# Patient Record
Sex: Female | Born: 1980 | Race: Black or African American | Hispanic: No | Marital: Single | Smoking: Never smoker
Health system: Southern US, Community
[De-identification: ages and names within clinical notes are randomized; demographics above are authoritative.]

## PROBLEM LIST (undated history)

## (undated) ENCOUNTER — Inpatient Hospital Stay (HOSPITAL_COMMUNITY): Payer: Self-pay

## (undated) DIAGNOSIS — Z9889 Other specified postprocedural states: Secondary | ICD-10-CM

## (undated) DIAGNOSIS — R51 Headache: Secondary | ICD-10-CM

## (undated) DIAGNOSIS — Z789 Other specified health status: Secondary | ICD-10-CM

## (undated) DIAGNOSIS — R112 Nausea with vomiting, unspecified: Secondary | ICD-10-CM

## (undated) DIAGNOSIS — A048 Other specified bacterial intestinal infections: Secondary | ICD-10-CM

## (undated) DIAGNOSIS — E538 Deficiency of other specified B group vitamins: Secondary | ICD-10-CM

## (undated) HISTORY — PX: COLONOSCOPY: SHX174

## (undated) HISTORY — DX: Deficiency of other specified B group vitamins: E53.8

## (undated) HISTORY — DX: Other specified bacterial intestinal infections: A04.8

## (undated) HISTORY — PX: MULTIPLE TOOTH EXTRACTIONS: SHX2053

## (undated) HISTORY — PX: ESOPHAGOGASTRODUODENOSCOPY: SHX1529

---
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
## 2002-04-28 ENCOUNTER — Emergency Department (HOSPITAL_COMMUNITY): Admission: EM | Admit: 2002-04-28 | Discharge: 2002-04-28 | Payer: Self-pay | Admitting: *Deleted

## 2002-12-05 ENCOUNTER — Inpatient Hospital Stay (HOSPITAL_COMMUNITY): Admission: AD | Admit: 2002-12-05 | Discharge: 2002-12-05 | Payer: Self-pay | Admitting: *Deleted

## 2004-04-30 ENCOUNTER — Inpatient Hospital Stay (HOSPITAL_COMMUNITY): Admission: AD | Admit: 2004-04-30 | Discharge: 2004-04-30 | Payer: Self-pay | Admitting: Obstetrics and Gynecology

## 2004-05-15 ENCOUNTER — Inpatient Hospital Stay (HOSPITAL_COMMUNITY): Admission: RE | Admit: 2004-05-15 | Discharge: 2004-05-15 | Payer: Self-pay | Admitting: Obstetrics and Gynecology

## 2004-06-17 ENCOUNTER — Ambulatory Visit (HOSPITAL_COMMUNITY): Admission: RE | Admit: 2004-06-17 | Discharge: 2004-06-17 | Payer: Self-pay | Admitting: Obstetrics

## 2004-10-09 ENCOUNTER — Inpatient Hospital Stay (HOSPITAL_COMMUNITY): Admission: AD | Admit: 2004-10-09 | Discharge: 2004-10-09 | Payer: Self-pay | Admitting: Obstetrics

## 2004-11-04 ENCOUNTER — Inpatient Hospital Stay (HOSPITAL_COMMUNITY): Admission: AD | Admit: 2004-11-04 | Discharge: 2004-11-04 | Payer: Self-pay | Admitting: Obstetrics

## 2004-12-06 ENCOUNTER — Emergency Department (HOSPITAL_COMMUNITY): Admission: EM | Admit: 2004-12-06 | Discharge: 2004-12-06 | Payer: Self-pay | Admitting: Emergency Medicine

## 2004-12-25 ENCOUNTER — Inpatient Hospital Stay (HOSPITAL_COMMUNITY): Admission: AD | Admit: 2004-12-25 | Discharge: 2004-12-27 | Payer: Self-pay | Admitting: Obstetrics

## 2006-10-08 ENCOUNTER — Emergency Department (HOSPITAL_COMMUNITY): Admission: EM | Admit: 2006-10-08 | Discharge: 2006-10-08 | Payer: Self-pay | Admitting: Emergency Medicine

## 2008-05-03 ENCOUNTER — Emergency Department (HOSPITAL_COMMUNITY): Admission: EM | Admit: 2008-05-03 | Discharge: 2008-05-03 | Payer: Self-pay | Admitting: Family Medicine

## 2008-12-27 ENCOUNTER — Inpatient Hospital Stay (HOSPITAL_COMMUNITY): Admission: AD | Admit: 2008-12-27 | Discharge: 2008-12-27 | Payer: Self-pay | Admitting: Family Medicine

## 2010-02-22 ENCOUNTER — Inpatient Hospital Stay (HOSPITAL_COMMUNITY): Admission: AD | Admit: 2010-02-22 | Discharge: 2010-02-22 | Payer: Self-pay | Admitting: Obstetrics & Gynecology

## 2010-02-22 ENCOUNTER — Ambulatory Visit: Payer: Self-pay | Admitting: Nurse Practitioner

## 2010-03-20 ENCOUNTER — Ambulatory Visit: Payer: Self-pay | Admitting: Obstetrics & Gynecology

## 2010-04-06 ENCOUNTER — Ambulatory Visit: Payer: Self-pay | Admitting: Nurse Practitioner

## 2010-04-06 ENCOUNTER — Inpatient Hospital Stay (HOSPITAL_COMMUNITY): Admission: AD | Admit: 2010-04-06 | Discharge: 2010-04-06 | Payer: Self-pay | Admitting: Obstetrics and Gynecology

## 2010-05-14 ENCOUNTER — Inpatient Hospital Stay (HOSPITAL_COMMUNITY): Admission: AD | Admit: 2010-05-14 | Discharge: 2010-05-14 | Payer: Self-pay | Admitting: Obstetrics & Gynecology

## 2010-09-29 ENCOUNTER — Inpatient Hospital Stay (HOSPITAL_COMMUNITY)
Admission: AD | Admit: 2010-09-29 | Discharge: 2010-09-30 | Payer: Self-pay | Source: Home / Self Care | Attending: Obstetrics | Admitting: Obstetrics

## 2010-10-01 LAB — URINE CULTURE
Colony Count: NO GROWTH
Culture: NO GROWTH

## 2010-10-01 LAB — URINE MICROSCOPIC-ADD ON

## 2010-10-01 LAB — URINALYSIS, ROUTINE W REFLEX MICROSCOPIC
Bilirubin Urine: NEGATIVE
Hgb urine dipstick: NEGATIVE
Ketones, ur: 15 mg/dL — AB
Nitrite: NEGATIVE
Protein, ur: NEGATIVE mg/dL
Specific Gravity, Urine: 1.015 (ref 1.005–1.030)
Urine Glucose, Fasting: NEGATIVE mg/dL
Urobilinogen, UA: 0.2 mg/dL (ref 0.0–1.0)
pH: 5.5 (ref 5.0–8.0)

## 2010-11-09 ENCOUNTER — Inpatient Hospital Stay (HOSPITAL_COMMUNITY)
Admission: AD | Admit: 2010-11-09 | Discharge: 2010-11-09 | Disposition: A | Discharge status: Home / Self Care | Payer: Medicaid Other | Source: Ambulatory Visit | Attending: Obstetrics | Admitting: Obstetrics

## 2010-11-09 DIAGNOSIS — A499 Bacterial infection, unspecified: Secondary | ICD-10-CM

## 2010-11-09 DIAGNOSIS — N760 Acute vaginitis: Secondary | ICD-10-CM

## 2010-11-09 DIAGNOSIS — N949 Unspecified condition associated with female genital organs and menstrual cycle: Secondary | ICD-10-CM | POA: Insufficient documentation

## 2010-11-09 DIAGNOSIS — O2390 Unspecified genitourinary tract infection in pregnancy, unspecified trimester: Secondary | ICD-10-CM

## 2010-11-09 DIAGNOSIS — B9689 Other specified bacterial agents as the cause of diseases classified elsewhere: Secondary | ICD-10-CM | POA: Insufficient documentation

## 2010-11-09 LAB — URINALYSIS, ROUTINE W REFLEX MICROSCOPIC
Bilirubin Urine: NEGATIVE
Glucose, UA: NEGATIVE mg/dL
Hgb urine dipstick: NEGATIVE
Ketones, ur: NEGATIVE mg/dL
Nitrite: NEGATIVE
Protein, ur: NEGATIVE mg/dL
Specific Gravity, Urine: 1.01 (ref 1.005–1.030)
Urobilinogen, UA: 1 mg/dL (ref 0.0–1.0)
pH: 6 (ref 5.0–8.0)

## 2010-11-09 LAB — URINE MICROSCOPIC-ADD ON

## 2010-11-09 LAB — WET PREP, GENITAL
Trich, Wet Prep: NONE SEEN
Yeast Wet Prep HPF POC: NONE SEEN

## 2010-11-10 LAB — GC/CHLAMYDIA PROBE AMP, GENITAL
Chlamydia, DNA Probe: NEGATIVE
GC Probe Amp, Genital: NEGATIVE

## 2010-11-11 LAB — URINE CULTURE
Colony Count: NO GROWTH
Culture  Setup Time: 201203042113
Culture: NO GROWTH

## 2010-11-13 ENCOUNTER — Inpatient Hospital Stay (HOSPITAL_COMMUNITY)
Admission: AD | Admit: 2010-11-13 | Discharge: 2010-11-13 | Disposition: A | Discharge status: Home / Self Care | Payer: Medicaid Other | Source: Ambulatory Visit | Attending: Obstetrics | Admitting: Obstetrics

## 2010-11-13 DIAGNOSIS — O358XX0 Maternal care for other (suspected) fetal abnormality and damage, not applicable or unspecified: Secondary | ICD-10-CM

## 2010-11-20 LAB — WET PREP, GENITAL
Trich, Wet Prep: NONE SEEN
Yeast Wet Prep HPF POC: NONE SEEN

## 2010-11-20 LAB — URINE MICROSCOPIC-ADD ON

## 2010-11-20 LAB — URINALYSIS, ROUTINE W REFLEX MICROSCOPIC
Bilirubin Urine: NEGATIVE
Glucose, UA: NEGATIVE mg/dL
Hgb urine dipstick: NEGATIVE
Ketones, ur: NEGATIVE mg/dL
Nitrite: NEGATIVE
Protein, ur: NEGATIVE mg/dL
Specific Gravity, Urine: 1.01 (ref 1.005–1.030)
Urobilinogen, UA: 0.2 mg/dL (ref 0.0–1.0)
pH: 6.5 (ref 5.0–8.0)

## 2010-11-20 LAB — GC/CHLAMYDIA PROBE AMP, GENITAL
Chlamydia, DNA Probe: NEGATIVE
GC Probe Amp, Genital: NEGATIVE

## 2010-11-22 ENCOUNTER — Inpatient Hospital Stay (HOSPITAL_COMMUNITY)
Admission: AD | Admit: 2010-11-22 | Discharge: 2010-11-24 | DRG: 775 | Disposition: A | Discharge status: Home / Self Care | Payer: Medicaid Other | Source: Ambulatory Visit | Attending: Obstetrics | Admitting: Obstetrics

## 2010-11-22 ENCOUNTER — Inpatient Hospital Stay (HOSPITAL_COMMUNITY)
Admission: AD | Admit: 2010-11-22 | Discharge: 2010-11-22 | Disposition: A | Discharge status: Home / Self Care | Payer: Medicaid Other | Source: Ambulatory Visit | Attending: Obstetrics | Admitting: Obstetrics

## 2010-11-22 DIAGNOSIS — D649 Anemia, unspecified: Secondary | ICD-10-CM | POA: Diagnosis not present

## 2010-11-22 DIAGNOSIS — O9903 Anemia complicating the puerperium: Secondary | ICD-10-CM | POA: Diagnosis not present

## 2010-11-22 DIAGNOSIS — O479 False labor, unspecified: Secondary | ICD-10-CM | POA: Insufficient documentation

## 2010-11-22 LAB — WET PREP, GENITAL
Trich, Wet Prep: NONE SEEN
Yeast Wet Prep HPF POC: NONE SEEN

## 2010-11-22 LAB — URINALYSIS, ROUTINE W REFLEX MICROSCOPIC
Bilirubin Urine: NEGATIVE
Glucose, UA: NEGATIVE mg/dL
Ketones, ur: NEGATIVE mg/dL
Nitrite: NEGATIVE
Protein, ur: NEGATIVE mg/dL
Specific Gravity, Urine: 1.015 (ref 1.005–1.030)
Urobilinogen, UA: 0.2 mg/dL (ref 0.0–1.0)
pH: 6 (ref 5.0–8.0)

## 2010-11-22 LAB — URINE CULTURE
Colony Count: NO GROWTH
Culture: NO GROWTH

## 2010-11-22 LAB — CBC
MCH: 27.1 pg (ref 26.0–34.0)
MCHC: 32.7 g/dL (ref 30.0–36.0)
MCHC: 32.3 g/dL (ref 30.0–36.0)
Platelets: 289 10*3/uL (ref 150–400)
RBC: 4.16 MIL/uL (ref 3.87–5.11)
RDW: 14.4 % (ref 11.5–15.5)

## 2010-11-22 LAB — GC/CHLAMYDIA PROBE AMP, GENITAL
Chlamydia, DNA Probe: NEGATIVE
GC Probe Amp, Genital: NEGATIVE

## 2010-11-22 LAB — POCT PREGNANCY, URINE: Preg Test, Ur: POSITIVE

## 2010-11-22 LAB — URINE MICROSCOPIC-ADD ON

## 2010-11-22 LAB — HCG, QUANTITATIVE, PREGNANCY: hCG, Beta Chain, Quant, S: 24690 m[IU]/mL — ABNORMAL HIGH (ref <5)

## 2010-11-23 LAB — RPR: RPR Ser Ql: NONREACTIVE

## 2010-11-23 LAB — WET PREP, GENITAL

## 2010-11-23 LAB — POCT PREGNANCY, URINE: Preg Test, Ur: NEGATIVE

## 2010-11-23 LAB — URINALYSIS, ROUTINE W REFLEX MICROSCOPIC
Protein, ur: NEGATIVE mg/dL
Urobilinogen, UA: 0.2 mg/dL (ref 0.0–1.0)

## 2010-11-23 LAB — CBC
Platelets: 276 10*3/uL (ref 150–400)
RBC: 3.56 MIL/uL — ABNORMAL LOW (ref 3.87–5.11)
WBC: 18.1 10*3/uL — ABNORMAL HIGH (ref 4.0–10.5)

## 2010-11-23 LAB — URINE MICROSCOPIC-ADD ON

## 2010-11-23 LAB — ABO/RH: ABO/RH(D): O POS

## 2010-11-26 ENCOUNTER — Inpatient Hospital Stay (HOSPITAL_COMMUNITY)
Admission: AD | Admit: 2010-11-26 | Discharge: 2010-11-26 | Disposition: A | Discharge status: Home / Self Care | Payer: Medicaid Other | Source: Ambulatory Visit | Attending: Obstetrics | Admitting: Obstetrics

## 2010-11-26 ENCOUNTER — Inpatient Hospital Stay (HOSPITAL_COMMUNITY): Payer: Medicaid Other

## 2010-11-26 DIAGNOSIS — R1031 Right lower quadrant pain: Secondary | ICD-10-CM | POA: Insufficient documentation

## 2010-11-26 LAB — DIFFERENTIAL
Basophils Absolute: 0 10*3/uL (ref 0.0–0.1)
Basophils Relative: 0 % (ref 0–1)
Eosinophils Absolute: 0.2 10*3/uL (ref 0.0–0.7)
Eosinophils Relative: 1 % (ref 0–5)
Lymphocytes Relative: 15 % (ref 12–46)

## 2010-11-26 LAB — URINALYSIS, ROUTINE W REFLEX MICROSCOPIC
Ketones, ur: 40 mg/dL — AB
Leukocytes, UA: NEGATIVE
Nitrite: NEGATIVE
Protein, ur: NEGATIVE mg/dL
Urobilinogen, UA: 0.2 mg/dL (ref 0.0–1.0)
pH: 6 (ref 5.0–8.0)

## 2010-11-26 LAB — CBC
MCV: 77.8 fL — ABNORMAL LOW (ref 78.0–100.0)
Platelets: 302 10*3/uL (ref 150–400)
RDW: 14.8 % (ref 11.5–15.5)
WBC: 14.9 10*3/uL — ABNORMAL HIGH (ref 4.0–10.5)

## 2010-11-26 MED ORDER — IOHEXOL 300 MG/ML  SOLN
100.0000 mL | Freq: Once | INTRAMUSCULAR | Status: AC | PRN
  Administered 2010-11-26: 100 mL via INTRAVENOUS

## 2010-11-26 NOTE — H&P (Signed)
  [name], Fitzpatrick               ACCOUNT NO.:  [id number]  MEDICAL RECORD NO.:  [id number]           PATIENT TYPE:  I  LOCATION:  9142                          FACILITY:  [facility]  PHYSICIAN:  Roseanna Rainbow, M.D.DATE OF BIRTH:  [birth year]  DATE OF ADMISSION:  11/22/2010 DATE OF DISCHARGE:                             HISTORY & PHYSICAL   CHIEF COMPLAINT:  The patient is a 30 year old para 1 with an estimated date of confinement of December 01, 2010, complaining of contractions.  HISTORY OF PRESENT ILLNESS:  Please see the above.  She denies rupture of membranes.  SOCIAL HISTORY:  She denies any tobacco or ethanol or drug use.  ALLERGIES:  No known drug allergies.  PAST GYNECOLOGIC HISTORY:  Normal triad.  PAST OBSTETRICAL HISTORY:  In 2006, she has delivered of a live born female, birth weight 6 pounds 12 ounces, full-term vaginal delivery, no complications.  PAST MEDICAL HISTORY:  She denies.  PAST SURGICAL HISTORY:  She denies.  FAMILY HISTORY:  Noncontributory.  ANTEPARTUM COURSE:  Onset of care with Dr. Gaynell Face at 14 weeks.  PRENATAL LABORATORY DATA:  Hemoglobin 10.3, hematocrit 32.2, platelets 310,000.  Blood type O positive.  Antibody screen negative.  Sickle cell trait positive.  RPR nonreactive.  Rubella immune.  Hepatitis B surface antigen negative.  HIV nonreactive.  GC and Chlamydia probes negative. Quad screen negative.  1-hour GTT 130.  GBS negative on November 02, 2010.  Ultrasound on July 11, 2010, at 19 weeks 3 days, posterior placenta.  OB risk factors, sickle cell trait, urinary tract infection.  REVIEW OF SYSTEMS:  GU:  Please see the above.  PHYSICAL EXAMINATION:  VITAL SIGNS:  Stable, afebrile.  Fetal heart tracing baseline, 140 beats per minute.  Moderate long-term variability. Tocodynamometer uterine contractions every 5 minutes. GENERAL:  Moderate distress. ABDOMEN:  Gravid.  Sterile vaginal exam, 4-5 cm dilated, bulging bag of water  with the contraction, 80% effaced, vertex at a 0 station.  The membranes were artificially ruptured for clear fluid.  ASSESSMENT:  Primipara at term, late latent labor.  Category one fetal heart tracing.  PLAN:  Admission, expectant management, epidural.  Anticipate a spontaneous vaginal delivery.     Roseanna Rainbow, M.D.     Sylvia Fitzpatrick  D:  11/22/2010  T:  11/23/2010  Job:  [id number]  cc:   Kathreen Cosier, M.D. Fax: [phone]  Electronically Signed by Antionette Char M.D. on 11/26/2010 09:53:48 PM

## 2010-12-04 ENCOUNTER — Inpatient Hospital Stay (INDEPENDENT_AMBULATORY_CARE_PROVIDER_SITE_OTHER)
Admission: RE | Admit: 2010-12-04 | Discharge: 2010-12-04 | Disposition: A | Discharge status: Home / Self Care | Payer: Medicaid Other | Source: Ambulatory Visit | Attending: Emergency Medicine | Admitting: Emergency Medicine

## 2010-12-04 DIAGNOSIS — L509 Urticaria, unspecified: Secondary | ICD-10-CM

## 2010-12-06 ENCOUNTER — Inpatient Hospital Stay (INDEPENDENT_AMBULATORY_CARE_PROVIDER_SITE_OTHER)
Admission: RE | Admit: 2010-12-06 | Discharge: 2010-12-06 | Disposition: A | Discharge status: Home / Self Care | Payer: Medicaid Other | Source: Ambulatory Visit | Attending: Family Medicine | Admitting: Family Medicine

## 2010-12-06 DIAGNOSIS — M79609 Pain in unspecified limb: Secondary | ICD-10-CM

## 2010-12-06 DIAGNOSIS — J029 Acute pharyngitis, unspecified: Secondary | ICD-10-CM

## 2010-12-12 ENCOUNTER — Emergency Department (HOSPITAL_COMMUNITY)
Admission: EM | Admit: 2010-12-12 | Discharge: 2010-12-12 | Disposition: A | Discharge status: Home / Self Care | Payer: Medicaid Other | Attending: Emergency Medicine | Admitting: Emergency Medicine

## 2010-12-12 DIAGNOSIS — M7989 Other specified soft tissue disorders: Secondary | ICD-10-CM | POA: Insufficient documentation

## 2010-12-12 DIAGNOSIS — M2550 Pain in unspecified joint: Secondary | ICD-10-CM | POA: Insufficient documentation

## 2010-12-12 DIAGNOSIS — M25473 Effusion, unspecified ankle: Secondary | ICD-10-CM | POA: Insufficient documentation

## 2010-12-12 DIAGNOSIS — M25476 Effusion, unspecified foot: Secondary | ICD-10-CM | POA: Insufficient documentation

## 2010-12-12 DIAGNOSIS — R070 Pain in throat: Secondary | ICD-10-CM | POA: Insufficient documentation

## 2010-12-12 LAB — CBC
HCT: 30.3 % — ABNORMAL LOW (ref 36.0–46.0)
MCH: 24.8 pg — ABNORMAL LOW (ref 26.0–34.0)
MCV: 75 fL — ABNORMAL LOW (ref 78.0–100.0)
Platelets: 453 10*3/uL — ABNORMAL HIGH (ref 150–400)
RDW: 15.5 % (ref 11.5–15.5)

## 2010-12-12 LAB — URINALYSIS, ROUTINE W REFLEX MICROSCOPIC
Glucose, UA: NEGATIVE mg/dL
Protein, ur: NEGATIVE mg/dL
pH: 7.5 (ref 5.0–8.0)

## 2010-12-12 LAB — POCT I-STAT, CHEM 8
BUN: 8 mg/dL (ref 6–23)
Calcium, Ion: 1.11 mmol/L — ABNORMAL LOW (ref 1.12–1.32)
Chloride: 99 mEq/L (ref 96–112)
Creatinine, Ser: 0.9 mg/dL (ref 0.4–1.2)
Glucose, Bld: 102 mg/dL — ABNORMAL HIGH (ref 70–99)

## 2010-12-12 LAB — URINE MICROSCOPIC-ADD ON

## 2010-12-12 LAB — DIFFERENTIAL
Eosinophils Absolute: 0.2 10*3/uL (ref 0.0–0.7)
Eosinophils Relative: 1 % (ref 0–5)
Lymphocytes Relative: 17 % (ref 12–46)
Lymphs Abs: 2.3 10*3/uL (ref 0.7–4.0)
Monocytes Relative: 7 % (ref 3–12)

## 2010-12-13 LAB — ANTISTREPTOLYSIN O TITER: ASO: 95 IU/mL (ref 0–116)

## 2010-12-17 LAB — URINALYSIS, ROUTINE W REFLEX MICROSCOPIC
Bilirubin Urine: NEGATIVE
Glucose, UA: NEGATIVE mg/dL
Ketones, ur: NEGATIVE mg/dL
Nitrite: NEGATIVE
Specific Gravity, Urine: 1.015 (ref 1.005–1.030)
pH: 6 (ref 5.0–8.0)

## 2010-12-17 LAB — URINE CULTURE: Colony Count: >=100000

## 2010-12-17 LAB — WET PREP, GENITAL: Trich, Wet Prep: NONE SEEN

## 2010-12-17 LAB — GC/CHLAMYDIA PROBE AMP, GENITAL
Chlamydia, DNA Probe: NEGATIVE
GC Probe Amp, Genital: NEGATIVE

## 2010-12-17 LAB — URINE MICROSCOPIC-ADD ON

## 2011-05-06 ENCOUNTER — Encounter: Payer: Self-pay | Admitting: Obstetrics

## 2011-05-28 ENCOUNTER — Inpatient Hospital Stay (INDEPENDENT_AMBULATORY_CARE_PROVIDER_SITE_OTHER)
Admission: RE | Admit: 2011-05-28 | Discharge: 2011-05-28 | Disposition: A | Discharge status: Home / Self Care | Payer: Self-pay | Source: Ambulatory Visit | Attending: Family Medicine | Admitting: Family Medicine

## 2011-05-28 DIAGNOSIS — R6889 Other general symptoms and signs: Secondary | ICD-10-CM

## 2011-05-28 LAB — POCT URINALYSIS DIP (DEVICE)
Bilirubin Urine: NEGATIVE
Nitrite: NEGATIVE
Protein, ur: NEGATIVE mg/dL
pH: 7.5 (ref 5.0–8.0)

## 2011-05-28 LAB — POCT PREGNANCY, URINE: Preg Test, Ur: NEGATIVE

## 2011-05-30 LAB — URINE CULTURE

## 2011-09-09 ENCOUNTER — Encounter (HOSPITAL_COMMUNITY): Payer: Self-pay | Admitting: *Deleted

## 2011-09-09 ENCOUNTER — Inpatient Hospital Stay (HOSPITAL_COMMUNITY)
Admission: AD | Admit: 2011-09-09 | Discharge: 2011-09-09 | Disposition: A | Discharge status: Home / Self Care | Payer: Self-pay | Source: Ambulatory Visit | Attending: Obstetrics and Gynecology | Admitting: Obstetrics and Gynecology

## 2011-09-09 DIAGNOSIS — N949 Unspecified condition associated with female genital organs and menstrual cycle: Secondary | ICD-10-CM | POA: Insufficient documentation

## 2011-09-09 DIAGNOSIS — N926 Irregular menstruation, unspecified: Secondary | ICD-10-CM

## 2011-09-09 HISTORY — DX: Other specified health status: Z78.9

## 2011-09-09 LAB — URINALYSIS, ROUTINE W REFLEX MICROSCOPIC
Nitrite: NEGATIVE
Specific Gravity, Urine: 1.015 (ref 1.005–1.030)
Urobilinogen, UA: 0.2 mg/dL (ref 0.0–1.0)
pH: 6 (ref 5.0–8.0)

## 2011-09-09 LAB — URINE MICROSCOPIC-ADD ON

## 2011-09-09 NOTE — Progress Notes (Signed)
Patient states she has had BV in the past. Started having a vaginal discharge with odor and an odor to her urine on 12-28. Patient denies any pain.

## 2011-09-09 NOTE — ED Provider Notes (Signed)
History     Chief Complaint  Patient presents with  . Vaginal Discharge   HPI Sylvia Fitzpatrick 31 y.o. LMP 09-07-11.  Comes to MAU today as her urine has an odor.  Is bleeding today.  Denies abdominal pain and dysuria.  Depo was due in December but did not get it again as she "is not doing anything right now".  OB History    Grav Para Term Preterm Abortions TAB SAB Ect Mult Living   2 2 2  0 0 0 0 0 0 2      Past Medical History  Diagnosis Date  . No pertinent past medical history     Past Surgical History  Procedure Date  . No past surgeries     History reviewed. No pertinent family history.  History  Substance Use Topics  . Smoking status: Never Smoker   . Smokeless tobacco: Not on file  . Alcohol Use: No    Allergies: No Known Allergies  Prescriptions prior to admission  Medication Sig Dispense Refill  . Multiple Vitamin (MULITIVITAMIN WITH MINERALS) TABS Take 1 tablet by mouth daily.          Review of Systems  Gastrointestinal: Negative for abdominal pain.  Genitourinary: Negative for dysuria.       Odor in urine Vaginal bleeding - on menses   Physical Exam   Blood pressure 105/70, pulse 79, temperature 98.7 F (37.1 C), temperature source Oral, resp. rate 16, height 5' 8.5" (1.74 m), weight 160 lb (72.576 kg), last menstrual period 09/07/2011, SpO2 99.00%.  Physical Exam  Nursing note and vitals reviewed. Constitutional: She is oriented to person, place, and time. She appears well-developed and well-nourished.  HENT:  Head: Normocephalic.  Eyes: EOM are normal.  Neck: Neck supple.  GI: Soft. There is no tenderness.  Genitourinary:       Speculum exam: Vagina - Small amount of dark blood noted, no odor Cervix - No contact bleeding Bimanual exam: Cervix closed Uterus non tender, normal size Adnexa non tender, no masses bilaterally No tenderness over bladder GC/Chlam, wet prep done Chaperone present for exam.  Musculoskeletal: Normal range of  motion.  Neurological: She is alert and oriented to person, place, and time.  Skin: Skin is warm and dry.  Psychiatric: She has a normal mood and affect.    MAU Course  Procedures  MDM Results for orders placed during the hospital encounter of 09/09/11 (from the past 24 hour(s))  URINALYSIS, ROUTINE W REFLEX MICROSCOPIC     Status: Abnormal   Collection Time   09/09/11  3:55 PM      Component Value Range   Color, Urine YELLOW  YELLOW    APPearance CLEAR  CLEAR    Specific Gravity, Urine 1.015  1.005 - 1.030    pH 6.0  5.0 - 8.0    Glucose, UA NEGATIVE  NEGATIVE (mg/dL)   Hgb urine dipstick LARGE (*) NEGATIVE    Bilirubin Urine NEGATIVE  NEGATIVE    Ketones, ur NEGATIVE  NEGATIVE (mg/dL)   Protein, ur NEGATIVE  NEGATIVE (mg/dL)   Urobilinogen, UA 0.2  0.0 - 1.0 (mg/dL)   Nitrite NEGATIVE  NEGATIVE    Leukocytes, UA TRACE (*) NEGATIVE   URINE MICROSCOPIC-ADD ON     Status: Abnormal   Collection Time   09/09/11  3:55 PM      Component Value Range   Squamous Epithelial / LPF FEW (*) RARE    WBC, UA 7-10  <3 (WBC/hpf)  RBC / HPF 3-6  <3 (RBC/hpf)   Bacteria, UA MANY (*) RARE   WET PREP, GENITAL     Status: Normal   Collection Time   09/09/11  5:17 PM      Component Value Range   Yeast, Wet Prep NONE SEEN  NONE SEEN    Trich, Wet Prep NONE SEEN  NONE SEEN    Clue Cells, Wet Prep NONE SEEN  NONE SEEN    WBC, Wet Prep HPF POC NONE SEEN  NONE SEEN      Assessment and Plan  On menses No UTI, No vaginal infection  Plan Drink at least 8 8-oz glasses of water every day. Condoms always for contraception if having intercourse Follow up with the [facility] Dept as needed.  Caillou Minus 09/09/2011, 5:21 PM   Nolene Bernheim, NP 09/09/11 1800

## 2011-09-09 NOTE — Progress Notes (Signed)
Pt in c/o odor with urine, denies any other uti symptoms.  Denies any abnormal discharge.  Denies any pain.

## 2011-09-10 LAB — GC/CHLAMYDIA PROBE AMP, GENITAL
Chlamydia, DNA Probe: NEGATIVE
GC Probe Amp, Genital: NEGATIVE

## 2011-09-14 NOTE — ED Provider Notes (Signed)
Agree with above note.  Sylvia Fitzpatrick 09/14/2011 10:13 AM   

## 2012-06-18 ENCOUNTER — Emergency Department (HOSPITAL_COMMUNITY)
Admission: EM | Admit: 2012-06-18 | Discharge: 2012-06-18 | Disposition: A | Discharge status: Home / Self Care | Payer: Self-pay | Attending: Emergency Medicine | Admitting: Emergency Medicine

## 2012-06-18 ENCOUNTER — Encounter (HOSPITAL_COMMUNITY): Payer: Self-pay | Admitting: Emergency Medicine

## 2012-06-18 DIAGNOSIS — A499 Bacterial infection, unspecified: Secondary | ICD-10-CM | POA: Insufficient documentation

## 2012-06-18 DIAGNOSIS — B9689 Other specified bacterial agents as the cause of diseases classified elsewhere: Secondary | ICD-10-CM | POA: Insufficient documentation

## 2012-06-18 DIAGNOSIS — Z202 Contact with and (suspected) exposure to infections with a predominantly sexual mode of transmission: Secondary | ICD-10-CM | POA: Insufficient documentation

## 2012-06-18 DIAGNOSIS — N390 Urinary tract infection, site not specified: Secondary | ICD-10-CM | POA: Insufficient documentation

## 2012-06-18 DIAGNOSIS — N760 Acute vaginitis: Secondary | ICD-10-CM | POA: Insufficient documentation

## 2012-06-18 LAB — WET PREP, GENITAL

## 2012-06-18 LAB — URINALYSIS, ROUTINE W REFLEX MICROSCOPIC
Bilirubin Urine: NEGATIVE
Nitrite: NEGATIVE
Specific Gravity, Urine: 1.015 (ref 1.005–1.030)
Urobilinogen, UA: 1 mg/dL (ref 0.0–1.0)

## 2012-06-18 LAB — POCT PREGNANCY, URINE: Preg Test, Ur: NEGATIVE

## 2012-06-18 MED ORDER — CEFTRIAXONE SODIUM 250 MG IJ SOLR
250.0000 mg | Freq: Once | INTRAMUSCULAR | Status: AC
  Administered 2012-06-18: 250 mg via INTRAMUSCULAR
  Filled 2012-06-18: qty 250

## 2012-06-18 MED ORDER — AZITHROMYCIN 1 G PO PACK
1.0000 g | PACK | Freq: Once | ORAL | Status: AC
  Administered 2012-06-18: 1 g via ORAL
  Filled 2012-06-18: qty 1

## 2012-06-18 MED ORDER — SULFAMETHOXAZOLE-TRIMETHOPRIM 800-160 MG PO TABS: 1.0000 | ORAL_TABLET | Freq: Two times a day (BID) | ORAL | Status: DC

## 2012-06-18 MED ORDER — METRONIDAZOLE 500 MG PO TABS: 500.0000 mg | ORAL_TABLET | Freq: Two times a day (BID) | ORAL | Status: DC
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
## 2012-06-18 NOTE — ED Provider Notes (Signed)
History     CSN: [id number]  Arrival date & time 06/18/12  4098   First MD Initiated Contact with Patient 06/18/12 0735      Chief Complaint  Patient presents with  . Vaginal Discharge    (Consider location/radiation/quality/duration/timing/severity/associated sxs/prior treatment) Patient is a 31 y.o. female presenting with vaginal discharge. The history is provided by the patient.  Vaginal Discharge   patient complains of vaginal discharge x2 days. States that her female partner has not been using condoms. She is concerned that she may have an ICD. Denies any fever or dysuria. No abdominal pain. No treatment used prior to arrival. No prior history of STDs.  Past Medical History  Diagnosis Date  . No pertinent past medical history     Past Surgical History  Procedure Date  . No past surgeries     History reviewed. No pertinent family history.  History  Substance Use Topics  . Smoking status: Never Smoker   . Smokeless tobacco: Not on file  . Alcohol Use: No    OB History    Grav Para Term Preterm Abortions TAB SAB Ect Mult Living   2 2 2  0 0 0 0 0 0 2      Review of Systems  Genitourinary: Positive for vaginal discharge.  All other systems reviewed and are negative.    Allergies  Review of patient's allergies indicates no known allergies.  Home Medications   Current Outpatient Rx  Name Route Sig Dispense Refill  . ADULT MULTIVITAMIN W/MINERALS CH Oral Take 1 tablet by mouth daily.        BP 115/72  Pulse 65  Temp 98.2 F (36.8 C) (Oral)  Resp 16  Ht 5\' 9"  (1.753 m)  LMP 06/09/2012  Physical Exam  Nursing note and vitals reviewed. Constitutional: She is oriented to person, place, and time. She appears well-developed and well-nourished.  Non-toxic appearance. No distress.  HENT:  Head: Normocephalic and atraumatic.  Eyes: Conjunctivae normal, EOM and lids are normal. Pupils are equal, round, and reactive to light.  Neck: Normal range of motion.  Neck supple. No tracheal deviation present. No mass present.  Cardiovascular: Normal rate, regular rhythm and normal heart sounds.  Exam reveals no gallop.   No murmur heard. Pulmonary/Chest: Effort normal and breath sounds normal. No stridor. No respiratory distress. She has no decreased breath sounds. She has no wheezes. She has no rhonchi. She has no rales.  Abdominal: Soft. Normal appearance and bowel sounds are normal. She exhibits no distension. There is no tenderness. There is no rigidity, no rebound, no guarding and no CVA tenderness.  Genitourinary: Cervix exhibits discharge. Cervix exhibits no motion tenderness.  Musculoskeletal: Normal range of motion. She exhibits no edema and no tenderness.  Neurological: She is alert and oriented to person, place, and time. She has normal strength. No cranial nerve deficit or sensory deficit. GCS eye subscore is 4. GCS verbal subscore is 5. GCS motor subscore is 6.  Skin: Skin is warm and dry. No abrasion and no rash noted.  Psychiatric: She has a normal mood and affect. Her speech is normal and behavior is normal.    ED Course  Procedures (including critical care time)   Labs Reviewed  URINALYSIS, ROUTINE W REFLEX MICROSCOPIC  GC/CHLAMYDIA PROBE AMP, GENITAL  WET PREP, GENITAL  URINE CULTURE   No results found.   No diagnosis found.    MDM  Pt to be tx for bv, uti and pid  Toy Baker, MD 06/18/12 [phone]

## 2012-06-20 LAB — GC/CHLAMYDIA PROBE AMP, GENITAL
Chlamydia, DNA Probe: NEGATIVE
GC Probe Amp, Genital: NEGATIVE

## 2012-06-20 LAB — URINE CULTURE

## 2012-06-21 NOTE — ED Notes (Signed)
+   Urine No sensitivity provided. Chart sent to EDP office for review.

## 2012-06-22 NOTE — ED Notes (Signed)
Notes reviewed by Trixie Dredge. No further treatment needed.

## 2013-06-11 ENCOUNTER — Inpatient Hospital Stay (HOSPITAL_COMMUNITY)
Admission: AD | Admit: 2013-06-11 | Discharge: 2013-06-11 | Disposition: A | Discharge status: Home / Self Care | Payer: 59 | Source: Ambulatory Visit | Attending: Obstetrics & Gynecology | Admitting: Obstetrics & Gynecology

## 2013-06-11 ENCOUNTER — Inpatient Hospital Stay (HOSPITAL_COMMUNITY): Payer: 59

## 2013-06-11 ENCOUNTER — Encounter (HOSPITAL_COMMUNITY): Payer: Self-pay

## 2013-06-11 DIAGNOSIS — M549 Dorsalgia, unspecified: Secondary | ICD-10-CM | POA: Diagnosis not present

## 2013-06-11 DIAGNOSIS — N949 Unspecified condition associated with female genital organs and menstrual cycle: Secondary | ICD-10-CM | POA: Diagnosis not present

## 2013-06-11 DIAGNOSIS — R109 Unspecified abdominal pain: Secondary | ICD-10-CM | POA: Diagnosis present

## 2013-06-11 DIAGNOSIS — O9989 Other specified diseases and conditions complicating pregnancy, childbirth and the puerperium: Secondary | ICD-10-CM

## 2013-06-11 DIAGNOSIS — O99891 Other specified diseases and conditions complicating pregnancy: Secondary | ICD-10-CM | POA: Insufficient documentation

## 2013-06-11 HISTORY — DX: Headache: R51

## 2013-06-11 HISTORY — DX: Other specified postprocedural states: Z98.890

## 2013-06-11 HISTORY — DX: Nausea with vomiting, unspecified: R11.2

## 2013-06-11 LAB — URINALYSIS, ROUTINE W REFLEX MICROSCOPIC
Glucose, UA: NEGATIVE mg/dL
Specific Gravity, Urine: 1.015 (ref 1.005–1.030)
pH: 6 (ref 5.0–8.0)

## 2013-06-11 LAB — OB RESULTS CONSOLE GC/CHLAMYDIA
CHLAMYDIA, DNA PROBE: NEGATIVE
Gonorrhea: NEGATIVE

## 2013-06-11 LAB — CBC
MCH: 26.4 pg (ref 26.0–34.0)
Platelets: 306 10*3/uL (ref 150–400)
RBC: 4.01 MIL/uL (ref 3.87–5.11)
WBC: 9.8 10*3/uL (ref 4.0–10.5)

## 2013-06-11 LAB — WET PREP, GENITAL: Clue Cells Wet Prep HPF POC: NONE SEEN

## 2013-06-11 LAB — HCG, QUANTITATIVE, PREGNANCY: hCG, Beta Chain, Quant, S: 6551 m[IU]/mL — ABNORMAL HIGH (ref <5)

## 2013-06-11 LAB — POCT PREGNANCY, URINE: Preg Test, Ur: POSITIVE — AB

## 2013-06-11 MED ORDER — CYCLOBENZAPRINE HCL 10 MG PO TABS: 10.0000 mg | ORAL_TABLET | Freq: Three times a day (TID) | ORAL | Status: DC | PRN

## 2013-06-11 MED ORDER — ACETAMINOPHEN 500 MG PO TABS
1000.0000 mg | ORAL_TABLET | Freq: Once | ORAL | Status: AC
  Administered 2013-06-11: 1000 mg via ORAL
  Filled 2013-06-11: qty 2

## 2013-06-11 NOTE — MAU Note (Signed)
Pt states minimal pain last week, however pain more noticeable since yesterday. Pain is intermittent. Denies bleeding or vag d/c changes.
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
## 2013-06-28 ENCOUNTER — Encounter: Payer: Medicaid Other | Admitting: Obstetrics & Gynecology

## 2013-07-06 ENCOUNTER — Emergency Department (HOSPITAL_COMMUNITY)
Admission: EM | Admit: 2013-07-06 | Discharge: 2013-07-06 | Disposition: A | Discharge status: Home / Self Care | Payer: No Typology Code available for payment source | Attending: Emergency Medicine | Admitting: Emergency Medicine

## 2013-07-06 ENCOUNTER — Encounter (HOSPITAL_COMMUNITY): Payer: Self-pay | Admitting: Emergency Medicine

## 2013-07-06 DIAGNOSIS — Z8669 Personal history of other diseases of the nervous system and sense organs: Secondary | ICD-10-CM | POA: Insufficient documentation

## 2013-07-06 DIAGNOSIS — M549 Dorsalgia, unspecified: Secondary | ICD-10-CM

## 2013-07-06 DIAGNOSIS — Z79899 Other long term (current) drug therapy: Secondary | ICD-10-CM | POA: Insufficient documentation

## 2013-07-06 DIAGNOSIS — S3981XA Other specified injuries of abdomen, initial encounter: Secondary | ICD-10-CM | POA: Insufficient documentation

## 2013-07-06 DIAGNOSIS — IMO0002 Reserved for concepts with insufficient information to code with codable children: Secondary | ICD-10-CM | POA: Insufficient documentation

## 2013-07-06 DIAGNOSIS — Y9389 Activity, other specified: Secondary | ICD-10-CM | POA: Insufficient documentation

## 2013-07-06 DIAGNOSIS — Y92410 Unspecified street and highway as the place of occurrence of the external cause: Secondary | ICD-10-CM | POA: Insufficient documentation

## 2013-07-06 DIAGNOSIS — O9989 Other specified diseases and conditions complicating pregnancy, childbirth and the puerperium: Secondary | ICD-10-CM | POA: Insufficient documentation

## 2013-07-06 MED ORDER — ACETAMINOPHEN 325 MG PO TABS
650.0000 mg | ORAL_TABLET | Freq: Once | ORAL | Status: AC
  Administered 2013-07-06: 650 mg via ORAL
  Filled 2013-07-06: qty 2
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
## 2013-07-12 ENCOUNTER — Encounter (HOSPITAL_COMMUNITY): Payer: Self-pay | Admitting: Family

## 2013-07-12 ENCOUNTER — Inpatient Hospital Stay (HOSPITAL_COMMUNITY): Payer: No Typology Code available for payment source

## 2013-07-12 ENCOUNTER — Inpatient Hospital Stay (HOSPITAL_COMMUNITY)
Admission: AD | Admit: 2013-07-12 | Discharge: 2013-07-12 | Disposition: A | Discharge status: Home / Self Care | Payer: No Typology Code available for payment source | Source: Ambulatory Visit | Attending: Obstetrics & Gynecology | Admitting: Obstetrics & Gynecology

## 2013-07-12 DIAGNOSIS — R0602 Shortness of breath: Secondary | ICD-10-CM | POA: Insufficient documentation

## 2013-07-12 DIAGNOSIS — O99891 Other specified diseases and conditions complicating pregnancy: Secondary | ICD-10-CM | POA: Insufficient documentation

## 2013-07-12 DIAGNOSIS — M545 Low back pain, unspecified: Secondary | ICD-10-CM

## 2013-07-12 DIAGNOSIS — IMO0001 Reserved for inherently not codable concepts without codable children: Secondary | ICD-10-CM | POA: Insufficient documentation

## 2013-07-12 DIAGNOSIS — M549 Dorsalgia, unspecified: Secondary | ICD-10-CM | POA: Insufficient documentation

## 2013-07-12 DIAGNOSIS — M7918 Myalgia, other site: Secondary | ICD-10-CM

## 2013-07-12 DIAGNOSIS — R1084 Generalized abdominal pain: Secondary | ICD-10-CM

## 2013-07-12 DIAGNOSIS — R1032 Left lower quadrant pain: Secondary | ICD-10-CM | POA: Insufficient documentation

## 2013-07-12 LAB — URINALYSIS, ROUTINE W REFLEX MICROSCOPIC
Bilirubin Urine: NEGATIVE
Nitrite: NEGATIVE
Specific Gravity, Urine: 1.025 (ref 1.005–1.030)
Urobilinogen, UA: 0.2 mg/dL (ref 0.0–1.0)

## 2013-07-12 LAB — URINE MICROSCOPIC-ADD ON

## 2013-07-12 MED ORDER — OXYCODONE-ACETAMINOPHEN 5-325 MG PO TABS: 1.0000 | ORAL_TABLET | Freq: Four times a day (QID) | ORAL | Status: DC | PRN
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
## 2013-07-12 NOTE — MAU Note (Signed)
Feels short of breath. No pain when taking a deep breath.

## 2013-07-21 ENCOUNTER — Encounter (HOSPITAL_COMMUNITY): Payer: Self-pay

## 2013-07-21 ENCOUNTER — Inpatient Hospital Stay (HOSPITAL_COMMUNITY)
Admission: AD | Admit: 2013-07-21 | Discharge: 2013-07-21 | Disposition: A | Discharge status: Home / Self Care | Payer: 59 | Source: Ambulatory Visit | Attending: Obstetrics and Gynecology | Admitting: Obstetrics and Gynecology

## 2013-07-21 DIAGNOSIS — R51 Headache: Secondary | ICD-10-CM | POA: Insufficient documentation

## 2013-07-21 DIAGNOSIS — R519 Headache, unspecified: Secondary | ICD-10-CM

## 2013-07-21 DIAGNOSIS — R42 Dizziness and giddiness: Secondary | ICD-10-CM | POA: Insufficient documentation

## 2013-07-21 DIAGNOSIS — O99891 Other specified diseases and conditions complicating pregnancy: Secondary | ICD-10-CM | POA: Insufficient documentation

## 2013-07-21 DIAGNOSIS — O210 Mild hyperemesis gravidarum: Secondary | ICD-10-CM | POA: Insufficient documentation

## 2013-07-21 DIAGNOSIS — O219 Vomiting of pregnancy, unspecified: Secondary | ICD-10-CM

## 2013-07-21 DIAGNOSIS — O26891 Other specified pregnancy related conditions, first trimester: Secondary | ICD-10-CM

## 2013-07-21 LAB — URINE MICROSCOPIC-ADD ON

## 2013-07-21 LAB — URINALYSIS, ROUTINE W REFLEX MICROSCOPIC
Bilirubin Urine: NEGATIVE
Hgb urine dipstick: NEGATIVE
Specific Gravity, Urine: 1.01 (ref 1.005–1.030)
Urobilinogen, UA: 0.2 mg/dL (ref 0.0–1.0)

## 2013-07-21 LAB — GLUCOSE, CAPILLARY: Glucose-Capillary: 74 mg/dL (ref 70–99)

## 2013-07-21 MED ORDER — ONDANSETRON 4 MG PO TBDP: 4.0000 mg | ORAL_TABLET | Freq: Three times a day (TID) | ORAL | Status: DC | PRN

## 2013-07-21 MED ORDER — ONDANSETRON 4 MG PO TBDP
4.0000 mg | ORAL_TABLET | Freq: Once | ORAL | Status: AC
  Administered 2013-07-21: 4 mg via ORAL
  Filled 2013-07-21: qty 1

## 2013-07-21 MED ORDER — ACETAMINOPHEN 500 MG PO TABS
1000.0000 mg | ORAL_TABLET | Freq: Once | ORAL | Status: AC
  Administered 2013-07-21: 1000 mg via ORAL
  Filled 2013-07-21: qty 2

## 2013-07-21 MED ORDER — METOCLOPRAMIDE HCL 10 MG PO TABS: 10.0000 mg | ORAL_TABLET | Freq: Once | ORAL | Status: DC

## 2013-07-21 NOTE — MAU Provider Note (Signed)
Attestation of Attending Supervision of Advanced Practitioner (CNM/NP): Evaluation and management procedures were performed by the Advanced Practitioner under my supervision and collaboration.  I have reviewed the Advanced Practitioner's note and chart, and I agree with the management and plan.  Colden Samaras 07/21/2013 2:45 PM

## 2013-07-21 NOTE — MAU Note (Signed)
Pt states since 0630 has been feeling very light headed and nauseated. Has headache on left side only, making eye twitch. Denies bleeding or vag d/c changes. Had not passed out.

## 2013-07-21 NOTE — MAU Provider Note (Signed)
History     CSN: [id number]  Arrival date and time: 07/21/13 1001   First Provider Initiated Contact with Patient 07/21/13 1118      No chief complaint on file.  HPI  [name] is a 32 y.o. female G3P2002 at [redacted]w[redacted]d who presents with nausea, dizziness and HA that started this morning at 0630. She has not taken anything for the HA or the nausea. 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 She does not drink much fluid during the day. She felt like she needed to eat something, however felt nauseated and didn't.  She currently rates her HA pain a 5/10; pt is awake and texting on her cell phone. Pt has a history of migraine headaches. She states that her left eye has been twitching off and off. She does not have any pain in her left eye, denies history of seizures, denies eye twitching in the past.   OB History   Grav Para Term Preterm Abortions TAB SAB Ect Mult Living   3 2 2  0 0 0 0 0 0 2      Past Medical History  Diagnosis Date  . No pertinent past medical history   . Headache(784.0)     Prior hx migraines  . Complication of anesthesia   . PONV (postoperative nausea and vomiting)     n/v after iv anesthetic    Past Surgical History  Procedure Laterality Date  . Multiple tooth extractions      History reviewed. No pertinent family history.  History  Substance Use Topics  . Smoking status: Never Smoker   . Smokeless tobacco: Never Used  . Alcohol Use: No    Allergies: No Known Allergies  Prescriptions prior to admission  Medication Sig Dispense Refill  . Prenatal Vit-Fe Fumarate-FA (MULTIVITAMIN-PRENATAL) 27-0.8 MG TABS tablet Take 1 tablet by mouth daily at 12 noon.       Results for orders placed during the hospital encounter of 07/21/13 (from the past 24 hour(s))  URINALYSIS, ROUTINE W REFLEX MICROSCOPIC     Status: Abnormal   Collection Time    07/21/13 10:41 AM      Result Value Range   Color, Urine YELLOW   YELLOW   APPearance CLEAR  CLEAR   Specific Gravity, Urine 1.010  1.005 - 1.030   pH 6.0  5.0 - 8.0   Glucose, UA NEGATIVE  NEGATIVE mg/dL   Hgb urine dipstick NEGATIVE  NEGATIVE   Bilirubin Urine NEGATIVE  NEGATIVE   Ketones, ur NEGATIVE  NEGATIVE mg/dL   Protein, ur NEGATIVE  NEGATIVE mg/dL   Urobilinogen, UA 0.2  0.0 - 1.0 mg/dL   Nitrite NEGATIVE  NEGATIVE   Leukocytes, UA TRACE (*) NEGATIVE  URINE MICROSCOPIC-ADD ON     Status: Abnormal   Collection Time    07/21/13 10:41 AM      Result Value Range   Squamous Epithelial / LPF FEW (*) RARE   WBC, UA 0-2  <3 WBC/hpf   Bacteria, UA RARE  RARE  GLUCOSE, CAPILLARY     Status: None   Collection Time    07/21/13 11:53 AM      Result Value Range   Glucose-Capillary 74  70 - 99 mg/dL    Review of Systems  Constitutional: Negative for fever and chills.  Gastrointestinal: Positive for nausea and vomiting. Negative for abdominal pain, diarrhea and constipation.  Neurological: Positive for weakness and headaches. Negative  for dizziness.   Physical Exam   Blood pressure 111/69, pulse 75, temperature 98.1 F (36.7 C), temperature source Oral, resp. rate 20, last menstrual period 05/12/2013, SpO2 100.00%. Fetal heart tones by doppler 161 bpm   Physical Exam  Constitutional: She is oriented to person, place, and time. She appears well-developed and well-nourished.  HENT:  Head: Normocephalic.  Eyes: Pupils are equal, round, and reactive to light. Right eye exhibits no discharge. Left eye exhibits no discharge. Left conjunctiva has no hemorrhage. Left eye exhibits normal extraocular motion and no nystagmus.  Occasional muscle spasm of left eye   Neck: Normal range of motion. Neck supple.  Respiratory: Effort normal.  Musculoskeletal: Normal range of motion.  Neurological: She is alert and oriented to person, place, and time. She has normal strength. GCS eye subscore is 4. GCS verbal subscore is 5. GCS motor subscore is 6.  Skin:  Skin is warm.  Psychiatric: Her behavior is normal.    MAU Course  Procedures None  MDM + fht  CBG Zofran ODT Tylenol  Pt rates her headache pain 3/10 following medication   Assessment and Plan  A:  1. Nausea and vomiting in pregnancy   2. Headache in pregnancy, antepartum, first trimester    P: Discharge home RX: Zofran Return to MAU as needed, if symptoms worsen Ok to take tylenol as directed on the bottle.  Follow up with the clinic as scheduled.   Darica Goren IRENE NP  07/21/2013, 1:25 PM

## 2013-07-25 ENCOUNTER — Encounter: Payer: Self-pay | Admitting: Obstetrics and Gynecology

## 2013-07-25 ENCOUNTER — Ambulatory Visit (INDEPENDENT_AMBULATORY_CARE_PROVIDER_SITE_OTHER): Payer: 59 | Admitting: Obstetrics and Gynecology

## 2013-07-25 VITALS — BP 108/67 | Temp 97.5°F

## 2013-07-25 DIAGNOSIS — Z3481 Encounter for supervision of other normal pregnancy, first trimester: Secondary | ICD-10-CM

## 2013-07-25 DIAGNOSIS — Z3480 Encounter for supervision of other normal pregnancy, unspecified trimester: Secondary | ICD-10-CM | POA: Insufficient documentation

## 2013-07-25 LAB — POCT URINALYSIS DIP (DEVICE)
Bilirubin Urine: NEGATIVE
Glucose, UA: NEGATIVE mg/dL
Ketones, ur: NEGATIVE mg/dL
Specific Gravity, Urine: 1.025 (ref 1.005–1.030)
Urobilinogen, UA: 0.2 mg/dL (ref 0.0–1.0)

## 2013-07-25 NOTE — Addendum Note (Signed)
Addended by: Franchot Mimes on: 07/25/2013 10:32 AM   Modules accepted: Orders

## 2013-07-25 NOTE — Progress Notes (Signed)
   Subjective:    NYELLA ECKELS is a [id number] [redacted]w[redacted]d being seen today for her first obstetrical visit.  Her obstetrical history is significant for 2 previous full term pregnancies. Patient does intend to breast feed. Pregnancy history fully reviewed.  Patient reports no complaints.  Filed Vitals:   07/25/13 0815  BP: 108/67  Temp: 97.5 F (36.4 C)    HISTORY: OB History  Gravida Para Term Preterm AB SAB TAB Ectopic Multiple Living  3 2 2  0 0 0 0 0 0 2    # Outcome Date GA Lbr Len/2nd Weight Sex Delivery Anes PTL Lv  3 CUR           2 TRM 11/22/10 [redacted]w[redacted]d  6 lb 11 oz (3.033 kg) M SVD EPI  Y  1 TRM 12/25/04 [redacted]w[redacted]d  6 lb 12 oz (3.062 kg) F SVD EPI  Y     Past Medical History  Diagnosis Date  . No pertinent past medical history   . Headache(784.0)     Prior hx migraines  . Complication of anesthesia   . PONV (postoperative nausea and vomiting)     n/v after iv anesthetic   Past Surgical History  Procedure Laterality Date  . Multiple tooth extractions     History reviewed. No pertinent family history.   Exam    Uterus:     Pelvic Exam:    Perineum: Normal Perineum   Vulva: normal   Vagina:  normal mucosa, normal discharge   pH:    Cervix: closed and long   Adnexa: no mass, fullness, tenderness   Bony Pelvis: android  System: Breast:  normal appearance, no masses or tenderness   Skin: normal coloration and turgor, no rashes    Neurologic: oriented, no focal deficits   Extremities: normal strength, tone, and muscle mass   HEENT extra ocular movement intact   Mouth/Teeth mucous membranes moist, pharynx normal without lesions   Neck supple and no masses   Cardiovascular: regular rate and rhythm   Respiratory:  chest clear, no wheezing, crepitations, rhonchi, normal symmetric air entry   Abdomen: soft, non-tender; bowel sounds normal; no masses,  no organomegaly   Urinary:       Assessment:    Pregnancy: [id number] Patient Active Problem List   Diagnosis Date  Noted  . Supervision of other normal pregnancy 07/25/2013        Plan:     Initial labs drawn. Prenatal vitamins. Problem list reviewed and updated. Genetic Screening discussed First Screen: requested.  Ultrasound discussed; fetal survey: will be ordered at a later visit. Patient desires BTL  Follow up in 4 weeks. 50% of 30 min visit spent on counseling and coordination of care.     Keishaun Hazel 07/25/2013

## 2013-07-25 NOTE — Patient Instructions (Signed)
Pregnancy - First Trimester During sexual intercourse, millions of sperm go into the vagina. Only 1 sperm will penetrate and fertilize the female egg while it is in the Fallopian tube. One week later, the fertilized egg implants into the wall of the uterus. An embryo begins to develop into a baby. At 6 to 8 weeks, the eyes and face are formed and the heartbeat can be seen on ultrasound. At the end of 12 weeks (first trimester), all the baby's organs are formed. Now that you are pregnant, you will want to do everything you can to have a healthy baby. Two of the most important things are to get good prenatal care and follow your caregiver's instructions. Prenatal care is all the medical care you receive before the baby's birth. It is given to prevent, find, and treat problems during the pregnancy and childbirth. PRENATAL EXAMS  During prenatal visits, your weight, blood pressure, and urine are checked. This is done to make sure you are healthy and progressing normally during the pregnancy.  A pregnant woman should gain 25 to 35 pounds during the pregnancy. However, if you are overweight or underweight, your caregiver will advise you regarding your weight.  Your caregiver will ask and answer questions for you.  Blood work, cervical cultures, other necessary tests, and a Pap test are done during your prenatal exams. These tests are done to check on your health and the probable health of your baby. Tests are strongly recommended and done for HIV with your permission. This is the virus that causes AIDS. These tests are done because medicines can be given to help prevent your baby from being born with this infection should you have been infected without knowing it. Blood work is also used to find out your blood type, previous infections, and follow your blood levels (hemoglobin).  Low hemoglobin (anemia) is common during pregnancy. Iron and vitamins are given to help prevent this. Later in the pregnancy,  blood tests for diabetes will be done along with any other tests if any problems develop.  You may need other tests to make sure you and the baby are doing well. CHANGES DURING THE FIRST TRIMESTER  Your body goes through many changes during pregnancy. They vary from person to person. Talk to your caregiver about changes you notice and are concerned about. Changes can include:  Your menstrual period stops.  The egg and sperm carry the genes that determine what you look like. Genes from you and your partner are forming a baby. The female genes determine whether the baby is a boy or a girl.  Your body increases in girth and you may feel bloated.  Feeling sick to your stomach (nauseous) and throwing up (vomiting). If the vomiting is uncontrollable, call your caregiver.  Your breasts will begin to enlarge and become tender.  Your nipples may stick out more and become darker.  The need to urinate more. Painful urination may mean you have a bladder infection.  Tiring easily.  Loss of appetite.  Cravings for certain kinds of food.  At first, you may gain or lose a couple of pounds.  You may have changes in your emotions from day to day (excited to be pregnant or concerned something may go wrong with the pregnancy and baby).  You may have more vivid and strange dreams. HOME CARE INSTRUCTIONS   It is very important to avoid all smoking, alcohol and non-prescribed drugs during your pregnancy. These affect the formation and growth of the baby.   Avoid chemicals while pregnant to ensure the delivery of a healthy infant.  Start your prenatal visits by the 12th week of pregnancy. They are usually scheduled monthly at first, then more often in the last 2 months before delivery. Keep your caregiver's appointments. Follow your caregiver's instructions regarding medicine use, blood and lab tests, exercise, and diet.  During pregnancy, you are providing food for you and your baby. Eat regular,  well-balanced meals. Choose foods such as meat, fish, milk and other low fat dairy products, vegetables, fruits, and whole-grain breads and cereals. Your caregiver will tell you of the ideal weight gain.  You can help morning sickness by keeping soda crackers at the bedside. Eat a couple before arising in the morning. You may want to use the crackers without salt on them.  Eating 4 to 5 small meals rather than 3 large meals a day also may help the nausea and vomiting.  Drinking liquids between meals instead of during meals also seems to help nausea and vomiting.  A physical sexual relationship may be continued throughout pregnancy if there are no other problems. Problems may be early (premature) leaking of amniotic fluid from the membranes, vaginal bleeding, or belly (abdominal) pain.  Exercise regularly if there are no restrictions. Check with your caregiver or physical therapist if you are unsure of the safety of some of your exercises. Greater weight gain will occur in the last 2 trimesters of pregnancy. Exercising will help:  Control your weight.  Keep you in shape.  Prepare you for labor and delivery.  Help you lose your pregnancy weight after you deliver your baby.  Wear a good support or jogging bra for breast tenderness during pregnancy. This may help if worn during sleep too.  Ask when prenatal classes are available. Begin classes when they are offered.  Do not use hot tubs, steam rooms, or saunas.  Wear your seat belt when driving. This protects you and your baby if you are in an accident.  Avoid raw meat, uncooked cheese, cat litter boxes, and soil used by cats throughout the pregnancy. These carry germs that can cause birth defects in the baby.  The first trimester is a good time to visit your dentist for your dental health. Getting your teeth cleaned is okay. Use a softer toothbrush and brush gently during pregnancy.  Ask for help if you have financial, counseling, or  nutritional needs during pregnancy. Your caregiver will be able to offer counseling for these needs as well as refer you for other special needs.  Do not take any medicines or herbs unless told by your caregiver.  Inform your caregiver if there is any mental or physical domestic violence.  Make a list of emergency phone numbers of family, friends, hospital, and police and fire departments.  Write down your questions. Take them to your prenatal visit.  Do not douche.  Do not cross your legs.  If you have to stand for long periods of time, rotate you feet or take small steps in a circle.  You may have more vaginal secretions that may require a sanitary pad. Do not use tampons or scented sanitary pads. MEDICINES AND DRUG USE IN PREGNANCY  Take prenatal vitamins as directed. The vitamin should contain 1 milligram of folic acid. Keep all vitamins out of reach of children. Only a couple vitamins or tablets containing iron may be fatal to a baby or young child when ingested.  Avoid use of all medicines, including herbs, over-the-counter medicines, not   prescribed or suggested by your caregiver. Only take over-the-counter or prescription medicines for pain, discomfort, or fever as directed by your caregiver. Do not use aspirin, ibuprofen, or naproxen unless directed by your caregiver.  Let your caregiver also know about herbs you may be using.  Alcohol is related to a number of birth defects. This includes fetal alcohol syndrome. All alcohol, in any form, should be avoided completely. Smoking will cause low birth rate and premature babies.  Street or illegal drugs are very harmful to the baby. They are absolutely forbidden. A baby born to an addicted mother will be addicted at birth. The baby will go through the same withdrawal an adult does.  Let your caregiver know about any medicines that you have to take and for what reason you take them. SEEK MEDICAL CARE IF:  You have any concerns or  worries during your pregnancy. It is better to call with your questions if you feel they cannot wait, rather than worry about them. SEEK IMMEDIATE MEDICAL CARE IF:   An unexplained oral temperature above 102 F (38.9 C) develops, or as your caregiver suggests.  You have leaking of fluid from the vagina (birth canal). If leaking membranes are suspected, take your temperature and inform your caregiver of this when you call.  There is vaginal spotting or bleeding. Notify your caregiver of the amount and how many pads are used.  You develop a bad smelling vaginal discharge with a change in the color.  You continue to feel sick to your stomach (nauseated) and have no relief from remedies suggested. You vomit blood or coffee ground-like materials.  You lose more than 2 pounds of weight in 1 week.  You gain more than 2 pounds of weight in 1 week and you notice swelling of your face, hands, feet, or legs.  You gain 5 pounds or more in 1 week (even if you do not have swelling of your hands, face, legs, or feet).  You get exposed to [place] measles and have never had them.  You are exposed to fifth disease or chickenpox.  You develop belly (abdominal) pain. Round ligament discomfort is a common non-cancerous (benign) cause of abdominal pain in pregnancy. Your caregiver still must evaluate this.  You develop headache, fever, diarrhea, pain with urination, or shortness of breath.  You fall or are in a car accident or have any kind of trauma.  There is mental or physical violence in your home. Document Released: 08/18/2001 Document Revised: 05/18/2012 Document Reviewed: 02/19/2009 [facility] Patient Information 2014 [place].  Contraception Choices Contraception (birth control) is the use of any methods or devices to prevent pregnancy. Below are some methods to help avoid pregnancy. HORMONAL METHODS   Contraceptive implant This is a thin, plastic tube containing progesterone hormone. It  does not contain estrogen hormone. Your health care provider inserts the tube in the inner part of the upper arm. The tube can remain in place for up to 3 years. After 3 years, the implant must be removed. The implant prevents the ovaries from releasing an egg (ovulation), thickens the cervical mucus to prevent sperm from entering the uterus, and thins the lining of the inside of the uterus.  Progesterone-only injections These injections are given every 3 months by your health care provider to prevent pregnancy. This synthetic progesterone hormone stops the ovaries from releasing eggs. It also thickens cervical mucus and changes the uterine lining. This makes it harder for sperm to survive in the uterus.  Birth  control pills These pills contain estrogen and progesterone hormone. They work by preventing the ovaries from releasing eggs (ovulation). They also cause the cervical mucus to thicken, preventing the sperm from entering the uterus. Birth control pills are prescribed by a health care provider.Birth control pills can also be used to treat heavy periods.  Minipill This type of birth control pill contains only the progesterone hormone. They are taken every day of each month and must be prescribed by your health care provider.  Birth control patch The patch contains hormones similar to those in birth control pills. It must be changed once a week and is prescribed by a health care provider.  Vaginal ring The ring contains hormones similar to those in birth control pills. It is left in the vagina for 3 weeks, removed for 1 week, and then a new one is put back in place. The patient must be comfortable inserting and removing the ring from the vagina.A health care provider's prescription is necessary.  Emergency contraception Emergency contraceptives prevent pregnancy after unprotected sexual intercourse. This pill can be taken right after sex or up to 5 days after unprotected sex. It is most effective  the sooner you take the pills after having sexual intercourse. Most emergency contraceptive pills are available without a prescription. Check with your pharmacist. Do not use emergency contraception as your only form of birth control. BARRIER METHODS   Female condom This is a thin sheath (latex or rubber) that is worn over the penis during sexual intercourse. It can be used with spermicide to increase effectiveness.  Female condom. This is a soft, loose-fitting sheath that is put into the vagina before sexual intercourse.  Diaphragm This is a soft, latex, dome-shaped barrier that must be fitted by a health care provider. It is inserted into the vagina, along with a spermicidal jelly. It is inserted before intercourse. The diaphragm should be left in the vagina for 6 to 8 hours after intercourse.  Cervical cap This is a round, soft, latex or plastic cup that fits over the cervix and must be fitted by a health care provider. The cap can be left in place for up to 48 hours after intercourse.  Sponge This is a soft, circular piece of polyurethane foam. The sponge has spermicide in it. It is inserted into the vagina after wetting it and before sexual intercourse.  Spermicides These are chemicals that kill or block sperm from entering the cervix and uterus. They come in the form of creams, jellies, suppositories, foam, or tablets. They do not require a prescription. They are inserted into the vagina with an applicator before having sexual intercourse. The process must be repeated every time you have sexual intercourse. INTRAUTERINE CONTRACEPTION  Intrauterine device (IUD) This is a T-shaped device that is put in a woman's uterus during a menstrual period to prevent pregnancy. There are 2 types:  Copper IUD This type of IUD is wrapped in copper wire and is placed inside the uterus. Copper makes the uterus and fallopian tubes produce a fluid that kills sperm. It can stay in place for 10 years.  Hormone IUD  This type of IUD contains the hormone progestin (synthetic progesterone). The hormone thickens the cervical mucus and prevents sperm from entering the uterus, and it also thins the uterine lining to prevent implantation of a fertilized egg. The hormone can weaken or kill the sperm that get into the uterus. It can stay in place for 3 5 years, depending on which type of  IUD is used. PERMANENT METHODS OF CONTRACEPTION  Female tubal ligation This is when the woman's fallopian tubes are surgically sealed, tied, or blocked to prevent the egg from traveling to the uterus.  Hysteroscopic sterilization This involves placing a small coil or insert into each fallopian tube. Your doctor uses a technique called hysteroscopy to do the procedure. The device causes scar tissue to form. This results in permanent blockage of the fallopian tubes, so the sperm cannot fertilize the egg. It takes about 3 months after the procedure for the tubes to become blocked. You must use another form of birth control for these 3 months.  Female sterilization This is when the female has the tubes that carry sperm tied off (vasectomy).This blocks sperm from entering the vagina during sexual intercourse. After the procedure, the man can still ejaculate fluid (semen). NATURAL PLANNING METHODS  Natural family planning This is not having sexual intercourse or using a barrier method (condom, diaphragm, cervical cap) on days the woman could become pregnant.  Calendar method This is keeping track of the length of each menstrual cycle and identifying when you are fertile.  Ovulation method This is avoiding sexual intercourse during ovulation.  Symptothermal method This is avoiding sexual intercourse during ovulation, using a thermometer and ovulation symptoms.  Post ovulation method This is timing sexual intercourse after you have ovulated. Regardless of which type or method of contraception you choose, it is important that you use condoms to  protect against the transmission of sexually transmitted infections (STIs). Talk with your health care provider about which form of contraception is most appropriate for you. Document Released: 08/24/2005 Document Revised: 04/26/2013 Document Reviewed: 02/16/2013 [facility] Patient Information 2014 [place].  Breastfeeding Deciding to breastfeed is one of the best choices you can make for you and your baby. A change in hormones during pregnancy causes your breast tissue to grow and increases the number and size of your milk ducts. These hormones also allow proteins, sugars, and fats from your blood supply to make breast milk in your milk-producing glands. Hormones prevent breast milk from being released before your baby is born as well as prompt milk flow after birth. Once breastfeeding has begun, thoughts of your baby, as well as his or her sucking or crying, can stimulate the release of milk from your milk-producing glands.  BENEFITS OF BREASTFEEDING For Your Baby  Your first milk (colostrum) helps your baby's digestive system function better.   There are antibodies in your milk that help your baby fight off infections.   Your baby has a lower incidence of asthma, allergies, and sudden infant death syndrome.   The nutrients in breast milk are better for your baby than infant formulas and are designed uniquely for your baby's needs.   Breast milk improves your baby's brain development.   Your baby is less likely to develop other conditions, such as childhood obesity, asthma, or type 2 diabetes mellitus.  For You   Breastfeeding helps to create a very special bond between you and your baby.   Breastfeeding is convenient. Breast milk is always available at the correct temperature and costs nothing.   Breastfeeding helps to burn calories and helps you lose the weight gained during pregnancy.   Breastfeeding makes your uterus contract to its prepregnancy size faster and slows  bleeding (lochia) after you give birth.   Breastfeeding helps to lower your risk of developing type 2 diabetes mellitus, osteoporosis, and breast or ovarian cancer later in life. SIGNS THAT YOUR  BABY IS HUNGRY Early Signs of Hunger  Increased alertness or activity.  Stretching.  Movement of the head from side to side.  Movement of the head and opening of the mouth when the corner of the mouth or cheek is stroked (rooting).  Increased sucking sounds, smacking lips, cooing, sighing, or squeaking.  Hand-to-mouth movements.  Increased sucking of fingers or hands. Late Signs of Hunger  Fussing.  Intermittent crying. Extreme Signs of Hunger Signs of extreme hunger will require calming and consoling before your baby will be able to breastfeed successfully. Do not wait for the following signs of extreme hunger to occur before you initiate breastfeeding:   Restlessness.  A loud, strong cry.   Screaming. BREASTFEEDING BASICS Breastfeeding Initiation  Find a comfortable place to sit or lie down, with your neck and back well supported.  Place a pillow or rolled up blanket under your baby to bring him or her to the level of your breast (if you are seated). Nursing pillows are specially designed to help support your arms and your baby while you breastfeed.  Make sure that your baby's abdomen is facing your abdomen.   Gently massage your breast. With your fingertips, massage from your chest wall toward your nipple in a circular motion. This encourages milk flow. You may need to continue this action during the feeding if your milk flows slowly.  Support your breast with 4 fingers underneath and your thumb above your nipple. Make sure your fingers are well away from your nipple and your baby's mouth.   Stroke your baby's lips gently with your finger or nipple.   When your baby's mouth is open wide enough, quickly bring your baby to your breast, placing your entire nipple and as  much of the colored area around your nipple (areola) as possible into your baby's mouth.   More areola should be visible above your baby's upper lip than below the lower lip.   Your baby's tongue should be between his or her lower gum and your breast.   Ensure that your baby's mouth is correctly positioned around your nipple (latched). Your baby's lips should create a seal on your breast and be turned out (everted).  It is common for your baby to suck about 2 3 minutes in order to start the flow of breast milk. Latching Teaching your baby how to latch on to your breast properly is very important. An improper latch can cause nipple pain and decreased milk supply for you and poor weight gain in your baby. Also, if your baby is not latched onto your nipple properly, he or she may swallow some air during feeding. This can make your baby fussy. Burping your baby when you switch breasts during the feeding can help to get rid of the air. However, teaching your baby to latch on properly is still the best way to prevent fussiness from swallowing air while breastfeeding. Signs that your baby has successfully latched on to your nipple:    Silent tugging or silent sucking, without causing you pain.   Swallowing heard between every 3 4 sucks.    Muscle movement above and in front of his or her ears while sucking.  Signs that your baby has not successfully latched on to nipple:   Sucking sounds or smacking sounds from your baby while breastfeeding.  Nipple pain. If you think your baby has not latched on correctly, slip your finger into the corner of your baby's mouth to break the suction and place  it between your baby's gums. Attempt breastfeeding initiation again. Signs of Successful Breastfeeding Signs from your baby:   A gradual decrease in the number of sucks or complete cessation of sucking.   Falling asleep.   Relaxation of his or her body.   Retention of a small amount of milk in  his or her mouth.   Letting go of your breast by himself or herself. Signs from you:  Breasts that have increased in firmness, weight, and size 1 3 hours after feeding.   Breasts that are softer immediately after breastfeeding.  Increased milk volume, as well as a change in milk consistency and color by the 5th day of breastfeeding.   Nipples that are not sore, cracked, or bleeding. Signs That Your Baby is Getting Enough Milk  Wetting at least 3 diapers in a 24-hour period. The urine should be clear and pale yellow by age 5 days.  At least 3 stools in a 24-hour period by age 5 days. The stool should be soft and yellow.  At least 3 stools in a 24-hour period by age 7 days. The stool should be seedy and yellow.  No loss of weight greater than 10% of birth weight during the first 3 days of age.  Average weight gain of 4 7 ounces (120 210 mL) per week after age 4 days.  Consistent daily weight gain by age 5 days, without weight loss after the age of 2 weeks. After a feeding, your baby may spit up a small amount. This is common. BREASTFEEDING FREQUENCY AND DURATION Frequent feeding will help you make more milk and can prevent sore nipples and breast engorgement. Breastfeed when you feel the need to reduce the fullness of your breasts or when your baby shows signs of hunger. This is called "breastfeeding on demand." Avoid introducing a pacifier to your baby while you are working to establish breastfeeding (the first 4 6 weeks after your baby is born). After this time you may choose to use a pacifier. Research has shown that pacifier use during the first year of a baby's life decreases the risk of sudden infant death syndrome (SIDS). Allow your baby to feed on each breast as long as he or she wants. Breastfeed until your baby is finished feeding. When your baby unlatches or falls asleep while feeding from the first breast, offer the second breast. Because newborns are often sleepy in the  first few weeks of life, you may need to awaken your baby to get him or her to feed. Breastfeeding times will vary from baby to baby. However, the following rules can serve as a guide to help you ensure that your baby is properly fed:  Newborns (babies 4 weeks of age or younger) may breastfeed every 1 3 hours.  Newborns should not go longer than 3 hours during the day or 5 hours during the night without breastfeeding.  You should breastfeed your baby a minimum of 8 times in a 24-hour period until you begin to introduce solid foods to your baby at around 6 months of age. BREAST MILK PUMPING Pumping and storing breast milk allows you to ensure that your baby is exclusively fed your breast milk, even at times when you are unable to breastfeed. This is especially important if you are going back to work while you are still breastfeeding or when you are not able to be present during feedings. Your lactation consultant can give you guidelines on how long it is safe to store breast   milk.  A breast pump is a machine that allows you to pump milk from your breast into a sterile bottle. The pumped breast milk can then be stored in a refrigerator or freezer. Some breast pumps are operated by hand, while others use electricity. Ask your lactation consultant which type will work best for you. Breast pumps can be purchased, but some hospitals and breastfeeding support groups lease breast pumps on a monthly basis. A lactation consultant can teach you how to hand express breast milk, if you prefer not to use a pump.  CARING FOR YOUR BREASTS WHILE YOU BREASTFEED Nipples can become dry, cracked, and sore while breastfeeding. The following recommendations can help keep your breasts moisturized and healthy:  Avoid using soap on your nipples.   Wear a supportive bra. Although not required, special nursing bras and tank tops are designed to allow access to your breasts for breastfeeding without taking off your entire bra  or top. Avoid wearing underwire style bras or extremely tight bras.  Air dry your nipples for 3 84minutes after each feeding.   Use only cotton bra pads to absorb leaked breast milk. Leaking of breast milk between feedings is normal.   Use lanolin on your nipples after breastfeeding. Lanolin helps to maintain your skin's normal moisture barrier. If you use pure lanolin you do not need to wash it off before feeding your baby again. Pure lanolin is not toxic to your baby. You may also hand express a few drops of breast milk and gently massage that milk into your nipples and allow the milk to air dry. In the first few weeks after giving birth, some women experience extremely full breasts (engorgement). Engorgement can make your breasts feel heavy, warm, and tender to the touch. Engorgement peaks within 3 5 days after you give birth. The following recommendations can help ease engorgement:  Completely empty your breasts while breastfeeding or pumping. You may want to start by applying warm, moist heat (in the shower or with warm water-soaked hand towels) just before feeding or pumping. This increases circulation and helps the milk flow. If your baby does not completely empty your breasts while breastfeeding, pump any extra milk after he or she is finished.  Wear a snug bra (nursing or regular) or tank top for 1 2 days to signal your body to slightly decrease milk production.  Apply ice packs to your breasts, unless this is too uncomfortable for you.  Make sure that your baby is latched on and positioned properly while breastfeeding. If engorgement persists after 48 hours of following these recommendations, contact your health care provider or a Science writer. OVERALL HEALTH CARE RECOMMENDATIONS WHILE BREASTFEEDING  Eat healthy foods. Alternate between meals and snacks, eating 3 of each per day. Because what you eat affects your breast milk, some of the foods may make your baby more irritable  than usual. Avoid eating these foods if you are sure that they are negatively affecting your baby.  Drink milk, fruit juice, and water to satisfy your thirst (about 10 glasses a day).   Rest often, relax, and continue to take your prenatal vitamins to prevent fatigue, stress, and anemia.  Continue breast self-awareness checks.  Avoid chewing and smoking tobacco.  Avoid alcohol and drug use. Some medicines that may be harmful to your baby can pass through breast milk. It is important to ask your health care provider before taking any medicine, including all over-the-counter and prescription medicine as well as vitamin and herbal supplements.  It is possible to become pregnant while breastfeeding. If birth control is desired, ask your health care provider about options that will be safe for your baby. SEEK MEDICAL CARE IF:   You feel like you want to stop breastfeeding or have become frustrated with breastfeeding.  You have painful breasts or nipples.  Your nipples are cracked or bleeding.  Your breasts are red, tender, or warm.  You have a swollen area on either breast.  You have a fever or chills.  You have nausea or vomiting.  You have drainage other than breast milk from your nipples.  Your breasts do not become full before feedings by the 5th day after you give birth.  You feel sad and depressed.  Your baby is too sleepy to eat well.  Your baby is having trouble sleeping.   Your baby is wetting less than 3 diapers in a 24-hour period.  Your baby has less than 3 stools in a 24-hour period.  Your baby's skin or the white part of his or her eyes becomes yellow.   Your baby is not gaining weight by 6 days of age. SEEK IMMEDIATE MEDICAL CARE IF:   Your baby is overly tired (lethargic) and does not want to wake up and feed.  Your baby develops an unexplained fever. Document Released: 08/24/2005 Document Revised: 04/26/2013 Document Reviewed: 02/15/2013 [facility]  Patient Information 2014 [place].

## 2013-07-25 NOTE — Progress Notes (Signed)
Pulse- 86  Edema-feet  Pain/pressure-lower abd Flu vaccine given @ [place] Weight gain 25-35lb New ob packet given

## 2013-07-26 LAB — ALCOHOL METABOLITE (ETG), URINE: Ethyl Glucuronide (EtG): NEGATIVE ng/mL

## 2013-07-26 LAB — PRESCRIPTION MONITORING PROFILE (19 PANEL)
Amphetamine/Meth: NEGATIVE ng/mL
Barbiturate Screen, Urine: NEGATIVE ng/mL
Benzodiazepine Screen, Urine: NEGATIVE ng/mL
Buprenorphine, Urine: NEGATIVE ng/mL
Meperidine, Ur: NEGATIVE ng/mL
Methaqualone: NEGATIVE ng/mL
Nitrites, Initial: NEGATIVE ug/mL
Opiate Screen, Urine: NEGATIVE ng/mL
Propoxyphene: NEGATIVE ng/mL
Tapentadol, urine: NEGATIVE ng/mL
Tramadol Scrn, Ur: NEGATIVE ng/mL
Zolpidem, Urine: NEGATIVE ng/mL
pH, Initial: 6.8 pH (ref 4.5–8.9)

## 2013-07-26 LAB — OBSTETRIC PANEL
Antibody Screen: NEGATIVE
Eosinophils Absolute: 0.1 10*3/uL (ref 0.0–0.7)
Eosinophils Relative: 1 % (ref 0–5)
Hemoglobin: 10.2 g/dL — ABNORMAL LOW (ref 12.0–15.0)
Hepatitis B Surface Ag: NEGATIVE
Lymphocytes Relative: 21 % (ref 12–46)
Lymphs Abs: 1.6 10*3/uL (ref 0.7–4.0)
MCV: 79.2 fL (ref 78.0–100.0)
Monocytes Relative: 8 % (ref 3–12)
Platelets: 317 10*3/uL (ref 150–400)
RBC: 3.8 MIL/uL — ABNORMAL LOW (ref 3.87–5.11)
Rh Type: POSITIVE
Rubella: 1.06 Index — ABNORMAL HIGH (ref <0.90)
WBC: 7.6 10*3/uL (ref 4.0–10.5)

## 2013-07-26 LAB — HIV ANTIBODY (ROUTINE TESTING W REFLEX): HIV: NONREACTIVE

## 2013-07-28 LAB — HEMOGLOBINOPATHY EVALUATION
Hgb A2 Quant: 2.9 % (ref 2.2–3.2)
Hgb F Quant: 0 % (ref 0.0–2.0)
Hgb S Quant: 33.2 % — ABNORMAL HIGH

## 2013-07-28 LAB — CULTURE, OB URINE: Colony Count: 40000

## 2013-07-31 ENCOUNTER — Other Ambulatory Visit: Payer: Self-pay | Admitting: Obstetrics and Gynecology

## 2013-07-31 DIAGNOSIS — Z3682 Encounter for antenatal screening for nuchal translucency: Secondary | ICD-10-CM

## 2013-07-31 DIAGNOSIS — IMO0002 Reserved for concepts with insufficient information to code with codable children: Secondary | ICD-10-CM

## 2013-07-31 DIAGNOSIS — Z0489 Encounter for examination and observation for other specified reasons: Secondary | ICD-10-CM

## 2013-08-01 ENCOUNTER — Ambulatory Visit (HOSPITAL_COMMUNITY)
Admission: RE | Admit: 2013-08-01 | Discharge: 2013-08-01 | Disposition: A | Discharge status: Home / Self Care | Payer: 59 | Source: Ambulatory Visit | Attending: Obstetrics and Gynecology | Admitting: Obstetrics and Gynecology

## 2013-08-01 ENCOUNTER — Other Ambulatory Visit: Payer: Self-pay

## 2013-08-01 DIAGNOSIS — Z3689 Encounter for other specified antenatal screening: Secondary | ICD-10-CM | POA: Insufficient documentation

## 2013-08-01 DIAGNOSIS — O09219 Supervision of pregnancy with history of pre-term labor, unspecified trimester: Secondary | ICD-10-CM | POA: Insufficient documentation

## 2013-08-01 DIAGNOSIS — O351XX0 Maternal care for (suspected) chromosomal abnormality in fetus, not applicable or unspecified: Secondary | ICD-10-CM | POA: Insufficient documentation

## 2013-08-01 DIAGNOSIS — O3510X0 Maternal care for (suspected) chromosomal abnormality in fetus, unspecified, not applicable or unspecified: Secondary | ICD-10-CM | POA: Insufficient documentation

## 2013-08-01 DIAGNOSIS — Z3682 Encounter for antenatal screening for nuchal translucency: Secondary | ICD-10-CM

## 2013-08-02 ENCOUNTER — Encounter: Payer: Self-pay | Admitting: Obstetrics and Gynecology

## 2013-08-13 ENCOUNTER — Encounter (HOSPITAL_COMMUNITY): Payer: Self-pay | Admitting: *Deleted

## 2013-08-13 ENCOUNTER — Inpatient Hospital Stay (HOSPITAL_COMMUNITY)
Admission: AD | Admit: 2013-08-13 | Discharge: 2013-08-13 | Disposition: A | Discharge status: Home / Self Care | Payer: 59 | Source: Ambulatory Visit | Attending: Obstetrics & Gynecology | Admitting: Obstetrics & Gynecology

## 2013-08-13 DIAGNOSIS — M25569 Pain in unspecified knee: Secondary | ICD-10-CM | POA: Insufficient documentation

## 2013-08-13 DIAGNOSIS — O99891 Other specified diseases and conditions complicating pregnancy: Secondary | ICD-10-CM | POA: Insufficient documentation

## 2013-08-13 DIAGNOSIS — M545 Low back pain, unspecified: Secondary | ICD-10-CM | POA: Insufficient documentation

## 2013-08-13 DIAGNOSIS — N949 Unspecified condition associated with female genital organs and menstrual cycle: Secondary | ICD-10-CM

## 2013-08-13 DIAGNOSIS — R109 Unspecified abdominal pain: Secondary | ICD-10-CM | POA: Insufficient documentation

## 2013-08-13 LAB — URINALYSIS, ROUTINE W REFLEX MICROSCOPIC
Nitrite: NEGATIVE
Protein, ur: NEGATIVE mg/dL
Urobilinogen, UA: 0.2 mg/dL (ref 0.0–1.0)

## 2013-08-13 LAB — URINE MICROSCOPIC-ADD ON

## 2013-08-13 MED ORDER — CYCLOBENZAPRINE HCL 10 MG PO TABS: 10.0000 mg | ORAL_TABLET | Freq: Two times a day (BID) | ORAL | Status: DC | PRN

## 2013-08-13 NOTE — MAU Note (Signed)
Measured thigh two inches above knees right: 18.5"  Left 18.75" Measured three inches below knee     Right lower  Leg: 14.5"     Left lower leg: 13.75"

## 2013-08-13 NOTE — MAU Provider Note (Signed)
Attestation of Attending Supervision of Advanced Practitioner (CNM/NP): Evaluation and management procedures were performed by the Advanced Practitioner under my supervision and collaboration.  I have reviewed the Advanced Practitioner's note and chart, and I agree with the management and plan.  HARRAWAY-Kosiba, Marselino Slayton 10:45 PM     

## 2013-08-13 NOTE — MAU Note (Signed)
Right thigh 14.5   Left thigh 14.0" Right lower leg: 18.5"      Left lower leg 18.0"
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
## 2013-08-15 LAB — URINE CULTURE

## 2013-08-24 ENCOUNTER — Encounter: Payer: 59 | Admitting: Obstetrics and Gynecology

## 2013-08-25 ENCOUNTER — Encounter: Payer: 59 | Admitting: Family Medicine

## 2013-09-07 NOTE — L&D Delivery Note (Signed)
Delivery Note At 9:37 PM a viable and healthy female was delivered via Vaginal, Spontaneous Delivery (Presentation: ; Occiput Anterior).  APGAR: 10, 10; weight 6 lb 6 oz (2892 g).   Placenta status: Intact, Spontaneous.  Cord: 3 vessels with the following complications: None.  Cord pH: NA  Anesthesia: Epidural  Episiotomy: None Lacerations: Vaginal;1st degree Suture Repair: 3.0 vicryl rapide Est. Blood Loss (mL): 250  Mom to postpartum.  Baby to Couplet care / Skin to Skin. Placenta to: BS Feeding: Br/Bo Circ: OP Contraception: [place] 02/06/2014, 11:13 PM

## 2013-09-11 ENCOUNTER — Encounter: Payer: Self-pay | Admitting: Obstetrics and Gynecology

## 2013-09-11 ENCOUNTER — Ambulatory Visit (INDEPENDENT_AMBULATORY_CARE_PROVIDER_SITE_OTHER): Payer: 59 | Admitting: Obstetrics and Gynecology

## 2013-09-11 VITALS — BP 108/70 | Temp 97.0°F | Wt 166.2 lb

## 2013-09-11 DIAGNOSIS — Z3482 Encounter for supervision of other normal pregnancy, second trimester: Secondary | ICD-10-CM

## 2013-09-11 DIAGNOSIS — Z3480 Encounter for supervision of other normal pregnancy, unspecified trimester: Secondary | ICD-10-CM
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
## 2013-09-13 ENCOUNTER — Other Ambulatory Visit: Payer: 59

## 2013-09-14 ENCOUNTER — Ambulatory Visit (HOSPITAL_COMMUNITY)
Admission: RE | Admit: 2013-09-14 | Discharge: 2013-09-14 | Disposition: A | Discharge status: Home / Self Care | Payer: 59 | Source: Ambulatory Visit | Attending: Obstetrics and Gynecology | Admitting: Obstetrics and Gynecology

## 2013-09-14 DIAGNOSIS — Z3689 Encounter for other specified antenatal screening: Secondary | ICD-10-CM | POA: Diagnosis present

## 2013-09-14 DIAGNOSIS — Z3482 Encounter for supervision of other normal pregnancy, second trimester: Secondary | ICD-10-CM

## 2013-09-14 LAB — ALPHA FETOPROTEIN, MATERNAL
AFP: 60.4 [IU]/mL
CURR GEST AGE: 19.1 wks.days
MoM for AFP: 1.29
OPEN SPINA BIFIDA: NEGATIVE
Osb Risk: 1:10500 {titer}

## 2013-09-15 ENCOUNTER — Encounter: Payer: Self-pay | Admitting: Obstetrics and Gynecology

## 2013-09-27 ENCOUNTER — Encounter: Payer: Self-pay | Admitting: *Deleted

## 2013-10-10 ENCOUNTER — Ambulatory Visit (INDEPENDENT_AMBULATORY_CARE_PROVIDER_SITE_OTHER): Payer: 59 | Admitting: Family Medicine

## 2013-10-10 VITALS — BP 117/70 | Temp 97.7°F | Wt 173.0 lb

## 2013-10-10 DIAGNOSIS — Z3480 Encounter for supervision of other normal pregnancy, unspecified trimester: Secondary | ICD-10-CM

## 2013-10-10 LAB — POCT URINALYSIS DIP (DEVICE)
Bilirubin Urine: NEGATIVE
GLUCOSE, UA: NEGATIVE mg/dL
Ketones, ur: NEGATIVE mg/dL
NITRITE: NEGATIVE
PROTEIN: NEGATIVE mg/dL
SPECIFIC GRAVITY, URINE: 1.015 (ref 1.005–1.030)
UROBILINOGEN UA: 0.2 mg/dL (ref 0.0–1.0)
pH: 7 (ref 5.0–8.0)

## 2013-10-10 MED ORDER — HYDROXYZINE HCL 10 MG PO TABS: 10.0000 mg | ORAL_TABLET | Freq: Three times a day (TID) | ORAL | Status: DC | PRN

## 2013-10-10 NOTE — Progress Notes (Signed)
P= 84, C/o itching a lot.,denies rash. C/o braxton hicks once in a while, not daily.

## 2013-10-10 NOTE — Progress Notes (Signed)
S: 33 yo G3P2002 @ [redacted]w[redacted]d here for ROBV - some random braxton-hicks. Not painful -itching all over. Tried lotion, not helping. No rashes.  - no lof, vb.  +FM   O: see flowsheet  A/P - doing well overall  -  No concerns  - prn hydroxyzine for itching  - f/u in 3 weeks for 26 week visit

## 2013-10-10 NOTE — Patient Instructions (Signed)
Second Trimester of Pregnancy The second trimester is from week 13 through week 28, months 4 through 6. The second trimester is often a time when you feel your best. Your body has also adjusted to being pregnant, and you begin to feel better physically. Usually, morning sickness has lessened or quit completely, you may have more energy, and you may have an increase in appetite. The second trimester is also a time when the fetus is growing rapidly. At the end of the sixth month, the fetus is about 9 inches long and weighs about 1 pounds. You will likely begin to feel the baby move (quickening) between 18 and 20 weeks of the pregnancy. BODY CHANGES Your body goes through many changes during pregnancy. The changes vary from woman to woman.   Your weight will continue to increase. You will notice your lower abdomen bulging out.  You may begin to get stretch marks on your hips, abdomen, and breasts.  You may develop headaches that can be relieved by medicines approved by your caregiver.  You may urinate more often because the fetus is pressing on your bladder.  You may develop or continue to have heartburn as a result of your pregnancy.  You may develop constipation because certain hormones are causing the muscles that push waste through your intestines to slow down.  You may develop hemorrhoids or swollen, bulging veins (varicose veins).  You may have back pain because of the weight gain and pregnancy hormones relaxing your joints between the bones in your pelvis and as a result of a shift in weight and the muscles that support your balance.  Your breasts will continue to grow and be tender.  Your gums may bleed and may be sensitive to brushing and flossing.  Dark spots or blotches (chloasma, mask of pregnancy) may develop on your face. This will likely fade after the baby is born.  A dark line from your belly button to the pubic area (linea nigra) may appear. This will likely fade after the  baby is born. WHAT TO EXPECT AT YOUR PRENATAL VISITS During a routine prenatal visit:  You will be weighed to make sure you and the fetus are growing normally.  Your blood pressure will be taken.  Your abdomen will be measured to track your baby's growth.  The fetal heartbeat will be listened to.  Any test results from the previous visit will be discussed. Your caregiver may ask you:  How you are feeling.  If you are feeling the baby move.  If you have had any abnormal symptoms, such as leaking fluid, bleeding, severe headaches, or abdominal cramping.  If you have any questions. Other tests that may be performed during your second trimester include:  Blood tests that check for:  Low iron levels (anemia).  Gestational diabetes (between 24 and 28 weeks).  Rh antibodies.  Urine tests to check for infections, diabetes, or protein in the urine.  An ultrasound to confirm the proper growth and development of the baby.  An amniocentesis to check for possible genetic problems.  Fetal screens for spina bifida and Down syndrome. HOME CARE INSTRUCTIONS   Avoid all smoking, herbs, alcohol, and unprescribed drugs. These chemicals affect the formation and growth of the baby.  Follow your caregiver's instructions regarding medicine use. There are medicines that are either safe or unsafe to take during pregnancy.  Exercise only as directed by your caregiver. Experiencing uterine cramps is a good sign to stop exercising.  Continue to eat regular,   healthy meals.  Wear a good support bra for breast tenderness.  Do not use hot tubs, steam rooms, or saunas.  Wear your seat belt at all times when driving.  Avoid raw meat, uncooked cheese, cat litter boxes, and soil used by cats. These carry germs that can cause birth defects in the baby.  Take your prenatal vitamins.  Try taking a stool softener (if your caregiver approves) if you develop constipation. Eat more high-fiber foods,  such as fresh vegetables or fruit and whole grains. Drink plenty of fluids to keep your urine clear or pale yellow.  Take warm sitz baths to soothe any pain or discomfort caused by hemorrhoids. Use hemorrhoid cream if your caregiver approves.  If you develop varicose veins, wear support hose. Elevate your feet for 15 minutes, 3 4 times a day. Limit salt in your diet.  Avoid heavy lifting, wear low heel shoes, and practice good posture.  Rest with your legs elevated if you have leg cramps or low back pain.  Visit your dentist if you have not gone yet during your pregnancy. Use a soft toothbrush to brush your teeth and be gentle when you floss.  A sexual relationship may be continued unless your caregiver directs you otherwise.  Continue to go to all your prenatal visits as directed by your caregiver. SEEK MEDICAL CARE IF:   You have dizziness.  You have mild pelvic cramps, pelvic pressure, or nagging pain in the abdominal area.  You have persistent nausea, vomiting, or diarrhea.  You have a bad smelling vaginal discharge.  You have pain with urination. SEEK IMMEDIATE MEDICAL CARE IF:   You have a fever.  You are leaking fluid from your vagina.  You have spotting or bleeding from your vagina.  You have severe abdominal cramping or pain.  You have rapid weight gain or loss.  You have shortness of breath with chest pain.  You notice sudden or extreme swelling of your face, hands, ankles, feet, or legs.  You have not felt your baby move in over an hour.  You have severe headaches that do not go away with medicine.  You have vision changes. Document Released: 08/18/2001 Document Revised: 04/26/2013 Document Reviewed: 10/25/2012 [employer] Patient Information 2014 [employer].  

## 2013-10-23 ENCOUNTER — Ambulatory Visit (HOSPITAL_COMMUNITY)
Admission: RE | Admit: 2013-10-23 | Discharge: 2013-10-23 | Disposition: A | Discharge status: Home / Self Care | Payer: 59 | Source: Ambulatory Visit | Attending: Obstetrics and Gynecology | Admitting: Obstetrics and Gynecology

## 2013-10-23 DIAGNOSIS — Z3480 Encounter for supervision of other normal pregnancy, unspecified trimester: Secondary | ICD-10-CM

## 2013-10-23 DIAGNOSIS — Z3689 Encounter for other specified antenatal screening: Secondary | ICD-10-CM | POA: Insufficient documentation

## 2013-10-31 ENCOUNTER — Ambulatory Visit (INDEPENDENT_AMBULATORY_CARE_PROVIDER_SITE_OTHER): Payer: 59 | Admitting: Family Medicine

## 2013-10-31 ENCOUNTER — Encounter: Payer: Self-pay | Admitting: Family Medicine

## 2013-10-31 VITALS — BP 117/62 | Temp 97.5°F | Wt 171.1 lb

## 2013-10-31 DIAGNOSIS — L299 Pruritus, unspecified: Secondary | ICD-10-CM

## 2013-10-31 DIAGNOSIS — Z3480 Encounter for supervision of other normal pregnancy, unspecified trimester: Secondary | ICD-10-CM

## 2013-10-31 DIAGNOSIS — Z8742 Personal history of other diseases of the female genital tract: Secondary | ICD-10-CM

## 2013-10-31 LAB — CBC
HEMATOCRIT: 27.8 % — AB (ref 36.0–46.0)
Hemoglobin: 9.2 g/dL — ABNORMAL LOW (ref 12.0–15.0)
MCH: 26 pg (ref 26.0–34.0)
MCHC: 33.1 g/dL (ref 30.0–36.0)
MCV: 78.5 fL (ref 78.0–100.0)
PLATELETS: 315 10*3/uL (ref 150–400)
RBC: 3.54 MIL/uL — ABNORMAL LOW (ref 3.87–5.11)
RDW: 14.2 % (ref 11.5–15.5)
WBC: 14.9 10*3/uL — ABNORMAL HIGH (ref 4.0–10.5)

## 2013-10-31 LAB — POCT URINALYSIS DIP (DEVICE)
Bilirubin Urine: NEGATIVE
Glucose, UA: NEGATIVE mg/dL
KETONES UR: NEGATIVE mg/dL
Nitrite: NEGATIVE
PH: 6.5 (ref 5.0–8.0)
PROTEIN: NEGATIVE mg/dL
Specific Gravity, Urine: 1.015 (ref 1.005–1.030)
UROBILINOGEN UA: 0.2 mg/dL (ref 0.0–1.0)

## 2013-10-31 MED ORDER — FLUCONAZOLE 150 MG PO TABS
ORAL_TABLET | ORAL | Status: DC
Start: 2013-10-31 — End: 2013-12-12

## 2013-10-31 MED ORDER — CETIRIZINE HCL 10 MG PO TABS: 10.0000 mg | ORAL_TABLET | Freq: Every day | ORAL | Status: DC

## 2013-10-31 NOTE — Patient Instructions (Signed)
Second Trimester of Pregnancy The second trimester is from week 13 through week 28, months 4 through 6. The second trimester is often a time when you feel your best. Your body has also adjusted to being pregnant, and you begin to feel better physically. Usually, morning sickness has lessened or quit completely, you may have more energy, and you may have an increase in appetite. The second trimester is also a time when the fetus is growing rapidly. At the end of the sixth month, the fetus is about 9 inches long and weighs about 1 pounds. You will likely begin to feel the baby move (quickening) between 18 and 20 weeks of the pregnancy. BODY CHANGES Your body goes through many changes during pregnancy. The changes vary from woman to woman.   Your weight will continue to increase. You will notice your lower abdomen bulging out.  You may begin to get stretch marks on your hips, abdomen, and breasts.  You may develop headaches that can be relieved by medicines approved by your caregiver.  You may urinate more often because the fetus is pressing on your bladder.  You may develop or continue to have heartburn as a result of your pregnancy.  You may develop constipation because certain hormones are causing the muscles that push waste through your intestines to slow down.  You may develop hemorrhoids or swollen, bulging veins (varicose veins).  You may have back pain because of the weight gain and pregnancy hormones relaxing your joints between the bones in your pelvis and as a result of a shift in weight and the muscles that support your balance.  Your breasts will continue to grow and be tender.  Your gums may bleed and may be sensitive to brushing and flossing.  Dark spots or blotches (chloasma, mask of pregnancy) may develop on your face. This will likely fade after the baby is born.  A dark line from your belly button to the pubic area (linea nigra) may appear. This will likely fade after the  baby is born. WHAT TO EXPECT AT YOUR PRENATAL VISITS During a routine prenatal visit:  You will be weighed to make sure you and the fetus are growing normally.  Your blood pressure will be taken.  Your abdomen will be measured to track your baby's growth.  The fetal heartbeat will be listened to.  Any test results from the previous visit will be discussed. Your caregiver may ask you:  How you are feeling.  If you are feeling the baby move.  If you have had any abnormal symptoms, such as leaking fluid, bleeding, severe headaches, or abdominal cramping.  If you have any questions. Other tests that may be performed during your second trimester include:  Blood tests that check for:  Low iron levels (anemia).  Gestational diabetes (between 24 and 28 weeks).  Rh antibodies.  Urine tests to check for infections, diabetes, or protein in the urine.  An ultrasound to confirm the proper growth and development of the baby.  An amniocentesis to check for possible genetic problems.  Fetal screens for spina bifida and Down syndrome. HOME CARE INSTRUCTIONS   Avoid all smoking, herbs, alcohol, and unprescribed drugs. These chemicals affect the formation and growth of the baby.  Follow your caregiver's instructions regarding medicine use. There are medicines that are either safe or unsafe to take during pregnancy.  Exercise only as directed by your caregiver. Experiencing uterine cramps is a good sign to stop exercising.  Continue to eat regular,   healthy meals.  Wear a good support bra for breast tenderness.  Do not use hot tubs, steam rooms, or saunas.  Wear your seat belt at all times when driving.  Avoid raw meat, uncooked cheese, cat litter boxes, and soil used by cats. These carry germs that can cause birth defects in the baby.  Take your prenatal vitamins.  Try taking a stool softener (if your caregiver approves) if you develop constipation. Eat more high-fiber foods,  such as fresh vegetables or fruit and whole grains. Drink plenty of fluids to keep your urine clear or pale yellow.  Take warm sitz baths to soothe any pain or discomfort caused by hemorrhoids. Use hemorrhoid cream if your caregiver approves.  If you develop varicose veins, wear support hose. Elevate your feet for 15 minutes, 3 4 times a day. Limit salt in your diet.  Avoid heavy lifting, wear low heel shoes, and practice good posture.  Rest with your legs elevated if you have leg cramps or low back pain.  Visit your dentist if you have not gone yet during your pregnancy. Use a soft toothbrush to brush your teeth and be gentle when you floss.  A sexual relationship may be continued unless your caregiver directs you otherwise.  Continue to go to all your prenatal visits as directed by your caregiver. SEEK MEDICAL CARE IF:   You have dizziness.  You have mild pelvic cramps, pelvic pressure, or nagging pain in the abdominal area.  You have persistent nausea, vomiting, or diarrhea.  You have a bad smelling vaginal discharge.  You have pain with urination. SEEK IMMEDIATE MEDICAL CARE IF:   You have a fever.  You are leaking fluid from your vagina.  You have spotting or bleeding from your vagina.  You have severe abdominal cramping or pain.  You have rapid weight gain or loss.  You have shortness of breath with chest pain.  You notice sudden or extreme swelling of your face, hands, ankles, feet, or legs.  You have not felt your baby move in over an hour.  You have severe headaches that do not go away with medicine.  You have vision changes. Document Released: 08/18/2001 Document Revised: 04/26/2013 Document Reviewed: 10/25/2012 [employer] Patient Information 2014 [employer].  

## 2013-10-31 NOTE — Progress Notes (Signed)
P= 88 Edema in feet.  C/o of rash back left side of vagina; denies itchiness but states it does burn occasionally after using the bathroom.  C/o of itchiness all over and states hydroxizine is not working.

## 2013-10-31 NOTE — Progress Notes (Signed)
33 yo G3P2002 @ [redacted]w[redacted]d for ROBV 1) itching  - continued -hydroxizine not helping "all over"  2) burning, itching rash - states started all of a sudden on the left side of her vagina - mostly itches - some burning - no hx of HSV - put vaseline to sooth it - also having some vaginal discharge  O: see flowsheet Left labia with irritated skin and fissure lines with a couple areas of bleeding.  Some mild ulceration.   A/P 1) itching - no concerning findings on exam - will collect bile acids today given itching - trial of zantac also  2) labial lesion Candidal vs. Herpetic - pt adamant it is not herpes and states has had no exposure - wants to be sure prior to getting treatment - rx of diflucan.  - f/u HSV culture and treat if positive given pt preference   3) PTL precautions discussed 28 week labs done today

## 2013-11-01 LAB — RPR

## 2013-11-01 LAB — GLUCOSE TOLERANCE, 1 HOUR (50G) W/O FASTING: Glucose, 1 Hour GTT: 97 mg/dL (ref 70–140)

## 2013-11-01 LAB — HIV ANTIBODY (ROUTINE TESTING W REFLEX): HIV: NONREACTIVE

## 2013-11-02 LAB — BILE ACIDS, TOTAL: Bile Acids Total: 11 umol/L (ref 0–19)

## 2013-11-02 LAB — HERPES SIMPLEX VIRUS CULTURE: Organism ID, Bacteria: NOT DETECTED

## 2013-11-03 ENCOUNTER — Encounter: Payer: Self-pay | Admitting: Family Medicine

## 2013-11-06 ENCOUNTER — Telehealth: Payer: Self-pay

## 2013-11-06 NOTE — Telephone Encounter (Signed)
Pt. Called stating she was returning our call about results. Called pt. And informed her of normal bile acid level but of anemia and the need to take iron supplement BID. Informed pt. She can get this OTC at any pharmacy and that she may see on the label Ferrous Sulfate. Pt. Verbalized understanding and had no further questions or concerns.

## 2013-11-06 NOTE — Telephone Encounter (Signed)
Called pt. No answer. Left message stating we are calling with results please call clinic.

## 2013-11-06 NOTE — Telephone Encounter (Signed)
Message copied by Geanie Logan on Mon Nov 06, 2013  9:09 AM ------      Message from: Kassie Mends      Created: Fri Nov 03, 2013 10:35 AM       Pt with pretty significant anemia. Please let her know to start taking iron twice daily.  Also, her bile acid level is normal.  Will you ladies let her know? Thanks! ------

## 2013-11-07 ENCOUNTER — Encounter: Payer: 59 | Admitting: Advanced Practice Midwife

## 2013-11-24 ENCOUNTER — Telehealth: Payer: Self-pay

## 2013-11-24 NOTE — Telephone Encounter (Signed)
Pt. Called stating "I have questions about my time off and how to go about it" and would like a call back.   Attempted to call pt. No answer. Left message stating we are returning your call, call clinics.

## 2013-11-27 NOTE — Telephone Encounter (Signed)
Called patient no answer- left message that we are trying to return your phone call. If you still have questions or need assistance please call us back at the clinics. Patient has appt tomorrow 3/24

## 2013-11-28 ENCOUNTER — Encounter: Payer: 59 | Admitting: Advanced Practice Midwife

## 2013-11-28 ENCOUNTER — Encounter: Payer: Self-pay | Admitting: Family Medicine

## 2013-11-28 ENCOUNTER — Ambulatory Visit (INDEPENDENT_AMBULATORY_CARE_PROVIDER_SITE_OTHER): Payer: 59 | Admitting: Family Medicine

## 2013-11-28 VITALS — BP 117/69 | Temp 97.5°F | Wt 184.8 lb

## 2013-11-28 DIAGNOSIS — Z3480 Encounter for supervision of other normal pregnancy, unspecified trimester: Secondary | ICD-10-CM

## 2013-11-28 DIAGNOSIS — Z23 Encounter for immunization: Secondary | ICD-10-CM

## 2013-11-28 LAB — POCT URINALYSIS DIP (DEVICE)
Bilirubin Urine: NEGATIVE
Glucose, UA: NEGATIVE mg/dL
HGB URINE DIPSTICK: NEGATIVE
KETONES UR: NEGATIVE mg/dL
Nitrite: NEGATIVE
Protein, ur: NEGATIVE mg/dL
SPECIFIC GRAVITY, URINE: 1.015 (ref 1.005–1.030)
Urobilinogen, UA: 0.2 mg/dL (ref 0.0–1.0)
pH: 6 (ref 5.0–8.0)

## 2013-11-28 MED ORDER — TETANUS-DIPHTH-ACELL PERTUSSIS 5-2.5-18.5 LF-MCG/0.5 IM SUSP: 0.5000 mL | Freq: Once | INTRAMUSCULAR | Status: DC

## 2013-11-28 NOTE — Progress Notes (Signed)
+  FM,  No lof, no vb, occassional BH  No other complaints  Sylvia Fitzpatrick is a 33 y.o. G3P2002 at [redacted]w[redacted]d by R=10 here for ROB visit.  Discussed with Patient:  -Plans to breast feed.  All questions answered. -Continue prenatal vitamins. -Reviewed fetal kick counts Pt to perform daily at a time when the baby is active, lie laterally with both hands on belly in quiet room and count all movements (hiccups, shoulder rolls, obvious kicks, etc); pt is to report to clinic L&D for less than 10 movements felt in a one hour time period-pt told as soon as she counts 10 movements the count is complete.  - Routine precautions discussed (depression, infection s/s).   Patient provided with all pertinent phone numbers for emergencies. - RTC for any VB, regular, painful cramps/ctxs occurring at a rate of >2/10 min, fever (100.5 or higher), n/v/d, any pain that is unresolving or worsening, LOF, decreased fetal movement, CP, SOB, edema - RTC in 2 weeks for next appt.  Problems: Patient Active Problem List   Diagnosis Date Noted  . Supervision of other normal pregnancy 07/25/2013    To Do: 1. Tdap will be given today  [ ]  Vaccines: Flu:  Tdap: today [ ]  BCM: BTL [ ]  Readiness: baby has a place to sleep, car seat, other baby necessities.  Edu: [x ] PTL precautions; [ ]  BF class; [ ]  childbirth class; [ ]   BF counseling;

## 2013-11-28 NOTE — Patient Instructions (Signed)
Third Trimester of Pregnancy The third trimester is from week 29 through week 42, months 7 through 9. The third trimester is a time when the fetus is growing rapidly. At the end of the ninth month, the fetus is about 20 inches in length and weighs 6 10 pounds.  BODY CHANGES Your body goes through many changes during pregnancy. The changes vary from woman to woman.   Your weight will continue to increase. You can expect to gain 25 35 pounds (11 16 kg) by the end of the pregnancy.  You may begin to get stretch marks on your hips, abdomen, and breasts.  You may urinate more often because the fetus is moving lower into your pelvis and pressing on your bladder.  You may develop or continue to have heartburn as a result of your pregnancy.  You may develop constipation because certain hormones are causing the muscles that push waste through your intestines to slow down.  You may develop hemorrhoids or swollen, bulging veins (varicose veins).  You may have pelvic pain because of the weight gain and pregnancy hormones relaxing your joints between the bones in your pelvis. Back aches may result from over exertion of the muscles supporting your posture.  Your breasts will continue to grow and be tender. A yellow discharge may leak from your breasts called colostrum.  Your belly button may stick out.  You may feel short of breath because of your expanding uterus.  You may notice the fetus "dropping," or moving lower in your abdomen.  You may have a bloody mucus discharge. This usually occurs a few days to a week before labor begins.  Your cervix becomes thin and soft (effaced) near your due date. WHAT TO EXPECT AT YOUR PRENATAL EXAMS  You will have prenatal exams every 2 weeks until week 36. Then, you will have weekly prenatal exams. During a routine prenatal visit:  You will be weighed to make sure you and the fetus are growing normally.  Your blood pressure is taken.  Your abdomen will be  measured to track your baby's growth.  The fetal heartbeat will be listened to.  Any test results from the previous visit will be discussed.  You may have a cervical check near your due date to see if you have effaced. At around 36 weeks, your caregiver will check your cervix. At the same time, your caregiver will also perform a test on the secretions of the vaginal tissue. This test is to determine if a type of bacteria, Group B streptococcus, is present. Your caregiver will explain this further. Your caregiver may ask you:  What your birth plan is.  How you are feeling.  If you are feeling the baby move.  If you have had any abnormal symptoms, such as leaking fluid, bleeding, severe headaches, or abdominal cramping.  If you have any questions. Other tests or screenings that may be performed during your third trimester include:  Blood tests that check for low iron levels (anemia).  Fetal testing to check the health, activity level, and growth of the fetus. Testing is done if you have certain medical conditions or if there are problems during the pregnancy. FALSE LABOR You may feel small, irregular contractions that eventually go away. These are called Braxton Hicks contractions, or false labor. Contractions may last for hours, days, or even weeks before true labor sets in. If contractions come at regular intervals, intensify, or become painful, it is best to be seen by your caregiver.  SIGNS OF LABOR   Menstrual-like cramps.  Contractions that are 5 minutes apart or less.  Contractions that start on the top of the uterus and spread down to the lower abdomen and back.  A sense of increased pelvic pressure or back pain.  A watery or bloody mucus discharge that comes from the vagina. If you have any of these signs before the 37th week of pregnancy, call your caregiver right away. You need to go to the hospital to get checked immediately. HOME CARE INSTRUCTIONS   Avoid all  smoking, herbs, alcohol, and unprescribed drugs. These chemicals affect the formation and growth of the baby.  Follow your caregiver's instructions regarding medicine use. There are medicines that are either safe or unsafe to take during pregnancy.  Exercise only as directed by your caregiver. Experiencing uterine cramps is a good sign to stop exercising.  Continue to eat regular, healthy meals.  Wear a good support bra for breast tenderness.  Do not use hot tubs, steam rooms, or saunas.  Wear your seat belt at all times when driving.  Avoid raw meat, uncooked cheese, cat litter boxes, and soil used by cats. These carry germs that can cause birth defects in the baby.  Take your prenatal vitamins.  Try taking a stool softener (if your caregiver approves) if you develop constipation. Eat more high-fiber foods, such as fresh vegetables or fruit and whole grains. Drink plenty of fluids to keep your urine clear or pale yellow.  Take warm sitz baths to soothe any pain or discomfort caused by hemorrhoids. Use hemorrhoid cream if your caregiver approves.  If you develop varicose veins, wear support hose. Elevate your feet for 15 minutes, 3 4 times a day. Limit salt in your diet.  Avoid heavy lifting, wear low heal shoes, and practice good posture.  Rest a lot with your legs elevated if you have leg cramps or low back pain.  Visit your dentist if you have not gone during your pregnancy. Use a soft toothbrush to brush your teeth and be gentle when you floss.  A sexual relationship may be continued unless your caregiver directs you otherwise.  Do not travel far distances unless it is absolutely necessary and only with the approval of your caregiver.  Take prenatal classes to understand, practice, and ask questions about the labor and delivery.  Make a trial run to the hospital.  Pack your hospital bag.  Prepare the baby's nursery.  Continue to go to all your prenatal visits as directed  by your caregiver. SEEK MEDICAL CARE IF:  You are unsure if you are in labor or if your water has broken.  You have dizziness.  You have mild pelvic cramps, pelvic pressure, or nagging pain in your abdominal area.  You have persistent nausea, vomiting, or diarrhea.  You have a bad smelling vaginal discharge.  You have pain with urination. SEEK IMMEDIATE MEDICAL CARE IF:   You have a fever.  You are leaking fluid from your vagina.  You have spotting or bleeding from your vagina.  You have severe abdominal cramping or pain.  You have rapid weight loss or gain.  You have shortness of breath with chest pain.  You notice sudden or extreme swelling of your face, hands, ankles, feet, or legs.  You have not felt your baby move in over an hour.  You have severe headaches that do not go away with medicine.  You have vision changes. Document Released: 08/18/2001 Document Revised: 04/26/2013 Document Reviewed:   You have severe abdominal cramping or pain.   You have rapid weight loss or gain.   You have shortness of breath with chest pain.   You notice sudden or extreme swelling of your face, hands, ankles, feet, or legs.   You have not felt your baby move in over an hour.   You have severe headaches that do not go away with medicine.   You have vision changes.  Document Released: 08/18/2001 Document Revised: 04/26/2013 Document Reviewed: 10/25/2012  [employer] Patient Information 2014 [employer].

## 2013-11-28 NOTE — Progress Notes (Signed)
P= 76 Edema in feet.  C/o of intermittent lower abdominal/pelvic pressure and braxton hicks.  tdap today.

## 2013-12-12 ENCOUNTER — Ambulatory Visit (INDEPENDENT_AMBULATORY_CARE_PROVIDER_SITE_OTHER): Payer: 59 | Admitting: Obstetrics and Gynecology

## 2013-12-12 VITALS — BP 115/69 | Temp 97.7°F | Wt 191.9 lb

## 2013-12-12 DIAGNOSIS — Z3480 Encounter for supervision of other normal pregnancy, unspecified trimester: Secondary | ICD-10-CM

## 2013-12-12 LAB — POCT URINALYSIS DIP (DEVICE)
Bilirubin Urine: NEGATIVE
Glucose, UA: NEGATIVE mg/dL
HGB URINE DIPSTICK: NEGATIVE
Ketones, ur: NEGATIVE mg/dL
Nitrite: NEGATIVE
PH: 7 (ref 5.0–8.0)
PROTEIN: NEGATIVE mg/dL
SPECIFIC GRAVITY, URINE: 1.015 (ref 1.005–1.030)
UROBILINOGEN UA: 0.2 mg/dL (ref 0.0–1.0)

## 2013-12-12 NOTE — Progress Notes (Signed)
P=  C/o worsening pressure /pelvic pain- states not sure if having contractions or not- just knows it hurts.

## 2013-12-12 NOTE — Progress Notes (Signed)
Lower abd pain with walking. Abd tightening a few times a day.  Needs consent for BTL

## 2013-12-12 NOTE — Patient Instructions (Signed)
Third Trimester of Pregnancy The third trimester is from week 29 through week 42, months 7 through 9. The third trimester is a time when the fetus is growing rapidly. At the end of the ninth month, the fetus is about 20 inches in length and weighs 6 10 pounds.  BODY CHANGES Your body goes through many changes during pregnancy. The changes vary from woman to woman.   Your weight will continue to increase. You can expect to gain 25 35 pounds (11 16 kg) by the end of the pregnancy.  You may begin to get stretch marks on your hips, abdomen, and breasts.  You may urinate more often because the fetus is moving lower into your pelvis and pressing on your bladder.  You may develop or continue to have heartburn as a result of your pregnancy.  You may develop constipation because certain hormones are causing the muscles that push waste through your intestines to slow down.  You may develop hemorrhoids or swollen, bulging veins (varicose veins).  You may have pelvic pain because of the weight gain and pregnancy hormones relaxing your joints between the bones in your pelvis. Back aches may result from over exertion of the muscles supporting your posture.  Your breasts will continue to grow and be tender. A yellow discharge may leak from your breasts called colostrum.  Your belly button may stick out.  You may feel short of breath because of your expanding uterus.  You may notice the fetus "dropping," or moving lower in your abdomen.  You may have a bloody mucus discharge. This usually occurs a few days to a week before labor begins.  Your cervix becomes thin and soft (effaced) near your due date. WHAT TO EXPECT AT YOUR PRENATAL EXAMS  You will have prenatal exams every 2 weeks until week 36. Then, you will have weekly prenatal exams. During a routine prenatal visit:  You will be weighed to make sure you and the fetus are growing normally.  Your blood pressure is taken.  Your abdomen will be  measured to track your baby's growth.  The fetal heartbeat will be listened to.  Any test results from the previous visit will be discussed.  You may have a cervical check near your due date to see if you have effaced. At around 36 weeks, your caregiver will check your cervix. At the same time, your caregiver will also perform a test on the secretions of the vaginal tissue. This test is to determine if a type of bacteria, Group B streptococcus, is present. Your caregiver will explain this further. Your caregiver may ask you:  What your birth plan is.  How you are feeling.  If you are feeling the baby move.  If you have had any abnormal symptoms, such as leaking fluid, bleeding, severe headaches, or abdominal cramping.  If you have any questions. Other tests or screenings that may be performed during your third trimester include:  Blood tests that check for low iron levels (anemia).  Fetal testing to check the health, activity level, and growth of the fetus. Testing is done if you have certain medical conditions or if there are problems during the pregnancy. FALSE LABOR You may feel small, irregular contractions that eventually go away. These are called Braxton Hicks contractions, or false labor. Contractions may last for hours, days, or even weeks before true labor sets in. If contractions come at regular intervals, intensify, or become painful, it is best to be seen by your caregiver.  SIGNS OF LABOR   Menstrual-like cramps.  Contractions that are 5 minutes apart or less.  Contractions that start on the top of the uterus and spread down to the lower abdomen and back.  A sense of increased pelvic pressure or back pain.  A watery or bloody mucus discharge that comes from the vagina. If you have any of these signs before the 37th week of pregnancy, call your caregiver right away. You need to go to the hospital to get checked immediately. HOME CARE INSTRUCTIONS   Avoid all  smoking, herbs, alcohol, and unprescribed drugs. These chemicals affect the formation and growth of the baby.  Follow your caregiver's instructions regarding medicine use. There are medicines that are either safe or unsafe to take during pregnancy.  Exercise only as directed by your caregiver. Experiencing uterine cramps is a good sign to stop exercising.  Continue to eat regular, healthy meals.  Wear a good support bra for breast tenderness.  Do not use hot tubs, steam rooms, or saunas.  Wear your seat belt at all times when driving.  Avoid raw meat, uncooked cheese, cat litter boxes, and soil used by cats. These carry germs that can cause birth defects in the baby.  Take your prenatal vitamins.  Try taking a stool softener (if your caregiver approves) if you develop constipation. Eat more high-fiber foods, such as fresh vegetables or fruit and whole grains. Drink plenty of fluids to keep your urine clear or pale yellow.  Take warm sitz baths to soothe any pain or discomfort caused by hemorrhoids. Use hemorrhoid cream if your caregiver approves.  If you develop varicose veins, wear support hose. Elevate your feet for 15 minutes, 3 4 times a day. Limit salt in your diet.  Avoid heavy lifting, wear low heal shoes, and practice good posture.  Rest a lot with your legs elevated if you have leg cramps or low back pain.  Visit your dentist if you have not gone during your pregnancy. Use a soft toothbrush to brush your teeth and be gentle when you floss.  A sexual relationship may be continued unless your caregiver directs you otherwise.  Do not travel far distances unless it is absolutely necessary and only with the approval of your caregiver.  Take prenatal classes to understand, practice, and ask questions about the labor and delivery.  Make a trial run to the hospital.  Pack your hospital bag.  Prepare the baby's nursery.  Continue to go to all your prenatal visits as directed  by your caregiver. SEEK MEDICAL CARE IF:  You are unsure if you are in labor or if your water has broken.  You have dizziness.  You have mild pelvic cramps, pelvic pressure, or nagging pain in your abdominal area.  You have persistent nausea, vomiting, or diarrhea.  You have a bad smelling vaginal discharge.  You have pain with urination. SEEK IMMEDIATE MEDICAL CARE IF:   You have a fever.  You are leaking fluid from your vagina.  You have spotting or bleeding from your vagina.  You have severe abdominal cramping or pain.  You have rapid weight loss or gain.  You have shortness of breath with chest pain.  You notice sudden or extreme swelling of your face, hands, ankles, feet, or legs.  You have not felt your baby move in over an hour.  You have severe headaches that do not go away with medicine.  You have vision changes. Document Released: 08/18/2001 Document Revised: 04/26/2013 Document Reviewed:   You have severe abdominal cramping or pain.   You have rapid weight loss or gain.   You have shortness of breath with chest pain.   You notice sudden or extreme swelling of your face, hands, ankles, feet, or legs.   You have not felt your baby move in over an hour.   You have severe headaches that do not go away with medicine.   You have vision changes.  Document Released: 08/18/2001 Document Revised: 04/26/2013 Document Reviewed: 10/25/2012  [employer] Patient Information 2014 [employer].

## 2013-12-25 ENCOUNTER — Encounter (HOSPITAL_COMMUNITY): Payer: Self-pay | Admitting: *Deleted

## 2013-12-25 ENCOUNTER — Inpatient Hospital Stay (HOSPITAL_COMMUNITY)
Admission: AD | Admit: 2013-12-25 | Discharge: 2013-12-25 | Disposition: A | Discharge status: Home / Self Care | Payer: 59 | Source: Ambulatory Visit | Attending: Obstetrics & Gynecology | Admitting: Obstetrics & Gynecology

## 2013-12-25 DIAGNOSIS — O2390 Unspecified genitourinary tract infection in pregnancy, unspecified trimester: Secondary | ICD-10-CM | POA: Insufficient documentation

## 2013-12-25 DIAGNOSIS — N390 Urinary tract infection, site not specified: Secondary | ICD-10-CM | POA: Insufficient documentation

## 2013-12-25 DIAGNOSIS — O2343 Unspecified infection of urinary tract in pregnancy, third trimester: Secondary | ICD-10-CM

## 2013-12-25 DIAGNOSIS — O4700 False labor before 37 completed weeks of gestation, unspecified trimester: Secondary | ICD-10-CM | POA: Insufficient documentation

## 2013-12-25 LAB — WET PREP, GENITAL
TRICH WET PREP: NONE SEEN
YEAST WET PREP: NONE SEEN

## 2013-12-25 LAB — URINALYSIS, ROUTINE W REFLEX MICROSCOPIC
BILIRUBIN URINE: NEGATIVE
GLUCOSE, UA: NEGATIVE mg/dL
Ketones, ur: NEGATIVE mg/dL
Nitrite: NEGATIVE
PROTEIN: NEGATIVE mg/dL
Specific Gravity, Urine: 1.01 (ref 1.005–1.030)
Urobilinogen, UA: 0.2 mg/dL (ref 0.0–1.0)
pH: 6.5 (ref 5.0–8.0)

## 2013-12-25 LAB — URINE MICROSCOPIC-ADD ON

## 2013-12-25 LAB — AMNISURE RUPTURE OF MEMBRANE (ROM) NOT AT ARMC: AMNISURE: NEGATIVE

## 2013-12-25 LAB — FETAL FIBRONECTIN: Fetal Fibronectin: NEGATIVE

## 2013-12-25 MED ORDER — NITROFURANTOIN MONOHYD MACRO 100 MG PO CAPS: 100.0000 mg | ORAL_CAPSULE | Freq: Two times a day (BID) | ORAL | Status: DC

## 2013-12-25 NOTE — Discharge Instructions (Signed)
Pregnancy and Urinary Tract Infection °A urinary tract infection (UTI) is a bacterial infection of the urinary tract. Infection of the urinary tract can include the ureters, kidneys (pyelonephritis), bladder (cystitis), and urethra (urethritis). All pregnant women should be screened for bacteria in the urinary tract. Identifying and treating a UTI will decrease the risk of preterm labor and developing more serious infections in both the mother and baby. °CAUSES °Bacteria germs cause almost all UTIs.  °RISK FACTORS °Many factors can increase your chances of getting a UTI during pregnancy. These include: °· Having a short urethra. °· Poor toilet and hygiene habits. °· Sexual intercourse. °· Blockage of urine along the urinary tract. °· Problems with the pelvic muscles or nerves. °· Diabetes. °· Obesity. °· Bladder problems after having several children. °· Previous history of UTI. °SIGNS AND SYMPTOMS  °· Pain, burning, or a stinging feeling when urinating. °· Suddenly feeling the need to urinate right away (urgency). °· Loss of bladder control (urinary incontinence). °· Frequent urination, more than is common with pregnancy. °· Lower abdominal or back discomfort. °· Cloudy urine. °· Blood in the urine (hematuria). °· Fever.  °When the kidneys are infected, the symptoms may be: °· Back pain. °· Flank pain on the right side more so than the left. °· Fever. °· Chills. °· Nausea. °· Vomiting. °DIAGNOSIS  °A urinary tract infection is usually diagnosed through urine tests. Additional tests and procedures are sometimes done. These may include: °· Ultrasound exam of the kidneys, ureters, bladder, and urethra. °· Looking in the bladder with a lighted tube (cystoscopy). °TREATMENT °Typically, UTIs can be treated with antibiotic medicines.  °HOME CARE INSTRUCTIONS  °· Only take over-the-counter or prescription medicines as directed by your health care provider. If you were prescribed antibiotics, take them as directed. Finish  them even if you start to feel better. °· Drink enough fluids to keep your urine clear or pale yellow. °· Do not have sexual intercourse until the infection is gone and your health care provider says it is okay. °· Make sure you are tested for UTIs throughout your pregnancy. These infections often come back.  °Preventing a UTI in the Future °· Practice good toilet habits. Always wipe from front to back. Use the tissue only once. °· Do not hold your urine. Empty your bladder as soon as possible when the urge comes. °· Do not douche or use deodorant sprays. °· Wash with soap and warm water around the genital area and the anus. °· Empty your bladder before and after sexual intercourse. °· Wear underwear with a cotton crotch. °· Avoid caffeine and carbonated drinks. They can irritate the bladder. °· Drink cranberry juice or take cranberry pills. This may decrease the risk of getting a UTI. °· Do not drink alcohol. °· Keep all your appointments and tests as scheduled.  °SEEK MEDICAL CARE IF:  °· Your symptoms get worse. °· You are still having fevers 2 or more days after treatment begins. °· You have a rash. °· You feel that you are having problems with medicines prescribed. °· You have abnormal vaginal discharge. °SEEK IMMEDIATE MEDICAL CARE IF:  °· You have back or flank pain. °· You have chills. °· You have blood in your urine. °· You have nausea and vomiting. °· You have contractions of your uterus. °· You have a gush of fluid from the vagina. °MAKE SURE YOU: °· Understand these instructions.   °· Will watch your condition.   °· Will get help right away if you are not doing   well or get worse.  Document Released: 12/19/2010 Document Revised: 06/14/2013 Document Reviewed: 03/23/2013 [facility] Patient Information 2014 [place], [place]. Preterm Labor Information Preterm labor is when labor starts at less than 37 weeks of pregnancy. The normal length of a pregnancy is 39 to 41 weeks. CAUSES Often, there is no  identifiable underlying cause as to why a woman goes into preterm labor. One of the most common known causes of preterm labor is infection. Infections of the uterus, cervix, vagina, amniotic sac, bladder, kidney, or even the lungs (pneumonia) can cause labor to start. Other suspected causes of preterm labor include:   Urogenital infections, such as yeast infections and bacterial vaginosis.   Uterine abnormalities (uterine shape, uterine septum, fibroids, or bleeding from the placenta).   A cervix that has been operated on (it may fail to stay closed).   Malformations in the fetus.   Multiple gestations (twins, triplets, and so on).   Breakage of the amniotic sac.  RISK FACTORS  Having a previous history of preterm labor.   Having premature rupture of membranes (PROM).   Having a placenta that covers the opening of the cervix (placenta previa).   Having a placenta that separates from the uterus (placental abruption).   Having a cervix that is too weak to hold the fetus in the uterus (incompetent cervix).   Having too much fluid in the amniotic sac (polyhydramnios).   Taking illegal drugs or smoking while pregnant.   Not gaining enough weight while pregnant.   Being younger than 59 and older than 32 years old.   Having a low socioeconomic status.   Being African American. SYMPTOMS Signs and symptoms of preterm labor include:   Menstrual-like cramps, abdominal pain, or back pain.  Uterine contractions that are regular, as frequent as six in an hour, regardless of their intensity (may be mild or painful).  Contractions that start on the top of the uterus and spread down to the lower abdomen and back.   A sense of increased pelvic pressure.   A watery or bloody mucus discharge that comes from the vagina.  TREATMENT Depending on the length of the pregnancy and other circumstances, your health care provider may suggest bed rest. If necessary, there are  medicines that can be given to stop contractions and to mature the fetal lungs. If labor happens before 34 weeks of pregnancy, a prolonged hospital stay may be recommended. Treatment depends on the condition of both you and the fetus.  WHAT SHOULD YOU DO IF YOU THINK YOU ARE IN PRETERM LABOR? Call your health care provider right away. You will need to go to the hospital to get checked immediately. HOW CAN YOU PREVENT PRETERM LABOR IN FUTURE PREGNANCIES? You should:   Stop smoking if you smoke.  Maintain healthy weight gain and avoid chemicals and drugs that are not necessary.  Be watchful for any type of infection.  Inform your health care provider if you have a known history of preterm labor. Document Released: 11/14/2003 Document Revised: 04/26/2013 Document Reviewed: 09/26/2012 [facility] Patient Information 2014 [place], [place].

## 2013-12-25 NOTE — MAU Note (Signed)
Pelvic pressure for the last month, SVE 1 cm in clinic 2 weeks ago, pressure getting progressively worse, feeling tightening.  Has felt like underwear has been wet for past month.  Denies bleeding.

## 2013-12-25 NOTE — MAU Provider Note (Signed)
Chief Complaint:  pelvic pressure  and leaking of fluid    None     HPI: Sylvia Fitzpatrick is a 33 y.o. G3P2002 at [redacted]w[redacted]d who presents to maternity admissions reporting pelvic pressure and leakage of fluid making her underwear wet, both symptoms for >1 month but worsening last few days.  She was 1 cm dilated at her last OB visit and is concerned about this.  She reports good fetal movement, denies regular contractions, vaginal bleeding, vaginal itching/burning, urinary symptoms, h/a, dizziness, n/v, or fever/chills.     Past Medical History: Past Medical History  Diagnosis Date  . No pertinent past medical history   . Headache(784.0)     Prior hx migraines  . PONV (postoperative nausea and vomiting)     n/v after iv anesthetic    Past obstetric history: OB History  Gravida Para Term Preterm AB SAB TAB Ectopic Multiple Living  3 2 2  0 0 0 0 0 0 2    # Outcome Date GA Lbr Len/2nd Weight Sex Delivery Anes PTL Lv  3 CUR           2 TRM 11/22/10 [redacted]w[redacted]d  3.033 kg (6 lb 11 oz) M SVD EPI  Y  1 TRM 12/25/04 [redacted]w[redacted]d  3.062 kg (6 lb 12 oz) F SVD EPI  Y      Past Surgical History: Past Surgical History  Procedure Laterality Date  . Multiple tooth extractions      Family History: History reviewed. No pertinent family history.  Social History: History  Substance Use Topics  . Smoking status: Never Smoker   . Smokeless tobacco: Never Used  . Alcohol Use: No    Allergies: No Known Allergies  Meds:  Facility-administered medications prior to admission  Medication Dose Route Frequency Provider Last Rate Last Dose  . Tdap (BOOSTRIX) injection 0.5 mL  0.5 mL Intramuscular Once Allen Norris, MD       Prescriptions prior to admission  Medication Sig Dispense Refill  . Prenatal Vit-Fe Fumarate-FA (MULTIVITAMIN-PRENATAL) 27-0.8 MG TABS tablet Take 1 tablet by mouth daily at 12 noon.        ROS: Pertinent findings in history of present illness.  Physical Exam  Blood pressure 119/66,  pulse 78, temperature 98 F (36.7 C), temperature source Oral, resp. rate 20, last menstrual period 05/12/2013. GENERAL: Well-developed, well-nourished female in no acute distress.  HEENT: normocephalic HEART: normal rate RESP: normal effort ABDOMEN: Soft, non-tender, gravid appropriate for gestational age EXTREMITIES: Nontender, no edema NEURO: alert and oriented Pelvic exam: Cervix pink, visually closed, without lesion, large amount creamy white discharge, vaginal walls and external genitalia normal  Dilation: 1 Effacement (%): 30 Cervical Position: Posterior Station: Ballotable Exam by:: L. Leftwich-Kirby CNM  FHT:  Baseline 135 , moderate variability, accelerations present, no decelerations Contractions: rare, mild to palpation   Labs: Results for orders placed during the hospital encounter of 12/25/13 (from the past 24 hour(s))  URINALYSIS, ROUTINE W REFLEX MICROSCOPIC     Status: Abnormal   Collection Time    12/25/13  8:56 AM      Result Value Ref Range   Color, Urine YELLOW  YELLOW   APPearance CLEAR  CLEAR   Specific Gravity, Urine 1.010  1.005 - 1.030   pH 6.5  5.0 - 8.0   Glucose, UA NEGATIVE  NEGATIVE mg/dL   Hgb urine dipstick TRACE (*) NEGATIVE   Bilirubin Urine NEGATIVE  NEGATIVE   Ketones, ur NEGATIVE  NEGATIVE mg/dL  Protein, ur NEGATIVE  NEGATIVE mg/dL   Urobilinogen, UA 0.2  0.0 - 1.0 mg/dL   Nitrite NEGATIVE  NEGATIVE   Leukocytes, UA LARGE (*) NEGATIVE  URINE MICROSCOPIC-ADD ON     Status: Abnormal   Collection Time    12/25/13  8:56 AM      Result Value Ref Range   Squamous Epithelial / LPF RARE  RARE   WBC, UA 7-10  <3 WBC/hpf   Bacteria, UA FEW (*) RARE  WET PREP, GENITAL     Status: Abnormal   Collection Time    12/25/13 10:00 AM      Result Value Ref Range   Yeast Wet Prep HPF POC NONE SEEN  NONE SEEN   Trich, Wet Prep NONE SEEN  NONE SEEN   Clue Cells Wet Prep HPF POC FEW (*) NONE SEEN   WBC, Wet Prep HPF POC MANY (*) NONE SEEN   AMNISURE RUPTURE OF MEMBRANE (ROM)     Status: None   Collection Time    12/25/13 10:00 AM      Result Value Ref Range   Amnisure ROM NEGATIVE    FETAL FIBRONECTIN     Status: None   Collection Time    12/25/13 10:00 AM      Result Value Ref Range   Fetal Fibronectin NEGATIVE  NEGATIVE      Assessment: 1. UTI (urinary tract infection) in pregnancy in third trimester   2. Threatened preterm labor     Plan: Discharge home PTL precautions and fetal kick counts Urine sent for culture Macrobid 100 mg BID x 7 days F/U as schedule in clinic Return to MAU as needed for emergencies      Follow-up Information   Follow up with [facility]. (on Wednesday as scheduled.  Return to MAU as needed.)    Specialty:  Obstetrics and Gynecology   Contact information:   [place] [address] [phone]       Medication List         multivitamin-prenatal 27-0.8 MG Tabs tablet  Take 1 tablet by mouth daily at 12 noon.     nitrofurantoin (macrocrystal-monohydrate) 100 MG capsule  Commonly known as:  MACROBID  Take 1 capsule (100 mg total) by mouth 2 (two) times daily.        Fatima Blank Certified Nurse-Midwife 12/25/2013 11:15 AM

## 2013-12-25 NOTE — MAU Provider Note (Signed)
Attestation of Attending Supervision of Advanced Practitioner (CNM/NP): Evaluation and management procedures were performed by the Advanced Practitioner under my supervision and collaboration. I have reviewed the Advanced Practitioner's note and chart, and I agree with the management and plan.  Fredderick Phenix Leggett 1:26 PM

## 2013-12-26 LAB — URINE CULTURE
CULTURE: NO GROWTH
Colony Count: NO GROWTH

## 2013-12-27 ENCOUNTER — Ambulatory Visit (INDEPENDENT_AMBULATORY_CARE_PROVIDER_SITE_OTHER): Payer: 59 | Admitting: Advanced Practice Midwife

## 2013-12-27 ENCOUNTER — Encounter: Payer: Self-pay | Admitting: Advanced Practice Midwife

## 2013-12-27 ENCOUNTER — Encounter: Payer: Self-pay | Admitting: *Deleted

## 2013-12-27 VITALS — BP 111/66 | HR 81 | Temp 97.2°F | Wt 189.3 lb

## 2013-12-27 DIAGNOSIS — Z3480 Encounter for supervision of other normal pregnancy, unspecified trimester: Secondary | ICD-10-CM

## 2013-12-27 LAB — POCT URINALYSIS DIP (DEVICE)
Bilirubin Urine: NEGATIVE
Glucose, UA: NEGATIVE mg/dL
HGB URINE DIPSTICK: NEGATIVE
Ketones, ur: NEGATIVE mg/dL
NITRITE: NEGATIVE
PROTEIN: NEGATIVE mg/dL
Specific Gravity, Urine: 1.015 (ref 1.005–1.030)
UROBILINOGEN UA: 0.2 mg/dL (ref 0.0–1.0)
pH: 6 (ref 5.0–8.0)

## 2013-12-27 NOTE — Progress Notes (Signed)
Still having pressure and some contractions. Labor precautions   BTL consent signed 12/12/13

## 2013-12-27 NOTE — Progress Notes (Signed)
Patient reports pelvic pressure and a pulling sensation as well as some contractions here and there

## 2013-12-27 NOTE — Patient Instructions (Addendum)
Third Trimester of Pregnancy The third trimester is from week 29 through week 42, months 7 through 9. The third trimester is a time when the fetus is growing rapidly. At the end of the ninth month, the fetus is about 20 inches in length and weighs 6 10 pounds.  BODY CHANGES Your body goes through many changes during pregnancy. The changes vary from woman to woman.   Your weight will continue to increase. You can expect to gain 25 35 pounds (11 16 kg) by the end of the pregnancy.  You may begin to get stretch marks on your hips, abdomen, and breasts.  You may urinate more often because the fetus is moving lower into your pelvis and pressing on your bladder.  You may develop or continue to have heartburn as a result of your pregnancy.  You may develop constipation because certain hormones are causing the muscles that push waste through your intestines to slow down.  You may develop hemorrhoids or swollen, bulging veins (varicose veins).  You may have pelvic pain because of the weight gain and pregnancy hormones relaxing your joints between the bones in your pelvis. Back aches may result from over exertion of the muscles supporting your posture.  Your breasts will continue to grow and be tender. A yellow discharge may leak from your breasts called colostrum.  Your belly button may stick out.  You may feel short of breath because of your expanding uterus.  You may notice the fetus "dropping," or moving lower in your abdomen.  You may have a bloody mucus discharge. This usually occurs a few days to a week before labor begins.  Your cervix becomes thin and soft (effaced) near your due date. WHAT TO EXPECT AT YOUR PRENATAL EXAMS  You will have prenatal exams every 2 weeks until week 36. Then, you will have weekly prenatal exams. During a routine prenatal visit:  You will be weighed to make sure you and the fetus are growing normally.  Your blood pressure is taken.  Your abdomen will be  measured to track your baby's growth.  The fetal heartbeat will be listened to.  Any test results from the previous visit will be discussed.  You may have a cervical check near your due date to see if you have effaced. At around 36 weeks, your caregiver will check your cervix. At the same time, your caregiver will also perform a test on the secretions of the vaginal tissue. This test is to determine if a type of bacteria, Group B streptococcus, is present. Your caregiver will explain this further. Your caregiver may ask you:  What your birth plan is.  How you are feeling.  If you are feeling the baby move.  If you have had any abnormal symptoms, such as leaking fluid, bleeding, severe headaches, or abdominal cramping.  If you have any questions. Other tests or screenings that may be performed during your third trimester include:  Blood tests that check for low iron levels (anemia).  Fetal testing to check the health, activity level, and growth of the fetus. Testing is done if you have certain medical conditions or if there are problems during the pregnancy. FALSE LABOR You may feel small, irregular contractions that eventually go away. These are called Braxton Hicks contractions, or false labor. Contractions may last for hours, days, or even weeks before true labor sets in. If contractions come at regular intervals, intensify, or become painful, it is best to be seen by your caregiver.  SIGNS OF LABOR   Menstrual-like cramps.  Contractions that are 5 minutes apart or less.  Contractions that start on the top of the uterus and spread down to the lower abdomen and back.  A sense of increased pelvic pressure or back pain.  A watery or bloody mucus discharge that comes from the vagina. If you have any of these signs before the 37th week of pregnancy, call your caregiver right away. You need to go to the hospital to get checked immediately. HOME CARE INSTRUCTIONS   Avoid all  smoking, herbs, alcohol, and unprescribed drugs. These chemicals affect the formation and growth of the baby.  Follow your caregiver's instructions regarding medicine use. There are medicines that are either safe or unsafe to take during pregnancy.  Exercise only as directed by your caregiver. Experiencing uterine cramps is a good sign to stop exercising.  Continue to eat regular, healthy meals.  Wear a good support bra for breast tenderness.  Do not use hot tubs, steam rooms, or saunas.  Wear your seat belt at all times when driving.  Avoid raw meat, uncooked cheese, cat litter boxes, and soil used by cats. These carry germs that can cause birth defects in the baby.  Take your prenatal vitamins.  Try taking a stool softener (if your caregiver approves) if you develop constipation. Eat more high-fiber foods, such as fresh vegetables or fruit and whole grains. Drink plenty of fluids to keep your urine clear or pale yellow.  Take warm sitz baths to soothe any pain or discomfort caused by hemorrhoids. Use hemorrhoid cream if your caregiver approves.  If you develop varicose veins, wear support hose. Elevate your feet for 15 minutes, 3 4 times a day. Limit salt in your diet.  Avoid heavy lifting, wear low heal shoes, and practice good posture.  Rest a lot with your legs elevated if you have leg cramps or low back pain.  Visit your dentist if you have not gone during your pregnancy. Use a soft toothbrush to brush your teeth and be gentle when you floss.  A sexual relationship may be continued unless your caregiver directs you otherwise.  Do not travel far distances unless it is absolutely necessary and only with the approval of your caregiver.  Take prenatal classes to understand, practice, and ask questions about the labor and delivery.  Make a trial run to the hospital.  Pack your hospital bag.  Prepare the baby's nursery.  Continue to go to all your prenatal visits as directed  by your caregiver. SEEK MEDICAL CARE IF:  You are unsure if you are in labor or if your water has broken.  You have dizziness.  You have mild pelvic cramps, pelvic pressure, or nagging pain in your abdominal area.  You have persistent nausea, vomiting, or diarrhea.  You have a bad smelling vaginal discharge.  You have pain with urination. SEEK IMMEDIATE MEDICAL CARE IF:   You have a fever.  You are leaking fluid from your vagina.  You have spotting or bleeding from your vagina.  You have severe abdominal cramping or pain.  You have rapid weight loss or gain.  You have shortness of breath with chest pain.  You notice sudden or extreme swelling of your face, hands, ankles, feet, or legs.  You have not felt your baby move in over an hour.  You have severe headaches that do not go away with medicine.  You have vision changes. Document Released: 08/18/2001 Document Revised: 04/26/2013 Document Reviewed:   10/25/2012 [employer] Patient Information 2014 [place]. Sterilization Information, Female Female sterilization is a procedure to permanently prevent pregnancy. There are different ways to perform sterilization, but all either block or close the fallopian tubes so that your eggs cannot reach your uterus. If your egg cannot reach your uterus, sperm cannot fertilize the egg, and you cannot get pregnant.  Sterilization is performed by a surgical procedure. Sometimes these procedures are performed in a hospital while a patient is asleep. Sometimes they can be done in a clinic setting with the patient awake. The fallopian tubes can be surgically cut, tied, or sealed through a procedure called tubal ligation. The fallopian tubes can also be closed with clips or rings. Sterilization can also be done by placing a tiny coil into each fallopian tube, which causes scar tissue to grow inside the tube. The scar tissue then blocks the tubes.  Discuss sterilization with your caregiver to  answer any concerns you or your partner may have. You may want to ask what type of sterilization your caregiver performs. Some caregivers may not perform all the various options. Sterilization is permanent and should only be done if you are sure you do not want children or do not want any more children. Having a sterilization reversed may not be successful.  STERILIZATION PROCEDURES  Laparoscopic sterilization. This is a surgical method performed at a time other than right after childbirth. Two incisions are made in the lower abdomen. A thin, lighted tube (laparoscope) is inserted into one of the incisions and is used to perform the procedure. The fallopian tubes are closed with a ring or a clip. An instrument that uses heat could be used to seal the tubes closed (electrocautery).   Mini-laparotomy. This is a surgical method done 1 or 2 days after giving birth. Typically, a small incision is made just below the belly button (umbilicus) and the fallopian tubes are exposed. The tubes can then be sealed, tied, or cut.   Hysteroscopic sterilization. This is performed at a time other than right after childbirth. A tiny, spring-like coil is inserted through the cervix and uterus and placed into the fallopian tubes. The coil causes scaring and blocks the tubes. Other forms of contraception should be used for 3 months after the procedure to allow the scar tissue to form completely. Additionally, it is required hysterosalpingography be done 3 months later to ensure that the procedure was successful. Hysterosalpingography is a procedure that uses X-rays to look at your uterus and fallopian tubes after a material to make them show up better has been inserted. IS STERILIZATION SAFE? Sterilization is considered safe with very rare complications. Risks depend on the type of procedure you have. As with any surgical procedure, there are risks. Some risks of sterilization by any means include:    Bleeding.  Infection.  Reaction to anesthesia medicine.  Injury to surrounding organs. Risks specific to having hysteroscopic coils placed include:  The coils may not be placed correctly the first time.   The coils may move out of place.   The tubes may not get completely blocked after 3 months.   Injury to surrounding organs when placing the coil.  HOW EFFECTIVE IS FEMALE STERILIZATION? Sterilization is nearly 100% effective, but it can fail. Depending on the type of sterilization, the rate of failure can be as high as 3%. After hysteroscopic sterilization with placement of fallopian tube coils, you will need back-up birth control for 3 months after the procedure. Sterilization is effective for a  lifetime.  BENEFITS OF STERILIZATION  It does not affect your hormones, and therefore will not affect your menstrual periods, sexual desire, or performance.   It is effective for a lifetime.   It is safe.   You do not need to worry about getting pregnant. Keep in mind that if you had the hysteroscopic placement procedure, you must wait 3 months after the procedure (or until your caregiver confirms) before pregnancy is not considered possible.   There are no side effects unlike other types of birth control (contraception).  DRAWBACKS OF STERILIZATION  You must be sure you do not want children or any more children. The procedure is permanent.   It does not provide protection against sexually transmitted infections (STIs).   The tubes can grow back together. If this happens, there is a risk of pregnancy. There is also an increased risk (50%) of pregnancy being an ectopic pregnancy. This is a pregnancy that happens outside of the uterus. Document Released: 02/10/2008 Document Revised: 02/23/2012 Document Reviewed: 12/10/2011 [facility] Patient Information 2014 [place], [place].

## 2014-01-09 ENCOUNTER — Ambulatory Visit (INDEPENDENT_AMBULATORY_CARE_PROVIDER_SITE_OTHER): Payer: 59 | Admitting: Obstetrics and Gynecology

## 2014-01-09 ENCOUNTER — Encounter: Payer: Self-pay | Admitting: Obstetrics and Gynecology

## 2014-01-09 VITALS — BP 114/73 | HR 81 | Temp 97.5°F | Wt 193.1 lb

## 2014-01-09 DIAGNOSIS — O09899 Supervision of other high risk pregnancies, unspecified trimester: Secondary | ICD-10-CM

## 2014-01-09 DIAGNOSIS — Z22330 Carrier of Group B streptococcus: Secondary | ICD-10-CM

## 2014-01-09 DIAGNOSIS — Z3480 Encounter for supervision of other normal pregnancy, unspecified trimester: Secondary | ICD-10-CM

## 2014-01-09 DIAGNOSIS — O99820 Streptococcus B carrier state complicating pregnancy: Secondary | ICD-10-CM

## 2014-01-09 LAB — POCT URINALYSIS DIP (DEVICE)
BILIRUBIN URINE: NEGATIVE
Glucose, UA: NEGATIVE mg/dL
HGB URINE DIPSTICK: NEGATIVE
Ketones, ur: NEGATIVE mg/dL
NITRITE: NEGATIVE
PH: 6.5 (ref 5.0–8.0)
Protein, ur: NEGATIVE mg/dL
SPECIFIC GRAVITY, URINE: 1.015 (ref 1.005–1.030)
Urobilinogen, UA: 1 mg/dL (ref 0.0–1.0)

## 2014-01-09 NOTE — Progress Notes (Signed)
C/o of pelvic pressure and irregular contractions.  Edema in ankles.

## 2014-01-09 NOTE — Patient Instructions (Signed)
Third Trimester of Pregnancy The third trimester is from week 29 through week 42, months 7 through 9. The third trimester is a time when the fetus is growing rapidly. At the end of the ninth month, the fetus is about 20 inches in length and weighs 6 10 pounds.  BODY CHANGES Your body goes through many changes during pregnancy. The changes vary from woman to woman.   Your weight will continue to increase. You can expect to gain 25 35 pounds (11 16 kg) by the end of the pregnancy.  You may begin to get stretch marks on your hips, abdomen, and breasts.  You may urinate more often because the fetus is moving lower into your pelvis and pressing on your bladder.  You may develop or continue to have heartburn as a result of your pregnancy.  You may develop constipation because certain hormones are causing the muscles that push waste through your intestines to slow down.  You may develop hemorrhoids or swollen, bulging veins (varicose veins).  You may have pelvic pain because of the weight gain and pregnancy hormones relaxing your joints between the bones in your pelvis. Back aches may result from over exertion of the muscles supporting your posture.  Your breasts will continue to grow and be tender. A yellow discharge may leak from your breasts called colostrum.  Your belly button may stick out.  You may feel short of breath because of your expanding uterus.  You may notice the fetus "dropping," or moving lower in your abdomen.  You may have a bloody mucus discharge. This usually occurs a few days to a week before labor begins.  Your cervix becomes thin and soft (effaced) near your due date. WHAT TO EXPECT AT YOUR PRENATAL EXAMS  You will have prenatal exams every 2 weeks until week 36. Then, you will have weekly prenatal exams. During a routine prenatal visit:  You will be weighed to make sure you and the fetus are growing normally.  Your blood pressure is taken.  Your abdomen will be  measured to track your baby's growth.  The fetal heartbeat will be listened to.  Any test results from the previous visit will be discussed.  You may have a cervical check near your due date to see if you have effaced. At around 36 weeks, your caregiver will check your cervix. At the same time, your caregiver will also perform a test on the secretions of the vaginal tissue. This test is to determine if a type of bacteria, Group B streptococcus, is present. Your caregiver will explain this further. Your caregiver may ask you:  What your birth plan is.  How you are feeling.  If you are feeling the baby move.  If you have had any abnormal symptoms, such as leaking fluid, bleeding, severe headaches, or abdominal cramping.  If you have any questions. Other tests or screenings that may be performed during your third trimester include:  Blood tests that check for low iron levels (anemia).  Fetal testing to check the health, activity level, and growth of the fetus. Testing is done if you have certain medical conditions or if there are problems during the pregnancy. FALSE LABOR You may feel small, irregular contractions that eventually go away. These are called Braxton Hicks contractions, or false labor. Contractions may last for hours, days, or even weeks before true labor sets in. If contractions come at regular intervals, intensify, or become painful, it is best to be seen by your caregiver.  SIGNS OF LABOR   Menstrual-like cramps.  Contractions that are 5 minutes apart or less.  Contractions that start on the top of the uterus and spread down to the lower abdomen and back.  A sense of increased pelvic pressure or back pain.  A watery or bloody mucus discharge that comes from the vagina. If you have any of these signs before the 37th week of pregnancy, call your caregiver right away. You need to go to the hospital to get checked immediately. HOME CARE INSTRUCTIONS   Avoid all  smoking, herbs, alcohol, and unprescribed drugs. These chemicals affect the formation and growth of the baby.  Follow your caregiver's instructions regarding medicine use. There are medicines that are either safe or unsafe to take during pregnancy.  Exercise only as directed by your caregiver. Experiencing uterine cramps is a good sign to stop exercising.  Continue to eat regular, healthy meals.  Wear a good support bra for breast tenderness.  Do not use hot tubs, steam rooms, or saunas.  Wear your seat belt at all times when driving.  Avoid raw meat, uncooked cheese, cat litter boxes, and soil used by cats. These carry germs that can cause birth defects in the baby.  Take your prenatal vitamins.  Try taking a stool softener (if your caregiver approves) if you develop constipation. Eat more high-fiber foods, such as fresh vegetables or fruit and whole grains. Drink plenty of fluids to keep your urine clear or pale yellow.  Take warm sitz baths to soothe any pain or discomfort caused by hemorrhoids. Use hemorrhoid cream if your caregiver approves.  If you develop varicose veins, wear support hose. Elevate your feet for 15 minutes, 3 4 times a day. Limit salt in your diet.  Avoid heavy lifting, wear low heal shoes, and practice good posture.  Rest a lot with your legs elevated if you have leg cramps or low back pain.  Visit your dentist if you have not gone during your pregnancy. Use a soft toothbrush to brush your teeth and be gentle when you floss.  A sexual relationship may be continued unless your caregiver directs you otherwise.  Do not travel far distances unless it is absolutely necessary and only with the approval of your caregiver.  Take prenatal classes to understand, practice, and ask questions about the labor and delivery.  Make a trial run to the hospital.  Pack your hospital bag.  Prepare the baby's nursery.  Continue to go to all your prenatal visits as directed  by your caregiver. SEEK MEDICAL CARE IF:  You are unsure if you are in labor or if your water has broken.  You have dizziness.  You have mild pelvic cramps, pelvic pressure, or nagging pain in your abdominal area.  You have persistent nausea, vomiting, or diarrhea.  You have a bad smelling vaginal discharge.  You have pain with urination. SEEK IMMEDIATE MEDICAL CARE IF:   You have a fever.  You are leaking fluid from your vagina.  You have spotting or bleeding from your vagina.  You have severe abdominal cramping or pain.  You have rapid weight loss or gain.  You have shortness of breath with chest pain.  You notice sudden or extreme swelling of your face, hands, ankles, feet, or legs.  You have not felt your baby move in over an hour.  You have severe headaches that do not go away with medicine.  You have vision changes. Document Released: 08/18/2001 Document Revised: 04/26/2013 Document Reviewed:   You have severe abdominal cramping or pain.   You have rapid weight loss or gain.   You have shortness of breath with chest pain.   You notice sudden or extreme swelling of your face, hands, ankles, feet, or legs.   You have not felt your baby move in over an hour.   You have severe headaches that do not go away with medicine.   You have vision changes.  Document Released: 08/18/2001 Document Revised: 04/26/2013 Document Reviewed: 10/25/2012  [employer] Patient Information 2014 [employer].

## 2014-01-10 DIAGNOSIS — O99820 Streptococcus B carrier state complicating pregnancy: Secondary | ICD-10-CM | POA: Insufficient documentation

## 2014-01-10 LAB — GC/CHLAMYDIA PROBE AMP
CT Probe RNA: NEGATIVE
GC Probe RNA: NEGATIVE

## 2014-01-11 ENCOUNTER — Encounter: Payer: 59 | Admitting: Obstetrics & Gynecology

## 2014-01-13 LAB — CULTURE, STREPTOCOCCUS GRP B W/SUSCEPT

## 2014-01-19 LAB — OB RESULTS CONSOLE GBS: GBS: POSITIVE

## 2014-01-23 ENCOUNTER — Encounter: Payer: Self-pay | Admitting: Advanced Practice Midwife

## 2014-01-23 ENCOUNTER — Encounter: Payer: 59 | Admitting: Advanced Practice Midwife

## 2014-02-01 ENCOUNTER — Ambulatory Visit (INDEPENDENT_AMBULATORY_CARE_PROVIDER_SITE_OTHER): Payer: 59 | Admitting: Family

## 2014-02-01 VITALS — BP 122/75 | HR 88 | Temp 97.2°F | Wt 196.1 lb

## 2014-02-01 DIAGNOSIS — Z3480 Encounter for supervision of other normal pregnancy, unspecified trimester: Secondary | ICD-10-CM

## 2014-02-01 LAB — POCT URINALYSIS DIP (DEVICE)
Bilirubin Urine: NEGATIVE
Glucose, UA: NEGATIVE mg/dL
Hgb urine dipstick: NEGATIVE
Ketones, ur: NEGATIVE mg/dL
Nitrite: NEGATIVE
PH: 6.5 (ref 5.0–8.0)
PROTEIN: NEGATIVE mg/dL
Specific Gravity, Urine: 1.02 (ref 1.005–1.030)
Urobilinogen, UA: 0.2 mg/dL (ref 0.0–1.0)

## 2014-02-01 NOTE — Progress Notes (Signed)
Patient reports a lot of pelvic pressure and pain as well as pain with contractions

## 2014-02-01 NOTE — Progress Notes (Signed)
Reports increase pressure and contractions.  Reviewed labor precautions.  Reviewed GBS status.

## 2014-02-06 ENCOUNTER — Encounter: Payer: Self-pay | Admitting: Advanced Practice Midwife

## 2014-02-06 ENCOUNTER — Inpatient Hospital Stay (HOSPITAL_COMMUNITY): Payer: 59 | Admitting: Anesthesiology

## 2014-02-06 ENCOUNTER — Ambulatory Visit (INDEPENDENT_AMBULATORY_CARE_PROVIDER_SITE_OTHER): Payer: 59 | Admitting: Advanced Practice Midwife

## 2014-02-06 ENCOUNTER — Encounter (HOSPITAL_COMMUNITY): Payer: Self-pay | Admitting: *Deleted

## 2014-02-06 ENCOUNTER — Inpatient Hospital Stay (HOSPITAL_COMMUNITY)
Admission: AD | Admit: 2014-02-06 | Discharge: 2014-02-08 | DRG: 767 | Disposition: A | Discharge status: Home / Self Care | Payer: 59 | Source: Ambulatory Visit | Attending: Obstetrics & Gynecology | Admitting: Obstetrics & Gynecology

## 2014-02-06 ENCOUNTER — Encounter (HOSPITAL_COMMUNITY): Payer: 59 | Admitting: Anesthesiology

## 2014-02-06 VITALS — BP 129/74 | HR 80 | Temp 97.1°F | Wt 197.0 lb

## 2014-02-06 DIAGNOSIS — IMO0001 Reserved for inherently not codable concepts without codable children: Secondary | ICD-10-CM

## 2014-02-06 DIAGNOSIS — Z22330 Carrier of Group B streptococcus: Secondary | ICD-10-CM | POA: Diagnosis not present

## 2014-02-06 DIAGNOSIS — Z302 Encounter for sterilization: Secondary | ICD-10-CM

## 2014-02-06 DIAGNOSIS — O9989 Other specified diseases and conditions complicating pregnancy, childbirth and the puerperium: Secondary | ICD-10-CM

## 2014-02-06 DIAGNOSIS — O09899 Supervision of other high risk pregnancies, unspecified trimester: Secondary | ICD-10-CM

## 2014-02-06 DIAGNOSIS — Z3480 Encounter for supervision of other normal pregnancy, unspecified trimester: Secondary | ICD-10-CM

## 2014-02-06 DIAGNOSIS — O0940 Supervision of pregnancy with grand multiparity, unspecified trimester: Secondary | ICD-10-CM

## 2014-02-06 DIAGNOSIS — O99892 Other specified diseases and conditions complicating childbirth: Secondary | ICD-10-CM | POA: Diagnosis present

## 2014-02-06 DIAGNOSIS — O9902 Anemia complicating childbirth: Secondary | ICD-10-CM | POA: Diagnosis present

## 2014-02-06 DIAGNOSIS — D649 Anemia, unspecified: Secondary | ICD-10-CM | POA: Diagnosis present

## 2014-02-06 DIAGNOSIS — O479 False labor, unspecified: Secondary | ICD-10-CM | POA: Diagnosis present

## 2014-02-06 DIAGNOSIS — O99820 Streptococcus B carrier state complicating pregnancy: Secondary | ICD-10-CM

## 2014-02-06 DIAGNOSIS — Z3490 Encounter for supervision of normal pregnancy, unspecified, unspecified trimester: Secondary | ICD-10-CM

## 2014-02-06 LAB — CBC
HEMATOCRIT: 30.8 % — AB (ref 36.0–46.0)
Hemoglobin: 10.2 g/dL — ABNORMAL LOW (ref 12.0–15.0)
MCH: 24.9 pg — AB (ref 26.0–34.0)
MCHC: 33.1 g/dL (ref 30.0–36.0)
MCV: 75.3 fL — AB (ref 78.0–100.0)
Platelets: 302 10*3/uL (ref 150–400)
RBC: 4.09 MIL/uL (ref 3.87–5.11)
RDW: 15.5 % (ref 11.5–15.5)
WBC: 16 10*3/uL — ABNORMAL HIGH (ref 4.0–10.5)

## 2014-02-06 LAB — POCT URINALYSIS DIP (DEVICE)
Bilirubin Urine: NEGATIVE
Glucose, UA: NEGATIVE mg/dL
HGB URINE DIPSTICK: NEGATIVE
Ketones, ur: NEGATIVE mg/dL
Nitrite: NEGATIVE
PH: 7 (ref 5.0–8.0)
PROTEIN: NEGATIVE mg/dL
SPECIFIC GRAVITY, URINE: 1.01 (ref 1.005–1.030)
UROBILINOGEN UA: 0.2 mg/dL (ref 0.0–1.0)

## 2014-02-06 MED ORDER — LACTATED RINGERS IV SOLN: 500.0000 mL | INTRAVENOUS | Status: DC | PRN

## 2014-02-06 MED ORDER — PHENYLEPHRINE 40 MCG/ML (10ML) SYRINGE FOR IV PUSH (FOR BLOOD PRESSURE SUPPORT)
80.0000 ug | PREFILLED_SYRINGE | INTRAVENOUS | Status: DC | PRN
  Filled 2014-02-06: qty 2
  Filled 2014-02-06: qty 10

## 2014-02-06 MED ORDER — FENTANYL 2.5 MCG/ML BUPIVACAINE 1/10 % EPIDURAL INFUSION (WH - ANES)
INTRAMUSCULAR | Status: DC | PRN
  Administered 2014-02-06: 14 mL/h via EPIDURAL

## 2014-02-06 MED ORDER — OXYTOCIN BOLUS FROM INFUSION: 500.0000 mL | INTRAVENOUS | Status: DC

## 2014-02-06 MED ORDER — CITRIC ACID-SODIUM CITRATE 334-500 MG/5ML PO SOLN: 30.0000 mL | ORAL | Status: DC | PRN

## 2014-02-06 MED ORDER — IBUPROFEN 600 MG PO TABS
600.0000 mg | ORAL_TABLET | Freq: Four times a day (QID) | ORAL | Status: DC | PRN
  Administered 2014-02-06: 600 mg via ORAL
  Filled 2014-02-06: qty 1

## 2014-02-06 MED ORDER — DIPHENHYDRAMINE HCL 50 MG/ML IJ SOLN: 12.5000 mg | INTRAMUSCULAR | Status: DC | PRN

## 2014-02-06 MED ORDER — LIDOCAINE HCL (PF) 1 % IJ SOLN
30.0000 mL | INTRAMUSCULAR | Status: DC | PRN
  Filled 2014-02-06: qty 30

## 2014-02-06 MED ORDER — PHENYLEPHRINE 40 MCG/ML (10ML) SYRINGE FOR IV PUSH (FOR BLOOD PRESSURE SUPPORT)
80.0000 ug | PREFILLED_SYRINGE | INTRAVENOUS | Status: DC | PRN
  Filled 2014-02-06: qty 2

## 2014-02-06 MED ORDER — EPHEDRINE 5 MG/ML INJ
10.0000 mg | INTRAVENOUS | Status: DC | PRN
  Filled 2014-02-06: qty 2

## 2014-02-06 MED ORDER — ONDANSETRON HCL 4 MG/2ML IJ SOLN: 4.0000 mg | Freq: Four times a day (QID) | INTRAMUSCULAR | Status: DC | PRN

## 2014-02-06 MED ORDER — LIDOCAINE HCL (PF) 1 % IJ SOLN
INTRAMUSCULAR | Status: DC | PRN
  Administered 2014-02-06 (×2): 4 mL

## 2014-02-06 MED ORDER — LACTATED RINGERS IV SOLN: INTRAVENOUS | Status: DC

## 2014-02-06 MED ORDER — PENICILLIN G POTASSIUM 5000000 UNITS IJ SOLR
5.0000 10*6.[IU] | Freq: Once | INTRAVENOUS | Status: AC
  Administered 2014-02-06: 5 10*6.[IU] via INTRAVENOUS
  Filled 2014-02-06: qty 5

## 2014-02-06 MED ORDER — PENICILLIN G POTASSIUM 5000000 UNITS IJ SOLR
2.5000 10*6.[IU] | INTRAVENOUS | Status: DC
  Filled 2014-02-06 (×6): qty 2.5

## 2014-02-06 MED ORDER — EPHEDRINE 5 MG/ML INJ
10.0000 mg | INTRAVENOUS | Status: DC | PRN
  Filled 2014-02-06: qty 4
  Filled 2014-02-06: qty 2

## 2014-02-06 MED ORDER — OXYCODONE-ACETAMINOPHEN 5-325 MG PO TABS: 1.0000 | ORAL_TABLET | ORAL | Status: DC | PRN

## 2014-02-06 MED ORDER — FLEET ENEMA 7-19 GM/118ML RE ENEM: 1.0000 | ENEMA | Freq: Every day | RECTAL | Status: DC | PRN

## 2014-02-06 MED ORDER — LACTATED RINGERS IV SOLN: 500.0000 mL | Freq: Once | INTRAVENOUS | Status: DC

## 2014-02-06 MED ORDER — OXYTOCIN 40 UNITS IN LACTATED RINGERS INFUSION - SIMPLE MED
62.5000 mL/h | INTRAVENOUS | Status: DC
  Administered 2014-02-06: 62.5 mL/h via INTRAVENOUS
  Filled 2014-02-06: qty 1000

## 2014-02-06 MED ORDER — ACETAMINOPHEN 325 MG PO TABS: 650.0000 mg | ORAL_TABLET | ORAL | Status: DC | PRN

## 2014-02-06 MED ORDER — FENTANYL 2.5 MCG/ML BUPIVACAINE 1/10 % EPIDURAL INFUSION (WH - ANES)
14.0000 mL/h | INTRAMUSCULAR | Status: DC | PRN
  Filled 2014-02-06: qty 125

## 2014-02-06 NOTE — MAU Note (Signed)
Ctx's every 5 min. No bleeding or leaking. Was 3+ today when checked. No problems with prg.

## 2014-02-06 NOTE — Patient Instructions (Signed)

## 2014-02-06 NOTE — H&P (Signed)
Sylvia Fitzpatrick is a 33 y.o. female presenting for contractions, becoming more regular and stronger over the past 2-3 days. Checked in clinic and found to be dilated to 5 cm.   Received prenatal care in the [facility] with onset of care at 12 weeks.  Pregnancy dated by 10 wk ultrasound.  No complications during pregnancy.    Maternal Medical History:  Reason for admission: Contractions.  Nausea.  Contractions: Onset was 2 days ago.   Frequency: regular.   Perceived severity is moderate.    Fetal activity: Perceived fetal activity is normal.   Last perceived fetal movement was within the past hour.    Prenatal Complications - Diabetes: none.    OB History   Grav Para Term Preterm Abortions TAB SAB Ect Mult Living   3 2 2  0 0 0 0 0 0 2     Past Medical History  Diagnosis Date  . No pertinent past medical history   . Headache(784.0)     Prior hx migraines  . PONV (postoperative nausea and vomiting)     n/v after iv anesthetic   Past Surgical History  Procedure Laterality Date  . Multiple tooth extractions     Family History: family history is not on file. Social History:  reports that she has never smoked. She has never used smokeless tobacco. She reports that she does not drink alcohol or use illicit drugs.  Review of Systems  Constitutional: Negative for fever and chills.  HENT: Negative for sore throat.        Mild headache, no vision change, no n/v  Eyes: Negative for blurred vision and pain.  Respiratory: Negative for cough and shortness of breath.   Cardiovascular: Negative for chest pain and palpitations.  Gastrointestinal: Positive for abdominal pain. Negative for heartburn, nausea, vomiting, diarrhea, constipation and blood in stool.       Contractions  Genitourinary: Negative for dysuria, hematuria and flank pain.  Musculoskeletal: Negative for myalgias.  Skin: Negative for rash.  Neurological: Positive for headaches. Negative for dizziness, loss of  consciousness and weakness.  Endo/Heme/Allergies: Does not bruise/bleed easily.    Dilation: 5 Effacement (%): 90 Station: -1 Exam by:: S Earl RNC Blood pressure 119/63, pulse 70, temperature 98 F (36.7 C), temperature source Oral, resp. rate 16, height 5\' 8"  (1.727 m), weight 89.359 kg (197 lb), last menstrual period 05/12/2013, SpO2 98.00%. Maternal Exam:  Uterine Assessment: Contraction strength is moderate.  Contraction frequency is irregular.   Abdomen: Patient reports no abdominal tenderness. Pelvis: adequate for delivery.      Fetal Exam Fetal Monitor Review: Baseline rate: 140s.  Variability: moderate (6-25 bpm).   Pattern: accelerations present.    Fetal State Assessment: Category I - tracings are normal.     Physical Exam  Constitutional: She is oriented to person, place, and time. She appears well-developed and well-nourished.  HENT:  Head: Normocephalic and atraumatic.  Eyes: Conjunctivae and EOM are normal.  Neck: Neck supple.  Cardiovascular: Normal rate, regular rhythm and normal heart sounds.   No murmur heard. Respiratory: Effort normal and breath sounds normal. No respiratory distress.  GI: Soft.  Gravid  Musculoskeletal: Normal range of motion.  Neurological: She is alert and oriented to person, place, and time.  Skin: Skin is warm and dry.  Psychiatric: She has a normal mood and affect.    Prenatal labs: ABO, Rh: O/POS/-- (11/18 1032) Antibody: NEG (11/18 1032) Rubella: 1.06 (11/18 1032) RPR: NON REAC (02/24 1656)  HBsAg: NEGATIVE (11/18 1032)  HIV: NON REACTIVE (02/24 1656)  GBS: Positive (05/15 0000)   Assessment/Plan: 33yo G3P2002 at [redacted]w[redacted]d admitted for active labor, low risk  GBS pos - PCN started Epidural in place Inadequate Ctx, Consider AROM, consider pitocin, will monitor   Joelyn Oms 02/06/2014, 6:22 PM

## 2014-02-06 NOTE — Anesthesia Preprocedure Evaluation (Signed)
Anesthesia Evaluation  Patient identified by MRN, date of birth, ID band Patient awake    Reviewed: Allergy & Precautions, H&P , Patient's Chart, lab work & pertinent test results  History of Anesthesia Complications (+) PONV and history of anesthetic complications  Airway Mallampati: II TM Distance: >3 FB Neck ROM: Full    Dental no notable dental hx. (+) Teeth Intact   Pulmonary neg pulmonary ROS,  breath sounds clear to auscultation  Pulmonary exam normal       Cardiovascular negative cardio ROS  Rhythm:Regular Rate:Normal     Neuro/Psych  Headaches, negative psych ROS   GI/Hepatic negative GI ROS, Neg liver ROS,   Endo/Other  negative endocrine ROS  Renal/GU negative Renal ROS  negative genitourinary   Musculoskeletal negative musculoskeletal ROS (+)   Abdominal   Peds  Hematology  (+) anemia ,   Anesthesia Other Findings   Reproductive/Obstetrics (+) Pregnancy                           Anesthesia Physical Anesthesia Plan  ASA: II  Anesthesia Plan: Epidural   Post-op Pain Management:    Induction:   Airway Management Planned: Natural Airway  Additional Equipment:   Intra-op Plan:   Post-operative Plan:   Informed Consent: I have reviewed the patients History and Physical, chart, labs and discussed the procedure including the risks, benefits and alternatives for the proposed anesthesia with the patient or authorized representative who has indicated his/her understanding and acceptance.     Plan Discussed with: Anesthesiologist  Anesthesia Plan Comments:         Anesthesia Quick Evaluation

## 2014-02-06 NOTE — Progress Notes (Signed)
Reports abdominal/pelvic pain/pressure and irregular, but getting stronger, contractions.

## 2014-02-06 NOTE — Anesthesia Procedure Notes (Signed)
Epidural Patient location during procedure: OB Start time: 02/06/2014 5:13 PM  Staffing Anesthesiologist: Nazifa Trinka A. Performed by: anesthesiologist   Preanesthetic Checklist Completed: patient identified, site marked, surgical consent, pre-op evaluation, timeout performed, IV checked, risks and benefits discussed and monitors and equipment checked  Epidural Patient position: sitting Prep: site prepped and draped and DuraPrep Patient monitoring: continuous pulse ox and blood pressure Approach: midline Location: L3-L4 Injection technique: LOR air  Needle:  Needle type: Tuohy  Needle gauge: 17 G Needle length: 9 cm and 9 Needle insertion depth: 5 cm cm Catheter type: closed end flexible Catheter size: 19 Gauge Catheter at skin depth: 10 cm Test dose: negative and Other  Assessment Events: blood not aspirated, injection not painful, no injection resistance, negative IV test and no paresthesia  Additional Notes Patient identified. Risks and benefits discussed including failed block, incomplete  Pain control, post dural puncture headache, nerve damage, paralysis, blood pressure Changes, nausea, vomiting, reactions to medications-both toxic and allergic and post Partum back pain. All questions were answered. Patient expressed understanding and wished to proceed. Sterile technique was used throughout procedure. Epidural site was Dressed with sterile barrier dressing. No paresthesias, signs of intravascular injection Or signs of intrathecal spread were encountered.  Patient was more comfortable after the epidural was dosed. Please see RN's note for documentation of vital signs and FHR which are stable.

## 2014-02-06 NOTE — Progress Notes (Signed)
   Subjective: Comfortable with epidural. No complaints. Desires AROM.   Objective: BP 115/65  Pulse 66  Temp(Src) 97.7 F (36.5 C) (Axillary)  Resp 18  Ht 5\' 8"  (1.727 m)  Wt 89.359 kg (197 lb)  BMI 29.96 kg/m2  SpO2 98%  LMP 05/12/2013      FHT:  FHR: 120s bpm, variability: moderate,  accelerations:  Present,  decelerations:  Present early UC:   irregular, every 5-10 minutes SVE:   Dilation: 5.5 Effacement (%): 70 Station: -1 Exam by:: W. Muhammad  Labs: Lab Results  Component Value Date   WBC 16.0* 02/06/2014   HGB 10.2* 02/06/2014   HCT 30.8* 02/06/2014   MCV 75.3* 02/06/2014   PLT 302 02/06/2014    Assessment / Plan: Spontaneous labor, progressing normally  Labor: Progressing normally Preeclampsia:  n/a Fetal Wellbeing:  Category I Pain Control:  Epidural I/D:  GBS + on PCN Anticipated MOD:  NSVD  AROM at 1940 - clear  Joelyn Oms 02/06/2014, 7:48 PM

## 2014-02-06 NOTE — Progress Notes (Signed)
Membranes swept. Plan NST Friday if not delivered.

## 2014-02-07 ENCOUNTER — Encounter (HOSPITAL_COMMUNITY): Payer: 59 | Admitting: Certified Registered"

## 2014-02-07 ENCOUNTER — Encounter (HOSPITAL_COMMUNITY)
Admission: AD | Disposition: A | Discharge status: Home / Self Care | Payer: Self-pay | Source: Ambulatory Visit | Attending: Obstetrics & Gynecology

## 2014-02-07 ENCOUNTER — Inpatient Hospital Stay (HOSPITAL_COMMUNITY): Payer: 59 | Admitting: Certified Registered"

## 2014-02-07 DIAGNOSIS — O479 False labor, unspecified: Secondary | ICD-10-CM | POA: Diagnosis not present

## 2014-02-07 DIAGNOSIS — Z302 Encounter for sterilization: Secondary | ICD-10-CM

## 2014-02-07 HISTORY — PX: TUBAL LIGATION: SHX77

## 2014-02-07 LAB — CBC
HEMATOCRIT: 26.4 % — AB (ref 36.0–46.0)
HEMOGLOBIN: 8.5 g/dL — AB (ref 12.0–15.0)
MCH: 24.2 pg — ABNORMAL LOW (ref 26.0–34.0)
MCHC: 32.2 g/dL (ref 30.0–36.0)
MCV: 75.2 fL — ABNORMAL LOW (ref 78.0–100.0)
Platelets: 258 10*3/uL (ref 150–400)
RBC: 3.51 MIL/uL — ABNORMAL LOW (ref 3.87–5.11)
RDW: 15.5 % (ref 11.5–15.5)
WBC: 16.5 10*3/uL — ABNORMAL HIGH (ref 4.0–10.5)

## 2014-02-07 LAB — MRSA PCR SCREENING: MRSA by PCR: NEGATIVE

## 2014-02-07 LAB — RPR

## 2014-02-07 SURGERY — LIGATION, FALLOPIAN TUBE, POSTPARTUM
Anesthesia: Epidural | Site: Abdomen | Laterality: Bilateral

## 2014-02-07 MED ORDER — IBUPROFEN 600 MG PO TABS
600.0000 mg | ORAL_TABLET | Freq: Four times a day (QID) | ORAL | Status: DC
  Administered 2014-02-07 – 2014-02-08 (×5): 600 mg via ORAL
  Filled 2014-02-07 (×5): qty 1

## 2014-02-07 MED ORDER — METHYLERGONOVINE MALEATE 0.2 MG PO TABS: 0.2000 mg | ORAL_TABLET | ORAL | Status: DC | PRN

## 2014-02-07 MED ORDER — MAGNESIUM HYDROXIDE 400 MG/5ML PO SUSP: 30.0000 mL | ORAL | Status: DC | PRN

## 2014-02-07 MED ORDER — FENTANYL CITRATE 0.05 MG/ML IJ SOLN
INTRAMUSCULAR | Status: AC
  Filled 2014-02-07: qty 2

## 2014-02-07 MED ORDER — METOCLOPRAMIDE HCL 10 MG PO TABS
10.0000 mg | ORAL_TABLET | Freq: Once | ORAL | Status: AC
Start: 2014-02-07 — End: 2014-02-07
  Administered 2014-02-07: 10 mg via ORAL

## 2014-02-07 MED ORDER — PROMETHAZINE HCL 25 MG/ML IJ SOLN: 6.2500 mg | INTRAMUSCULAR | Status: DC | PRN

## 2014-02-07 MED ORDER — OXYCODONE-ACETAMINOPHEN 5-325 MG PO TABS
1.0000 | ORAL_TABLET | ORAL | Status: DC | PRN
  Administered 2014-02-07: 1 via ORAL
  Administered 2014-02-07: 2 via ORAL
  Administered 2014-02-07: 1 via ORAL
  Filled 2014-02-07: qty 1
  Filled 2014-02-07: qty 2
  Filled 2014-02-07: qty 1

## 2014-02-07 MED ORDER — BENZOCAINE-MENTHOL 20-0.5 % EX AERO: 1.0000 "application " | INHALATION_SPRAY | CUTANEOUS | Status: DC | PRN

## 2014-02-07 MED ORDER — ONDANSETRON HCL 4 MG/2ML IJ SOLN
INTRAMUSCULAR | Status: DC | PRN
  Administered 2014-02-07: 4 mg via INTRAVENOUS

## 2014-02-07 MED ORDER — SIMETHICONE 80 MG PO CHEW
80.0000 mg | CHEWABLE_TABLET | ORAL | Status: DC | PRN
  Administered 2014-02-07 (×2): 80 mg via ORAL
  Filled 2014-02-07 (×2): qty 1

## 2014-02-07 MED ORDER — ONDANSETRON HCL 4 MG/2ML IJ SOLN: 4.0000 mg | Freq: Once | INTRAMUSCULAR | Status: DC | PRN

## 2014-02-07 MED ORDER — BUPIVACAINE HCL (PF) 0.25 % IJ SOLN
INTRAMUSCULAR | Status: DC | PRN
  Administered 2014-02-07: 9 mL

## 2014-02-07 MED ORDER — LACTATED RINGERS IV SOLN
INTRAVENOUS | Status: DC
  Administered 2014-02-07: 09:00:00 via INTRAVENOUS

## 2014-02-07 MED ORDER — LIDOCAINE-EPINEPHRINE (PF) 2 %-1:200000 IJ SOLN
INTRAMUSCULAR | Status: AC
  Filled 2014-02-07: qty 20

## 2014-02-07 MED ORDER — ZOLPIDEM TARTRATE 5 MG PO TABS: 5.0000 mg | ORAL_TABLET | Freq: Every evening | ORAL | Status: DC | PRN

## 2014-02-07 MED ORDER — ONDANSETRON HCL 4 MG/2ML IJ SOLN
INTRAMUSCULAR | Status: AC
  Filled 2014-02-07: qty 2

## 2014-02-07 MED ORDER — SODIUM BICARBONATE 8.4 % IV SOLN
INTRAVENOUS | Status: AC
  Filled 2014-02-07: qty 50

## 2014-02-07 MED ORDER — MIDAZOLAM HCL 2 MG/2ML IJ SOLN
INTRAMUSCULAR | Status: AC
  Filled 2014-02-07: qty 2

## 2014-02-07 MED ORDER — MEASLES, MUMPS & RUBELLA VAC ~~LOC~~ INJ
0.5000 mL | INJECTION | Freq: Once | SUBCUTANEOUS | Status: DC
  Filled 2014-02-07: qty 0.5

## 2014-02-07 MED ORDER — FAMOTIDINE 20 MG PO TABS
40.0000 mg | ORAL_TABLET | Freq: Once | ORAL | Status: AC
  Administered 2014-02-07: 40 mg via ORAL

## 2014-02-07 MED ORDER — MEPERIDINE HCL 25 MG/ML IJ SOLN: 6.2500 mg | INTRAMUSCULAR | Status: DC | PRN

## 2014-02-07 MED ORDER — TETANUS-DIPHTH-ACELL PERTUSSIS 5-2.5-18.5 LF-MCG/0.5 IM SUSP: 0.5000 mL | Freq: Once | INTRAMUSCULAR | Status: DC

## 2014-02-07 MED ORDER — DIPHENHYDRAMINE HCL 25 MG PO CAPS: 25.0000 mg | ORAL_CAPSULE | Freq: Four times a day (QID) | ORAL | Status: DC | PRN

## 2014-02-07 MED ORDER — DIBUCAINE 1 % RE OINT: 1.0000 "application " | TOPICAL_OINTMENT | RECTAL | Status: DC | PRN

## 2014-02-07 MED ORDER — FENTANYL CITRATE 0.05 MG/ML IJ SOLN
INTRAMUSCULAR | Status: DC | PRN
  Administered 2014-02-07 (×2): 50 ug via INTRAVENOUS

## 2014-02-07 MED ORDER — HYDROMORPHONE HCL PF 1 MG/ML IJ SOLN: 0.2500 mg | INTRAMUSCULAR | Status: DC | PRN

## 2014-02-07 MED ORDER — LANOLIN HYDROUS EX OINT: 1.0000 "application " | TOPICAL_OINTMENT | CUTANEOUS | Status: DC | PRN

## 2014-02-07 MED ORDER — FENTANYL CITRATE 0.05 MG/ML IJ SOLN: 25.0000 ug | INTRAMUSCULAR | Status: DC | PRN

## 2014-02-07 MED ORDER — LACTATED RINGERS IV SOLN
INTRAVENOUS | Status: DC
  Administered 2014-02-07: 20 mL/h via INTRAVENOUS

## 2014-02-07 MED ORDER — ONDANSETRON HCL 4 MG PO TABS: 4.0000 mg | ORAL_TABLET | ORAL | Status: DC | PRN

## 2014-02-07 MED ORDER — MIDAZOLAM HCL 2 MG/2ML IJ SOLN
INTRAMUSCULAR | Status: DC | PRN
  Administered 2014-02-07: 2 mg via INTRAVENOUS

## 2014-02-07 MED ORDER — KETOROLAC TROMETHAMINE 30 MG/ML IJ SOLN: 15.0000 mg | Freq: Once | INTRAMUSCULAR | Status: DC | PRN

## 2014-02-07 MED ORDER — WITCH HAZEL-GLYCERIN EX PADS: 1.0000 "application " | MEDICATED_PAD | CUTANEOUS | Status: DC | PRN

## 2014-02-07 MED ORDER — FAMOTIDINE 20 MG PO TABS
40.0000 mg | ORAL_TABLET | Freq: Once | ORAL | Status: DC
  Filled 2014-02-07: qty 2

## 2014-02-07 MED ORDER — FERROUS SULFATE 325 (65 FE) MG PO TABS
325.0000 mg | ORAL_TABLET | Freq: Two times a day (BID) | ORAL | Status: DC
  Administered 2014-02-07: 325 mg via ORAL
  Filled 2014-02-07: qty 1

## 2014-02-07 MED ORDER — SENNOSIDES-DOCUSATE SODIUM 8.6-50 MG PO TABS
2.0000 | ORAL_TABLET | ORAL | Status: DC
  Administered 2014-02-07: 2 via ORAL
  Filled 2014-02-07: qty 2

## 2014-02-07 MED ORDER — METHYLERGONOVINE MALEATE 0.2 MG/ML IJ SOLN: 0.2000 mg | INTRAMUSCULAR | Status: DC | PRN

## 2014-02-07 MED ORDER — SODIUM BICARBONATE 8.4 % IV SOLN
INTRAVENOUS | Status: DC | PRN
  Administered 2014-02-07: 3 mL via EPIDURAL
  Administered 2014-02-07 (×3): 5 mL via EPIDURAL
  Administered 2014-02-07: 2 mL via EPIDURAL
  Administered 2014-02-07: 5 mL via EPIDURAL

## 2014-02-07 MED ORDER — PRENATAL MULTIVITAMIN CH
1.0000 | ORAL_TABLET | Freq: Every day | ORAL | Status: DC
  Administered 2014-02-07: 1 via ORAL
  Filled 2014-02-07: qty 1

## 2014-02-07 MED ORDER — METOCLOPRAMIDE HCL 10 MG PO TABS
10.0000 mg | ORAL_TABLET | Freq: Once | ORAL | Status: DC
  Filled 2014-02-07: qty 1

## 2014-02-07 MED ORDER — ONDANSETRON HCL 4 MG/2ML IJ SOLN: 4.0000 mg | INTRAMUSCULAR | Status: DC | PRN

## 2014-02-07 SURGICAL SUPPLY — 20 items
CHLORAPREP W/TINT 26ML (MISCELLANEOUS) ×2 IMPLANT
CLIP FILSHIE TUBAL LIGA STRL (Clip) ×2 IMPLANT
CLOTH BEACON ORANGE TIMEOUT ST (SAFETY) ×2 IMPLANT
DRSG OPSITE POSTOP 3X4 (GAUZE/BANDAGES/DRESSINGS) ×2 IMPLANT
DRSG OPSITE POSTOP 4X10 (GAUZE/BANDAGES/DRESSINGS) ×1 IMPLANT
GLOVE BIOGEL PI IND STRL 7.0 (GLOVE) ×1 IMPLANT
GLOVE BIOGEL PI INDICATOR 7.0 (GLOVE) ×1
GLOVE ECLIPSE 7.0 STRL STRAW (GLOVE) ×4 IMPLANT
GOWN STRL REUS W/TWL LRG LVL3 (GOWN DISPOSABLE) ×4 IMPLANT
NEEDLE HYPO 22GX1.5 SAFETY (NEEDLE) ×2 IMPLANT
NS IRRIG 1000ML POUR BTL (IV SOLUTION) ×2 IMPLANT
PACK ABDOMINAL MINOR (CUSTOM PROCEDURE TRAY) ×2 IMPLANT
SPONGE LAP 4X18 X RAY DECT (DISPOSABLE) ×1 IMPLANT
SUT VIC AB 0 CT1 27 (SUTURE) ×2
SUT VIC AB 0 CT1 27XBRD ANBCTR (SUTURE) ×1 IMPLANT
SUT VICRYL 4-0 PS2 18IN ABS (SUTURE) ×2 IMPLANT
SYR CONTROL 10ML LL (SYRINGE) ×2 IMPLANT
TOWEL OR 17X24 6PK STRL BLUE (TOWEL DISPOSABLE) ×4 IMPLANT
TRAY FOLEY CATH 14FR (SET/KITS/TRAYS/PACK) ×2 IMPLANT
WATER STERILE IRR 1000ML POUR (IV SOLUTION) ×2 IMPLANT

## 2014-02-07 NOTE — Progress Notes (Signed)
Post Partum Day 1 Subjective: no complaints, up ad lib and tolerating PO  Objective: Blood pressure 98/60, pulse 73, temperature 97.8 F (36.6 C), temperature source Oral, resp. rate 18, height 5\' 8"  (1.727 m), weight 89.359 kg (197 lb), last menstrual period 05/12/2013, SpO2 98.00%, unknown if currently breastfeeding.  Physical Exam:  General: alert, cooperative and no distress Lochia: appropriate Uterine Fundus: firm Incision:   DVT Evaluation: No evidence of DVT seen on physical exam.   Recent Labs  02/06/14 1637 02/07/14 0602  HGB 10.2* 8.5*  HCT 30.8* 26.4*    Assessment/Plan: Contraception plans PP BTL.The procedure and the risk of anesthesia, bleeding, infection, bowel and bladder injury, failure (1/200) and ectopic pregnancy were discussed and her questions were answered.  LOS: 1 day   Woodroe Mode 02/07/2014, 7:51 AM

## 2014-02-07 NOTE — Transfer of Care (Signed)
Immediate Anesthesia Transfer of Care Note  Patient: Sylvia Fitzpatrick  Procedure(s) Performed: Procedure(s): POST PARTUM TUBAL LIGATION (Bilateral)  Patient Location: PACU  Anesthesia Type:Epidural  Level of Consciousness: awake, alert  and oriented  Airway & Oxygen Therapy: Patient Spontanous Breathing  Post-op Assessment: Report given to PACU RN  Post vital signs: Reviewed and stable  Complications: No apparent anesthesia complications

## 2014-02-07 NOTE — Anesthesia Postprocedure Evaluation (Signed)
Anesthesia Post Note  Patient: Sylvia Fitzpatrick  Procedure(s) Performed: Procedure(s) (LRB): POST PARTUM TUBAL LIGATION (Bilateral)  Anesthesia type: Epidural  Patient location: PACU  Post pain: Pain level controlled  Post assessment: Post-op Vital signs reviewed  Last Vitals:  Filed Vitals:   02/07/14 1046  BP: 103/66  Pulse: 68  Temp: 36.3 C  Resp: 16    Post vital signs: Reviewed  Level of consciousness: awake  Complications: No apparent anesthesia complications

## 2014-02-07 NOTE — Anesthesia Preprocedure Evaluation (Signed)
Anesthesia Evaluation  Patient identified by MRN, date of birth, ID band Patient awake    Reviewed: Allergy & Precautions, H&P , Patient's Chart, lab work & pertinent test results  History of Anesthesia Complications (+) PONV and history of anesthetic complications  Airway Mallampati: II TM Distance: >3 FB Neck ROM: Full    Dental no notable dental hx. (+) Teeth Intact   Pulmonary neg pulmonary ROS,  breath sounds clear to auscultation  Pulmonary exam normal       Cardiovascular negative cardio ROS  Rhythm:Regular Rate:Normal     Neuro/Psych  Headaches, negative psych ROS   GI/Hepatic negative GI ROS, Neg liver ROS,   Endo/Other  negative endocrine ROS  Renal/GU negative Renal ROS  negative genitourinary   Musculoskeletal negative musculoskeletal ROS (+)   Abdominal   Peds  Hematology  (+) anemia ,   Anesthesia Other Findings   Reproductive/Obstetrics (+) Pregnancy                           Anesthesia Physical  Anesthesia Plan  ASA: II  Anesthesia Plan: Epidural   Post-op Pain Management:    Induction:   Airway Management Planned: Natural Airway  Additional Equipment:   Intra-op Plan:   Post-operative Plan:   Informed Consent: I have reviewed the patients History and Physical, chart, labs and discussed the procedure including the risks, benefits and alternatives for the proposed anesthesia with the patient or authorized representative who has indicated his/her understanding and acceptance.     Plan Discussed with: Anesthesiologist  Anesthesia Plan Comments: (For PPTL with epidural in-situ)        Anesthesia Quick Evaluation

## 2014-02-07 NOTE — Interval H&P Note (Signed)
History and Physical Interval Note:  0/05/6282 6:62 AM  Sylvia Fitzpatrick  has presented today for surgery, now ppd #1 s/p SVD, with the diagnosis of Desires Sterilization  The various methods of treatment have been discussed with the patient and family. After consideration of risks, benefits and other options for treatment, the patient has consented to  Procedure(s): POST PARTUM TUBAL LIGATION (Bilateral) as a surgical intervention .  The patient's history has been reviewed, patient examined, no change in status, stable for surgery.  I have reviewed the patient's chart and labs.  Questions were answered to the patient's satisfaction.  Patient counseled, r.e. Risks benefits of BTL, including permanency of procedure, risk of failure (1:100), increased risk of ectopic.  Patient verbalized understanding and desires to proceed    Donnamae Jude

## 2014-02-07 NOTE — Anesthesia Postprocedure Evaluation (Signed)
  Anesthesia Post-op Note  Anesthesia Post Note  Patient: Sylvia Fitzpatrick  Procedure(s) Performed: * No procedures listed *  Anesthesia type: Epidural  Patient location: Mother/Baby  Post pain: Pain level controlled  Post assessment: Post-op Vital signs reviewed  Last Vitals:  Filed Vitals:   02/07/14 0449  BP: 98/60  Pulse: 73  Temp: 36.6 C  Resp: 18    Post vital signs: Reviewed  Level of consciousness:alert  Complications: No apparent anesthesia complications

## 2014-02-07 NOTE — Lactation Note (Signed)
This note was copied from the chart of [place]. Lactation Consultation Note  Patient Name: Sylvia Fitzpatrick [id number] Date: 02/07/2014 Reason for consult: Initial assessment (mom plans to call when she will be pumping for a flange check ) Per mom plans to pump and bottle feed . Presently just waking up from a nap . LC encouraged to page  When she is ready to pump again so flange can be checked. Mother informed of post-discharge support and given phone number to the lactation department, including services for phone call assistance; out-patient appointments; and breastfeeding support group. List of other breastfeeding resources in the community given in the handout. Encouraged mother to call for problems or concerns related to breastfeeding.   Maternal Data Formula Feeding for Exclusion: Yes Reason for exclusion: Mother's choice to formula and breast feed on admission Infant to breast within first hour of birth: No Breastfeeding delayed due to:: Maternal status (mom plans to pump and bottle only )  Feeding Feeding Type: Bottle Fed - Formula  LATCH Score/Interventions                      Lactation Tools Discussed/Used     Consult Status Consult Status: Follow-up Date: 02/07/14 Follow-up type: In-patient    [place] 02/07/2014, 12:30 PM

## 2014-02-07 NOTE — Op Note (Signed)
Sylvia Fitzpatrick  [birth year] - 02/07/2014  PREOPERATIVE DIAGNOSIS:  Multiparity, undesired fertility  POSTOPERATIVE DIAGNOSIS:  Multiparity, undesired fertility  PROCEDURE:  Postpartum Bilateral Tubal Sterilization using Filshie Clips   ANESTHESIA:  Epidural and local analgesia using 0.25% Marcaine  ASSISTANT: Ebbie Latus, MD  COMPLICATIONS:  None immediate.  ESTIMATED BLOOD LOSS: 5 ml.  INDICATIONS: 33 y.o. [id number]  with undesired fertility,status post vaginal delivery, desires permanent sterilization.  Other reversible forms of contraception were discussed with patient; she declines all other modalities. Risks of procedure discussed with patient including but not limited to: risk of regret, permanence of method, bleeding, infection, injury to surrounding organs and need for additional procedures.  Failure risk of 0.5-1% with increased risk of ectopic gestation if pregnancy occurs was also discussed with patient.     FINDINGS:  Normal uterus, tubes, and ovaries.  PROCEDURE DETAILS: The patient was taken to the operating room where her spinal anesthesia was dosed up to surgical level and found to be adequate.  She was then placed in a supine position and prepped and draped in the usual sterile fashion.  After an adequate timeout was performed, attention was turned to the patient's abdomen where a small transverse skin incision was made under the umbilical fold. The incision was taken down to the layer of fascia using the scalpel, and fascia was incised, and extended bilaterally. The peritoneum was entered in a sharp fashion. The patient was placed in Trendelenburg. The left fallopian tube was identified and grasped with a Babcock clamp, and followed out to the fimbriated end.  A Filshie clip was placed on the left fallopian tube about 2 cm from the cornu.  A similar process was carried out on the right side allowing for bilateral tubal sterilization.  Good hemostasis was noted overall.  Local  analgesia was drizzled onto both Filshie application sites.The instruments were then removed from the patient's abdomen and the fascial incision was repaired with 0 Vicryl, and the skin was closed with a 4-0 Vicryl subcuticular stitch. The patient tolerated the procedure well.  Sponge, lap, and needle counts were correct times two.  The patient was then taken to the recovery room awake, extubated and in stable condition.  Donnamae Jude MD 02/07/2014 9:43 AM

## 2014-02-08 ENCOUNTER — Encounter (HOSPITAL_COMMUNITY): Payer: Self-pay | Admitting: Family Medicine

## 2014-02-08 ENCOUNTER — Ambulatory Visit: Payer: Self-pay

## 2014-02-08 MED ORDER — FERROUS SULFATE 325 (65 FE) MG PO TABS: 325.0000 mg | ORAL_TABLET | Freq: Two times a day (BID) | ORAL | Status: DC

## 2014-02-08 MED ORDER — IBUPROFEN 600 MG PO TABS: 600.0000 mg | ORAL_TABLET | Freq: Four times a day (QID) | ORAL | Status: DC

## 2014-02-08 NOTE — Discharge Instructions (Signed)

## 2014-02-08 NOTE — Anesthesia Postprocedure Evaluation (Deleted)
  Anesthesia Post-op Note  Patient: Sylvia Fitzpatrick  Procedure(s) Performed: Procedure(s): POST PARTUM TUBAL LIGATION (Bilateral)  Patient Location: Mother/Baby  Anesthesia Type:Epidural  Level of Consciousness: awake, alert , oriented and patient cooperative  Airway and Oxygen Therapy: Patient Spontanous Breathing  Post-op Pain: mild  Post-op Assessment: Patient's Cardiovascular Status Stable, Respiratory Function Stable, No headache, No backache, No residual numbness and No residual motor weakness  Post-op Vital Signs: stable  Last Vitals:  Filed Vitals:   02/08/14 0555  BP: 119/72  Pulse: 78  Temp: 36.4 C  Resp: 20    Complications: No apparent anesthesia complications 

## 2014-02-08 NOTE — Anesthesia Postprocedure Evaluation (Deleted)
  Anesthesia Post-op Note  Patient: Sylvia Fitzpatrick  Procedure(s) Performed: Procedure(s): POST PARTUM TUBAL LIGATION (Bilateral)  Patient Location: Mother/Baby  Anesthesia Type:Epidural  Level of Consciousness: awake, alert , oriented and patient cooperative  Airway and Oxygen Therapy: Patient Spontanous Breathing  Post-op Pain: mild  Post-op Assessment: Patient's Cardiovascular Status Stable, Respiratory Function Stable, No headache, No backache, No residual numbness and No residual motor weakness  Post-op Vital Signs: stable  Last Vitals:  Filed Vitals:   02/08/14 0555  BP: 119/72  Pulse: 78  Temp: 36.4 C  Resp: 20    Complications: No apparent anesthesia complications

## 2014-02-08 NOTE — Addendum Note (Signed)
Addendum created 02/08/14 1045 by Georgeanne Nim, CRNA   Modules edited: Charges VN

## 2014-02-08 NOTE — Discharge Summary (Signed)
Attestation of Attending Supervision of Advanced Practitioner (CNM/NP): Evaluation and management procedures were performed by the Advanced Practitioner under my supervision and collaboration.  I have reviewed the Advanced Practitioner's note and chart, and I agree with the management and plan.  Lezlie Ritchey 02/08/2014 8:37 PM

## 2014-02-08 NOTE — Lactation Note (Signed)
This note was copied from the chart of [facility] [facility]. Lactation Consultation Note  Patient Name: Sylvia Fitzpatrick [id number] Date: 02/08/2014 Reason for consult: Follow-up assessment Per mom has pumped x2 last 24 hours with no results. Per mom plan to work on latching at home .LC offered  To assess latch at the breast and per mom wanted to wait until she went home .Per mom had breast fed one of her other baby's for 1 week and not the other baby. LC recommended trying waking the baby up at 2 1/2 hours before the baby hungry and try latching , and if he won't latch, give him what he has got'en used to , appetizer from a bottle 5-10 ml and then try to latch. Instructed mom on use of hand pump. LC suggested to mom to call insurance regarding a DEBP .Mother informed of post-discharge support and given phone number to the lactation department, including services for phone call assistance; out-patient appointments; and breastfeeding support group. List of other breastfeeding resources in the community given in the handout. Encouraged mother to call for problems or concerns related to breastfeeding.   Maternal Data Has patient been taught Hand Expression?:  (mom declined at present )  Feeding Feeding Type:  (mom declined trying to latch baby )  LATCH Score/Interventions                      Lactation Tools Discussed/Used Tools: Pump Breast pump type: Manual (also has a DEBP kit set , was pumping ) Pump Review: Setup, frequency, and cleaning (pump was already set up , reviewed with mom , also manual pump ) Initiated by:: MAI  Date initiated:: 02/08/14   Consult Status      Sylvia Fitzpatrick 02/08/2014, 10:43 AM

## 2014-02-08 NOTE — Discharge Summary (Signed)
Obstetric Discharge Summary Reason for Admission: onset of labor Prenatal Procedures: none Intrapartum Procedures: spontaneous vaginal delivery and tubal ligation Postpartum Procedures: none Complications-Operative and Postpartum: none  Hospital Course: 33 y.o. F presented in active labor that had been worsening over last several days. Pt progressed to delivery over 3 hrs. Note below. Pt had a PP BTL for contraception. Pt is breast and bottle feeding. No acute issues PP and meeting all milestones. No other complaints at this time. Pt has a circumcision scheduled for this afternoon.    Delivery Note At 9:37 PM a viable and healthy female was delivered via Vaginal, Spontaneous Delivery (Presentation: ; Occiput Anterior).  APGAR: 10, 10; weight 6 lb 6 oz (2892 g).   Placenta status: Intact, Spontaneous.  Cord: 3 vessels with the following complications: None.  Cord pH: NA  Anesthesia: Epidural  Episiotomy: None Lacerations: Vaginal;1st degree Suture Repair: 3.0 vicryl rapide Est. Blood Loss (mL): 250  Mom to postpartum.  Baby to Couplet care / Skin to Skin. Placenta to: BS Feeding: Br/Bo Circ: OP Contraception: [place] 02/06/2014, 11:13 PM      H/H: Lab Results  Component Value Date/Time   HGB 8.5* 02/07/2014  6:02 AM   HCT 26.4* 02/07/2014  6:02 AM    Filed Vitals:   02/08/14 0555  BP: 119/72  Pulse: 78  Temp: 97.6 F (36.4 C)  Resp: 20    Physical Exam: VSS NAD Abd: Appropriately tender, ND, Fundus @U -1 Incision: c/d/i No c/c/e, Neg homan's sign, neg cords Lochia Appropriate  Discharge Diagnoses: Term Pregnancy-delivered and BTL  Discharge Information: Date: 03/19/2011 Activity: pelvic rest Diet: routine  Medications: PNV and Ibuprofen Breast feeding:  Yes Condition: stable Instructions: refer to handout Discharge to: home      Medication List         ferrous sulfate 325 (65 FE) MG tablet  Take 1 tablet (325 mg total) by mouth 2 (two)  times daily with a meal.     ibuprofen 600 MG tablet  Commonly known as:  ADVIL,MOTRIN  Take 1 tablet (600 mg total) by mouth every 6 (six) hours.     prenatal multivitamin Tabs tablet  Take 1 tablet by mouth daily at 12 noon.       Follow-up Information   Follow up with [facility] In 4 weeks. (For Postpartum Visit)    Specialty:  Obstetrics and Gynecology   Contact information:   [place] [address] [phone]      Allen Norris 02/08/2014,9:46 AM

## 2014-02-08 NOTE — Addendum Note (Signed)
Addendum created 02/08/14 1043 by Georgeanne Nim, CRNA   Modules edited: Notes Section   Notes Section:  Delete: [id number]; File: [id number]; Delete: [id number]; File: [id number]

## 2014-02-08 NOTE — Addendum Note (Signed)
Addendum created 02/08/14 1044 by Georgeanne Nim, CRNA   Modules edited: Notes Section   Notes Section:  File: [id number]

## 2014-02-08 NOTE — Anesthesia Postprocedure Evaluation (Signed)
  Anesthesia Post-op Note  Patient: Sylvia Fitzpatrick  Procedure(s) Performed: * No procedures listed *  Patient Location: Mother/Baby  Anesthesia Type:Epidural  Level of Consciousness: awake, alert , oriented and patient cooperative  Airway and Oxygen Therapy: Patient Spontanous Breathing  Post-op Pain: mild  Post-op Assessment: Patient's Cardiovascular Status Stable, Respiratory Function Stable, No headache, No backache, No residual numbness and No residual motor weakness  Post-op Vital Signs: stable  Last Vitals:  Filed Vitals:   02/08/14 0555  BP: 119/72  Pulse: 78  Temp: 36.4 C  Resp: 20    Complications: No apparent anesthesia complications

## 2014-02-09 ENCOUNTER — Other Ambulatory Visit: Payer: 59

## 2014-02-12 ENCOUNTER — Telehealth: Payer: Self-pay | Admitting: *Deleted

## 2014-02-12 DIAGNOSIS — G8918 Other acute postprocedural pain: Secondary | ICD-10-CM

## 2014-02-12 MED ORDER — TRAMADOL HCL 50 MG PO TABS: 50.0000 mg | ORAL_TABLET | Freq: Four times a day (QID) | ORAL | Status: DC | PRN

## 2014-02-12 NOTE — Telephone Encounter (Signed)
Lilli left a message requesting a call about a prescription. Called Sylvia Fitzpatrick- she had a vaginal delivery 02/06/14 and a btl. States was discharged with ibuprofen 600mg  every 6 hours but that it is not enough. States she is having back pain and incisional pain =10, and down to maybe 5 after ibuprofen. Requests other medicine. Discussed with Dr.Anyanwu and prescription for tramadol prescribed .

## 2014-03-14 ENCOUNTER — Ambulatory Visit: Payer: 59 | Admitting: Obstetrics and Gynecology

## 2014-03-19 ENCOUNTER — Ambulatory Visit (INDEPENDENT_AMBULATORY_CARE_PROVIDER_SITE_OTHER): Payer: 59 | Admitting: Obstetrics & Gynecology

## 2014-03-19 ENCOUNTER — Encounter: Payer: Self-pay | Admitting: Obstetrics & Gynecology

## 2014-03-19 NOTE — Patient Instructions (Signed)
HPV Test The HPV (human papillomavirus) test is used to screen for high-risk types with HPV infection. HPV is a group of about 100 related viruses, of which 40 types are genital viruses. Most HPV viruses cause infections that usually resolve without treatment within 2 years. Some HPV infections can cause skin and genital warts (condylomata). HPV types 16, 18, 31 and 45 are considered high-risk types of HPV. High-risk types of HPV do not usually cause visible warts, but if untreated, may lead to cancers of the outlet of the womb (cervix) or anus. An HPV test identifies the DNA (genetic) strands of the HPV infection. Because the test identifies the DNA strands, the test is also referred to as the HPV DNA test. Although HPV is found in both males and females, the HPV test is only used to screen for cervical cancer in females. This test is recommended for females:  With an abnormal Pap test.  After treatment of an abnormal Pap test.  Aged 31 and older.  After treatment of a high-risk HPV infection. The HPV test may be done at the same time as a Pap test in females over the age of 83. Both the HPV and Pap test require a sample of cells from the cervix. PREPARATION FOR TEST  You may be asked to avoid douching, tampons, or vaginal medicines for 48 hours before the HPV test. You will be asked to urinate before the test. For the HPV test, you will need to lie on an exam table with your feet in stirrups. A spatula will be inserted into the vagina. The spatula will be used to swab the cervix for a cell and mucus sample. The sample will be further evaluated in a lab under a microscope. NORMAL FINDINGS  Normal: High-risk HPV is not found.  Ranges for normal findings may vary among different laboratories and hospitals. You should always check with your doctor after having lab work or other tests done to discuss the meaning of your test results and whether your values are considered within normal limits. MEANING  OF TEST An abnormal HPV test means that high-risk HPV is found. Your caregiver may recommend further testing. Your caregiver will go over the test results with you. He or she will and discuss the importance and meaning of your results, as well as treatment options and the need for additional tests, if necessary. OBTAINING THE RESULTS  It is your responsibility to obtain your test results. Ask the lab or department performing the test when and how you will get your results. Document Released: 09/18/2004 Document Revised: 11/16/2011 Document Reviewed: 06/03/2005 [facility] Patient Information 2015 [place], [place]. This information is not intended to replace advice given to you by your health care provider. Make sure you discuss any questions you have with your health care provider.

## 2014-03-19 NOTE — Progress Notes (Signed)
Patient ID: Sylvia Fitzpatrick, female   DOB: 08/11/1981, 33 y.o.   MRN: [id number] Subjective:     Sylvia Fitzpatrick is a 33 y.o. female who presents for a postpartum visit. She is 6 weeks postpartum following a spontaneous vaginal delivery. I have fully reviewed the prenatal and intrapartum course. The delivery was at 40 gestational weeks. Outcome: spontaneous vaginal delivery. Anesthesia: epidural. Postpartum course has been uncomplicated. Baby's course has been uncomplicated. Baby is feeding by bottle. Bleeding no bleeding. Bowel function is normal. Bladder function is normal. Patient is sexually active. Contraception method is tubal ligation. Postpartum depression screening: negative.  The following portions of the patient's history were reviewed and updated as appropriate: allergies, current medications, past family history, past medical history, past social history, past surgical history and problem list.  Review of Systems A comprehensive review of systems was negative.   Objective:    BP 134/86  Pulse 77  Temp(Src) 98.8 F (37.1 C) (Oral)  Wt 181 lb 14.4 oz (82.509 kg)  Breastfeeding? No                  Vulva:  normal  Vagina: normal vagina, no discharge, exudate, lesion, or erythema  Cervix:  no cervical motion tenderness  Corpus: normal size, contour, position, consistency, mobility, non-tender  Adnexa:  no mass, fullness, tenderness           Assessment:     6 weeks postpartum exam. Pap smear not done at today's visit.   Plan:    1. Contraception: tubal ligation 2. Had +HR HPV in 07/2013 3. Follow up in: 4 months for PAP or as needed.

## 2014-03-20 ENCOUNTER — Encounter: Payer: Self-pay | Admitting: Obstetrics & Gynecology

## 2014-07-09 ENCOUNTER — Encounter: Payer: Self-pay | Admitting: Obstetrics & Gynecology

## 2014-07-18 ENCOUNTER — Encounter: Payer: Self-pay | Admitting: *Deleted

## 2014-07-18 ENCOUNTER — Telehealth: Payer: Self-pay | Admitting: *Deleted

## 2014-07-18 ENCOUNTER — Ambulatory Visit: Payer: 59 | Admitting: Obstetrics & Gynecology

## 2014-07-18 NOTE — Telephone Encounter (Signed)
Attempted to contact patient to inform of missed appointment, no answer, left message for patient to call clinic and reschedule appointment. Will send letter.  Letter sent.

## 2014-09-20 ENCOUNTER — Ambulatory Visit: Payer: 59 | Admitting: Obstetrics and Gynecology

## 2014-09-24 ENCOUNTER — Ambulatory Visit: Payer: 59 | Admitting: Obstetrics & Gynecology

## 2014-09-24 ENCOUNTER — Ambulatory Visit: Payer: 59 | Admitting: Obstetrics and Gynecology

## 2014-10-17 ENCOUNTER — Encounter: Payer: Self-pay | Admitting: Obstetrics and Gynecology

## 2014-10-17 ENCOUNTER — Ambulatory Visit (INDEPENDENT_AMBULATORY_CARE_PROVIDER_SITE_OTHER): Payer: 59 | Admitting: Obstetrics and Gynecology

## 2014-10-17 VITALS — BP 132/72 | HR 70 | Temp 98.2°F | Ht 68.5 in | Wt 176.0 lb

## 2014-10-17 DIAGNOSIS — Z1151 Encounter for screening for human papillomavirus (HPV): Secondary | ICD-10-CM

## 2014-10-17 DIAGNOSIS — Z124 Encounter for screening for malignant neoplasm of cervix: Secondary | ICD-10-CM

## 2014-10-17 DIAGNOSIS — Z01419 Encounter for gynecological examination (general) (routine) without abnormal findings: Secondary | ICD-10-CM

## 2014-10-17 LAB — POCT URINALYSIS DIP (DEVICE)
Bilirubin Urine: NEGATIVE
GLUCOSE, UA: NEGATIVE mg/dL
HGB URINE DIPSTICK: NEGATIVE
Ketones, ur: NEGATIVE mg/dL
NITRITE: NEGATIVE
PH: 7 (ref 5.0–8.0)
Protein, ur: NEGATIVE mg/dL
Specific Gravity, Urine: 1.015 (ref 1.005–1.030)
UROBILINOGEN UA: 0.2 mg/dL (ref 0.0–1.0)

## 2014-10-17 LAB — HEPATITIS B SURFACE ANTIGEN: Hepatitis B Surface Ag: NEGATIVE

## 2014-10-17 LAB — HEPATITIS C ANTIBODY: HCV Ab: NEGATIVE

## 2014-10-17 NOTE — Progress Notes (Signed)
  Subjective:     Sylvia Fitzpatrick is a 34 y.o. female G3P3 with LMP 10/04/2014 and BMI 27 who is here for a comprehensive physical exam. The patient reports no problems. The patient is sexually active using BTL for contraception  History   Social History  . Marital Status: Married    Spouse Name: N/A  . Number of Children: N/A  . Years of Education: N/A   Occupational History  . Not on file.   Social History Main Topics  . Smoking status: Never Smoker   . Smokeless tobacco: Never Used  . Alcohol Use: No  . Drug Use: No  . Sexual Activity: Yes    Birth Control/ Protection: Surgical     Comment: three weeks ago   Other Topics Concern  . Not on file   Social History Narrative   Health Maintenance  Topic Date Due  . INFLUENZA VACCINE  04/07/2014  . PAP SMEAR  07/25/2016  . TETANUS/TDAP  11/29/2023   Past Medical History  Diagnosis Date  . No pertinent past medical history   . Headache(784.0)     Prior hx migraines  . PONV (postoperative nausea and vomiting)     n/v after iv anesthetic   Past Surgical History  Procedure Laterality Date  . Multiple tooth extractions    . Tubal ligation Bilateral 02/07/2014    Procedure: POST PARTUM TUBAL LIGATION;  Surgeon: Donnamae Jude, MD;  Location: [facility];  Service: Gynecology;  Laterality: Bilateral;   History reviewed. No pertinent family history.      Review of Systems A comprehensive review of systems was negative.   Objective:      GENERAL: Well-developed, well-nourished female in no acute distress.  HEENT: Normocephalic, atraumatic. Sclerae anicteric.  NECK: Supple. Normal thyroid.  LUNGS: Clear to auscultation bilaterally.  HEART: Regular rate and rhythm. BREASTS: Symmetric in size. No palpable masses or lymphadenopathy, skin changes, or nipple drainage. ABDOMEN: Soft, nontender, nondistended. No organomegaly. PELVIC: Normal external female genitalia. Vagina is pink and rugated.  Normal discharge. Normal  appearing cervix. Uterus is normal in size. No adnexal mass or tenderness. EXTREMITIES: No cyanosis, clubbing, or edema, 2+ distal pulses.    Assessment:    Healthy female exam.      Plan:    pap smear collected Patient advised to perform monthly self breast and vulva exam See After Visit Summary for Counseling Recommendations

## 2014-10-18 LAB — TSH: TSH: 0.898 u[IU]/mL (ref 0.350–4.500)

## 2014-10-18 LAB — HIV ANTIBODY (ROUTINE TESTING W REFLEX): HIV 1&2 Ab, 4th Generation: NONREACTIVE

## 2014-10-18 LAB — RPR

## 2014-10-22 LAB — CYTOLOGY - PAP

## 2014-12-07 ENCOUNTER — Other Ambulatory Visit: Payer: Self-pay | Admitting: *Deleted

## 2014-12-07 DIAGNOSIS — N390 Urinary tract infection, site not specified: Secondary | ICD-10-CM

## 2014-12-07 MED ORDER — CIPROFLOXACIN HCL 500 MG PO TABS: 500.0000 mg | ORAL_TABLET | Freq: Two times a day (BID) | ORAL | Status: DC

## 2015-01-14 ENCOUNTER — Emergency Department (HOSPITAL_COMMUNITY): Payer: 59

## 2015-01-14 ENCOUNTER — Emergency Department (HOSPITAL_COMMUNITY)
Admission: EM | Admit: 2015-01-14 | Discharge: 2015-01-14 | Disposition: A | Discharge status: Home / Self Care | Payer: 59 | Attending: Emergency Medicine | Admitting: Emergency Medicine

## 2015-01-14 ENCOUNTER — Encounter (HOSPITAL_COMMUNITY): Payer: Self-pay | Admitting: Emergency Medicine

## 2015-01-14 DIAGNOSIS — M545 Low back pain, unspecified: Secondary | ICD-10-CM

## 2015-01-14 DIAGNOSIS — S29092A Other injury of muscle and tendon of back wall of thorax, initial encounter: Secondary | ICD-10-CM | POA: Diagnosis not present

## 2015-01-14 DIAGNOSIS — M549 Dorsalgia, unspecified: Secondary | ICD-10-CM

## 2015-01-14 DIAGNOSIS — Z8679 Personal history of other diseases of the circulatory system: Secondary | ICD-10-CM | POA: Diagnosis not present

## 2015-01-14 DIAGNOSIS — Y9389 Activity, other specified: Secondary | ICD-10-CM | POA: Diagnosis not present

## 2015-01-14 DIAGNOSIS — Z792 Long term (current) use of antibiotics: Secondary | ICD-10-CM | POA: Diagnosis not present

## 2015-01-14 DIAGNOSIS — S3992XA Unspecified injury of lower back, initial encounter: Secondary | ICD-10-CM | POA: Insufficient documentation

## 2015-01-14 DIAGNOSIS — Y92410 Unspecified street and highway as the place of occurrence of the external cause: Secondary | ICD-10-CM | POA: Diagnosis not present

## 2015-01-14 DIAGNOSIS — Z79899 Other long term (current) drug therapy: Secondary | ICD-10-CM | POA: Insufficient documentation

## 2015-01-14 DIAGNOSIS — Y998 Other external cause status: Secondary | ICD-10-CM | POA: Insufficient documentation

## 2015-01-14 MED ORDER — HYDROCODONE-ACETAMINOPHEN 5-325 MG PO TABS
2.0000 | ORAL_TABLET | Freq: Once | ORAL | Status: AC
  Administered 2015-01-14: 2 via ORAL
  Filled 2015-01-14: qty 2

## 2015-01-14 MED ORDER — NAPROXEN 500 MG PO TABS: 500.0000 mg | ORAL_TABLET | Freq: Two times a day (BID) | ORAL | Status: DC

## 2015-01-14 MED ORDER — METHOCARBAMOL 500 MG PO TABS
500.0000 mg | ORAL_TABLET | Freq: Once | ORAL | Status: AC
  Administered 2015-01-14: 500 mg via ORAL
  Filled 2015-01-14: qty 1

## 2015-01-14 MED ORDER — METHOCARBAMOL 500 MG PO TABS: 500.0000 mg | ORAL_TABLET | Freq: Two times a day (BID) | ORAL | Status: DC

## 2015-01-14 MED ORDER — TRAMADOL HCL 50 MG PO TABS: 50.0000 mg | ORAL_TABLET | Freq: Four times a day (QID) | ORAL | Status: DC | PRN
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
## 2015-01-16 ENCOUNTER — Emergency Department (HOSPITAL_COMMUNITY)
Admission: EM | Admit: 2015-01-16 | Discharge: 2015-01-16 | Disposition: A | Discharge status: Home / Self Care | Payer: 59 | Attending: Emergency Medicine | Admitting: Emergency Medicine

## 2015-01-16 ENCOUNTER — Emergency Department (HOSPITAL_COMMUNITY): Payer: 59

## 2015-01-16 ENCOUNTER — Emergency Department (HOSPITAL_BASED_OUTPATIENT_CLINIC_OR_DEPARTMENT_OTHER): Payer: 59

## 2015-01-16 ENCOUNTER — Encounter (HOSPITAL_COMMUNITY): Payer: Self-pay | Admitting: Emergency Medicine

## 2015-01-16 DIAGNOSIS — Y92410 Unspecified street and highway as the place of occurrence of the external cause: Secondary | ICD-10-CM | POA: Diagnosis not present

## 2015-01-16 DIAGNOSIS — Y9389 Activity, other specified: Secondary | ICD-10-CM | POA: Diagnosis not present

## 2015-01-16 DIAGNOSIS — S8991XA Unspecified injury of right lower leg, initial encounter: Secondary | ICD-10-CM | POA: Insufficient documentation

## 2015-01-16 DIAGNOSIS — Y998 Other external cause status: Secondary | ICD-10-CM | POA: Insufficient documentation

## 2015-01-16 DIAGNOSIS — M79604 Pain in right leg: Secondary | ICD-10-CM

## 2015-01-16 DIAGNOSIS — M7989 Other specified soft tissue disorders: Secondary | ICD-10-CM

## 2015-01-16 DIAGNOSIS — S199XXA Unspecified injury of neck, initial encounter: Secondary | ICD-10-CM | POA: Insufficient documentation

## 2015-01-16 DIAGNOSIS — S0990XA Unspecified injury of head, initial encounter: Secondary | ICD-10-CM | POA: Insufficient documentation

## 2015-01-16 DIAGNOSIS — Z3202 Encounter for pregnancy test, result negative: Secondary | ICD-10-CM | POA: Diagnosis not present

## 2015-01-16 DIAGNOSIS — M79609 Pain in unspecified limb: Secondary | ICD-10-CM

## 2015-01-16 DIAGNOSIS — S3992XA Unspecified injury of lower back, initial encounter: Secondary | ICD-10-CM | POA: Insufficient documentation

## 2015-01-16 DIAGNOSIS — M791 Myalgia, unspecified site: Secondary | ICD-10-CM

## 2015-01-16 LAB — POC URINE PREG, ED: PREG TEST UR: NEGATIVE

## 2015-01-16 MED ORDER — ACETAMINOPHEN 325 MG PO TABS
650.0000 mg | ORAL_TABLET | Freq: Once | ORAL | Status: AC
  Administered 2015-01-16: 650 mg via ORAL
  Filled 2015-01-16: qty 2
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
## 2015-01-16 NOTE — ED Notes (Signed)
Doppler Ultrasound at bedside
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
## 2015-01-16 NOTE — Discharge Instructions (Signed)
Please call your doctor for a followup appointment within 24-48 hours. When you talk to your doctor please let them know that you were seen in the emergency department and have them acquire all of your records so that they can discuss the findings with you and formulate a treatment plan to fully care for your new and ongoing problems. Please follow-up with her primary care provider Please rest and stay hydrated Please follow-up with orthopedics Please keep leg elevated-toes above nose When active please keep leg wrapped in Ace bandage Please massage with icy hot ointment and apply warm compressions for muscular relief Please continue to monitor symptoms closely and if symptoms are to worsen or change (fever greater than 101, chills, sweating, nausea, vomiting, chest pain, shortness of breathe, difficulty breathing, weakness, numbness, tingling, worsening or changes to pain pattern, fall, injury, inability to control urine or bowel movements, loss of sensation, changes to skin color, visual changes, weakness, dropping of objects) please report back to the Emergency Department immediately.   Muscle Pain Muscle pain (myalgia) may be caused by many things, including:  Overuse or muscle strain, especially if you are not in shape. This is the most common cause of muscle pain.  Injury.  Bruises.  Viruses, such as the flu.  Infectious diseases.  Fibromyalgia, which is a chronic condition that causes muscle tenderness, fatigue, and headache.  Autoimmune diseases, including lupus.  Certain drugs, including ACE inhibitors and statins. Muscle pain may be mild or severe. In most cases, the pain lasts only a short time and goes away without treatment. To diagnose the cause of your muscle pain, your health care provider will take your medical history. This means he or she will ask you when your muscle pain began and what has been happening. If you have not had muscle pain for very long, your health care  provider may want to wait before doing much testing. If your muscle pain has lasted a long time, your health care provider may want to run tests right away. If your health care provider thinks your muscle pain may be caused by illness, you may need to have additional tests to rule out certain conditions.  Treatment for muscle pain depends on the cause. Home care is often enough to relieve muscle pain. Your health care provider may also prescribe anti-inflammatory medicine. HOME CARE INSTRUCTIONS Watch your condition for any changes. The following actions may help to lessen any discomfort you are feeling:  Only take over-the-counter or prescription medicines as directed by your health care provider.  Apply ice to the sore muscle:  Put ice in a plastic bag.  Place a towel between your skin and the bag.  Leave the ice on for 15-20 minutes, 3-4 times a day.  You may alternate applying hot and cold packs to the muscle as directed by your health care provider.  If overuse is causing your muscle pain, slow down your activities until the pain goes away.  Remember that it is normal to feel some muscle pain after starting a workout program. Muscles that have not been used often will be sore at first.  Do regular, gentle exercises if you are not usually active.  Warm up before exercising to lower your risk of muscle pain.  Do not continue working out if the pain is very bad. Bad pain could mean you have injured a muscle. SEEK MEDICAL CARE IF:  Your muscle pain gets worse, and medicines do not help.  You have muscle pain that  lasts longer than 3 days.  You have a rash or fever along with muscle pain.  You have muscle pain after a tick bite.  You have muscle pain while working out, even though you are in good physical condition.  You have redness, soreness, or swelling along with muscle pain.  You have muscle pain after starting a new medicine or changing the dose of a medicine. SEEK  IMMEDIATE MEDICAL CARE IF:  You have trouble breathing.  You have trouble swallowing.  You have muscle pain along with a stiff neck, fever, and vomiting.  You have severe muscle weakness or cannot move part of your body. MAKE SURE YOU:   Understand these instructions.  Will watch your condition.  Will get help right away if you are not doing well or get worse. Document Released: 07/16/2006 Document Revised: 08/29/2013 Document Reviewed: 06/20/2013 [facility] Patient Information 2015 [place], [place]. This information is not intended to replace advice given to you by your health care provider. Make sure you discuss any questions you have with your health care provider.   Emergency Department Resource Guide 1) Find a Doctor and Pay Out of Pocket Although you won't have to find out who is covered by your insurance plan, it is a good idea to ask around and get recommendations. You will then need to call the office and see if the doctor you have chosen will accept you as a new patient and what types of options they offer for patients who are self-pay. Some doctors offer discounts or will set up payment plans for their patients who do not have insurance, but you will need to ask so you aren't surprised when you get to your appointment.  2) Contact Your Local Health Department Not all health departments have doctors that can see patients for sick visits, but many do, so it is worth a call to see if yours does. If you don't know where your local health department is, you can check in your phone book. The CDC also has a tool to help you locate your state's health department, and many state websites also have listings of all of their local health departments.  3) Find a [facility] If your illness is not likely to be very severe or complicated, you may want to try a walk in clinic. These are popping up all over the country in pharmacies, drugstores, and shopping centers. They're usually staffed by  nurse practitioners or physician assistants that have been trained to treat common illnesses and complaints. They're usually fairly quick and inexpensive. However, if you have serious medical issues or chronic medical problems, these are probably not your best option.  No Primary Care Doctor: - Call Health Connect at  [phone] - they can help you locate a primary care doctor that  accepts your insurance, provides certain services, etc. - Physician Referral Service- [phone]  Chronic Pain Problems: Organization         Address  Phone   Notes  [facility]  [phone] Patients need to be referred by their primary care doctor.   Medication Assistance: Organization         Address  Phone   Notes  [facility] Medication [facility] [address] [place], [place] [address] [phone] --Must be a resident of [facility] -- Must have NO insurance coverage whatsoever (no Medicaid/ Medicare, etc.) -- The pt. MUST have a primary care doctor that directs their care regularly and follows  them in the community   Med[facility]ist  [phone]   [employer]  [phone]    Agencies that provide inexpensive medical care: Organization         Address  Phone   Notes  [place]  [phone]   [facility]    [phone]   [facility] [place], [place] [address] [phone]   [place] [address] [place] [phone]   Planned Parenthood    [phone]   [facility]    [phone]   [place] and [address], [place] Phone:  [phone], Fax:  [phone] Hours of Operation:  9 am - 6 pm, M-F.  Also accepts Medicaid/Medicare and self-pay.  [facility] for [place], [address], [place] Phone: [phone], Fax: [phone]. Hours of Operation:  8:30  am - 5:30 pm, M-F.  Also accepts Medicaid and self-pay.  [facility] [place] [address], [place] Phone: [phone]   [place], [place], [place] [phone], Ext. [phone] Mondays & Thursdays: 7-9 AM.  First 15 patients are seen on a first come, first serve basis.    [place] Providers:  Organization         Address  Phone   Notes  [facility] [address], [place] [phone] Also accepts self-pay patients.  [facility] [address], [place]  [phone]   [place], [address], [place] [phone]   [facility] [address], [place] [phone]   Lucianne Lei [address], [place]   [phone] Only accepts [place] Access [place] patients after they have their name applied to their card.   Self-Pay (no insurance) in [facility]:  Organization         Address  Phone   Notes  Sickle Cell Patients, [facility] Internal Medicine [place] [phone]   [facility] Urgent Care [place] [phone]   [facility] [place], [place], [place] [phone]   [facility]/Dr. Osei-Bonsu  [address] [place] or [address], [place] [phone] Phone number for both [place] and [place] locations is the same.  Urgent Medical and [facility] [address], [place] [phone]   [facility] [address] [place] or [address] [phone]   [facility] [address] [place] [phone], phone; [phone], fax Sees patients 1st and 3rd Saturday of every month.  Must not qualify for public or private insurance (i.e. Medicaid, Medicare, [place] Health  Choice, Veterans' Benefits)  Household income should be no more than 200% of the poverty level The clinic cannot treat you if you are pregnant or think you are pregnant  Sexually transmitted diseases are not treated at the clinic.    Dental Care: Organization         Address  Phone  Notes  [facility] Department of [facility] [address] [place], [place] [phone] Accepts children up to age 2 who are enrolled  in Medicaid or [place] Health Choice; pregnant women with a Medicaid card; and children who have applied for Medicaid or [place] Health Choice, but were declined, whose parents can pay a reduced fee at time of service.  [facility] Department of [facility]  [address], [place] [phone] Accepts children up to age 65 who are enrolled in [place] or [place]; pregnant women with a Medicaid card; and children who have applied for Medicaid or [place] Health Choice, but were declined, whose parents can pay a reduced fee at time of service.  [facility] Adult Dental Access PROGRAM  [place] [phone] Patients are seen by appointment only. Walk-ins are not accepted. [place] will see patients 38 years of age and older. Monday - Tuesday (8am-5pm) Most Wednesdays (8:30-5pm) $30 per visit, cash only  [facility] Adult Dental Access PROGRAM  [address], [place] [phone] Patients are seen by appointment only. Walk-ins are not accepted. [place] will see patients 37 years of age and older. One Wednesday Evening (Monthly: Volunteer Based).  $30 per visit, cash only  [place]  [phone] for adults; Children under age 65, call Graduate Pediatric Dentistry at [phone]. Children aged 66-14, please call [phone] to request a pediatric application.  Dental services are provided in all areas of dental care including fillings, crowns and bridges, complete  and partial dentures, implants, gum treatment, root canals, and extractions. Preventive care is also provided. Treatment is provided to both adults and children. Patients are selected via a lottery and there is often a waiting list.   [facility] [address], [place]  [phone] [email or web address]   [facility] [address] [place], [place] [phone], Ext. [phone] Second and Fourth Thursday of each month, opens at 6:30 AM; Clinic ends at 9 AM.  Patients are seen on a first-come first-served basis, and a limited number are seen during each clinic.   [facility]  [address] Hillard Danker [place], [place] [phone]   Eligibility Requirements You must have lived in [place], [place], or [place] counties for at least the last three months.   You cannot be eligible for state or federal sponsored [employer], including [employer], [place], or [employer].   You generally cannot be eligible for healthcare insurance through your employer.    How to apply: Eligibility screenings are held every Tuesday and Wednesday afternoon from 1:00 pm until 4:00 pm. You do not need an appointment for the interview!  [facility] [address] [place], [place]   [place]  [place] Department  [place]  [phone]    Behavioral Health Resources in the Community: Intensive Outpatient Programs Organization         Address  Phone  Notes  [place] Addison. [address], [place], [place] [phone]   [facility] Outpatient [address], [place], [place]   ADS: Alcohol & Drug Svcs [address], [place], [place]   [place] [address],  [place], [place] or [phone]   Substance Abuse Resources Organization          Address  Phone  Notes  Alcohol and Drug Services  [place]  [phone]  The [place]   Chinita Pester  [phone]   Residential & Outpatient Substance Abuse Program  [phone]   Psychological Services Organization         Address  Phone  Notes  [facility] [place]  [place]  [phone]   [place] [address], [place] or [phone]    Mobile Crisis Teams Organization         Address  Phone  Notes  [facility], Mobile Crisis Care Unit  [phone]   [facility]ive Psychotherapeutic Services  [address] [place], [place]   [place] [address], [place] [phone]    Self-Help/Support Groups Organization         Address  Phone             Notes  [place]. of [place] - variety of support groups  [place] Call for more information  Narcotics Anonymous (NA), [facility] [address], [employer] [place]  2 meetings at this location   Special educational needs teacher         Address  Phone  Notes  [facility] Residential Treatment [place],    [place]  [phone]   [facility]  [address] [place] [address], [place], [place]   [place], [place] [phone] Admissions: 8am-3pm M-F  Incentives Substance [place] [address],    [place], [place] [phone]   [facility] [address] [place], [place], [place]   [facility] [address],  [place], [place]   [facility] Programs - Intensive Outpatient [address] Kristeen [address], [place], [place]   [facility] ([place]  [place], [place] [phone] or [phone]   [facility]idential Treatment Services (RTS) [address], [place], [place] Accepts Medicaid  [facility] [place] [address],  [place] [phone] Substance Abuse/Addiction Treatment   [facility] Organization         Address  Phone  Notes  [facility]  [phone]   Domenic Schwab, PhD [address] Arlis Porta [place], [place]   [phone] or [phone]   [place], [place] [phone]   [facility] [address] [place], [place] [phone] Insurance/Medicaid/sponsorship through [facility] and Families [address], [address] [place]                                    [place], [place] [phone] [place] [address], [place] [phone]    Dr. Adele Schilder  [phone]   [facility] Dept. 1) [address] [place] 2) [place] 3)  [place] [address], [place] [phone]  [phone]   [place] [phone] or [phone] (After Hours)
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
## 2015-01-16 NOTE — ED Notes (Addendum)
Unable to administer tylenol as order d/t pyxis not updating with chart. Pharmacy contacted for dose

## 2015-01-16 NOTE — Progress Notes (Signed)
VASCULAR LAB PRELIMINARY  PRELIMINARY  PRELIMINARY  PRELIMINARY  Right lower extremity venous duplex completed.    Preliminary report:  Right:  No evidence of DVT, superficial thrombosis, or Baker's cyst.  Yachet Mattson, RVS 01/16/2015, 12:46 PM

## 2015-01-23 ENCOUNTER — Ambulatory Visit: Payer: 59 | Attending: Sports Medicine | Admitting: Physical Therapy

## 2015-01-23 DIAGNOSIS — M6248 Contracture of muscle, other site: Secondary | ICD-10-CM | POA: Diagnosis not present

## 2015-01-23 DIAGNOSIS — M542 Cervicalgia: Secondary | ICD-10-CM | POA: Insufficient documentation

## 2015-01-23 DIAGNOSIS — M2560 Stiffness of unspecified joint, not elsewhere classified: Secondary | ICD-10-CM | POA: Diagnosis not present

## 2015-01-23 DIAGNOSIS — M62838 Other muscle spasm: Secondary | ICD-10-CM

## 2015-01-23 DIAGNOSIS — M549 Dorsalgia, unspecified: Secondary | ICD-10-CM | POA: Insufficient documentation
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
## 2015-02-06 ENCOUNTER — Encounter: Payer: 59 | Admitting: Physical Therapy

## 2015-02-11 ENCOUNTER — Encounter: Payer: 59 | Admitting: Physical Therapy

## 2015-02-15 ENCOUNTER — Encounter: Payer: 59 | Admitting: Physical Therapy

## 2015-02-19 ENCOUNTER — Encounter: Payer: 59 | Admitting: Physical Therapy

## 2015-02-22 ENCOUNTER — Encounter: Payer: 59 | Admitting: Physical Therapy

## 2015-02-26 ENCOUNTER — Encounter: Payer: 59 | Admitting: Physical Therapy

## 2015-03-01 ENCOUNTER — Encounter: Payer: 59 | Admitting: Physical Therapy

## 2015-04-03 ENCOUNTER — Ambulatory Visit: Payer: 59 | Admitting: Obstetrics & Gynecology

## 2015-04-17 ENCOUNTER — Ambulatory Visit (INDEPENDENT_AMBULATORY_CARE_PROVIDER_SITE_OTHER): Payer: 59 | Admitting: Family Medicine

## 2015-04-17 ENCOUNTER — Telehealth: Payer: Self-pay | Admitting: *Deleted

## 2015-04-17 ENCOUNTER — Ambulatory Visit: Payer: 59 | Admitting: Obstetrics & Gynecology

## 2015-04-17 ENCOUNTER — Encounter: Payer: Self-pay | Admitting: Family Medicine

## 2015-04-17 VITALS — BP 127/70 | HR 70 | Temp 98.3°F | Ht 69.0 in | Wt 175.2 lb

## 2015-04-17 DIAGNOSIS — N949 Unspecified condition associated with female genital organs and menstrual cycle: Secondary | ICD-10-CM | POA: Diagnosis not present

## 2015-04-17 DIAGNOSIS — R102 Pelvic and perineal pain: Secondary | ICD-10-CM

## 2015-04-17 LAB — POCT URINALYSIS DIP (DEVICE)
BILIRUBIN URINE: NEGATIVE
Glucose, UA: NEGATIVE mg/dL
Hgb urine dipstick: NEGATIVE
Ketones, ur: NEGATIVE mg/dL
Nitrite: NEGATIVE
PH: 6 (ref 5.0–8.0)
Protein, ur: NEGATIVE mg/dL
Specific Gravity, Urine: 1.015 (ref 1.005–1.030)
Urobilinogen, UA: 0.2 mg/dL (ref 0.0–1.0)

## 2015-04-17 LAB — POCT PREGNANCY, URINE: PREG TEST UR: NEGATIVE

## 2015-04-17 NOTE — Telephone Encounter (Signed)
Pt has been scheduled for a pelvic [facility]

## 2015-04-17 NOTE — Progress Notes (Signed)
    Subjective:    Patient ID: Sylvia Fitzpatrick is a 34 y.o. female presenting with Pelvic Pain  on 04/17/2015  HPI: Reports pain started last pm at 6:30.  Pain is constant and has not stopped.  Nothing has improved pain.  She states nothing makes it worse. Pain is sharp and feels like a contraction. Pain is right sided and low down. Pain goes down into groin. Denies fever, chills, nausea, vomiting or dysuria.  No vaginal discharge. LNMP was 7/15. Has BTL.  Review of Systems  Constitutional: Negative for fever and chills.  Respiratory: Negative for shortness of breath.   Cardiovascular: Negative for chest pain.  Gastrointestinal: Positive for abdominal pain. Negative for nausea and vomiting.  Genitourinary: Positive for pelvic pain. Negative for dysuria.  Skin: Negative for rash.      Objective:    BP 127/70 mmHg  Pulse 70  Temp(Src) 98.3 F (36.8 C) (Oral)  Ht 5\' 9"  (1.753 m)  Wt 175 lb 3.2 oz (79.47 kg)  BMI 25.86 kg/m2  LMP 03/22/2015  Breastfeeding? No Physical Exam  Constitutional: She is oriented to person, place, and time. She appears well-developed and well-nourished. No distress.  HENT:  Head: Normocephalic and atraumatic.  Eyes: No scleral icterus.  Neck: Neck supple.  Cardiovascular: Normal rate.   Pulmonary/Chest: Effort normal.  Abdominal: Soft. There is tenderness. There is guarding. There is no rebound.  Genitourinary: Vagina normal. Uterus is tender. Uterus is not enlarged. Right adnexum displays tenderness. Masses: fullness. Left adnexum displays no mass and no tenderness.  Neurological: She is alert and oriented to person, place, and time.  Skin: Skin is warm and dry.  Psychiatric: She has a normal mood and affect.   Urinalysis    Component Value Date/Time   COLORURINE YELLOW 12/25/2013 0856   APPEARANCEUR CLEAR 12/25/2013 0856   LABSPEC 1.015 04/17/2015 1313   PHURINE 6.0 04/17/2015 1313   GLUCOSEU NEGATIVE 04/17/2015 1313   HGBUR NEGATIVE  04/17/2015 1313   BILIRUBINUR NEGATIVE 04/17/2015 1313   KETONESUR NEGATIVE 04/17/2015 1313   PROTEINUR NEGATIVE 04/17/2015 1313   UROBILINOGEN 0.2 04/17/2015 1313   NITRITE NEGATIVE 04/17/2015 1313   LEUKOCYTESUR SMALL* 04/17/2015 1313     UPT Neg     Assessment & Plan:   Problem List Items Addressed This Visit    None    Visit Diagnoses    Acute pelvic pain, female    -  Primary    Unclear etiology--nml U/A, neg UPT, Check pelvic sono for ovarian cyst. may use Ibuprofen or tylenol for pain.    Relevant Orders    POCT urinalysis dip (device) (Completed)    Pregnancy, urine POC (Completed)    US Pelvis Complete    US Transvaginal Non-OB        Return if symptoms worsen or fail to improve.  PRATT,TANYA S 04/17/2015 1:02 PM

## 2015-04-17 NOTE — Progress Notes (Signed)
Patient ID: Sylvia Fitzpatrick, female   DOB: March 18, 1981, 34 y.o.   MRN: [id number] Pt has been scheduled for a pelvic [facility].

## 2015-04-17 NOTE — Patient Instructions (Signed)
Pelvic Pain Female pelvic pain can be caused by many different things and start from a variety of places. Pelvic pain refers to pain that is located in the lower half of the abdomen and between your hips. The pain may occur over a short period of time (acute) or may be reoccurring (chronic). The cause of pelvic pain may be related to disorders affecting the female reproductive organs (gynecologic), but it may also be related to the bladder, kidney stones, an intestinal complication, or muscle or skeletal problems. Getting help right away for pelvic pain is important, especially if there has been severe, sharp, or a sudden onset of unusual pain. It is also important to get help right away because some types of pelvic pain can be life threatening.  CAUSES  Below are only some of the causes of pelvic pain. The causes of pelvic pain can be in one of several categories.   Gynecologic.  Pelvic inflammatory disease.  Sexually transmitted infection.  Ovarian cyst or a twisted ovarian ligament (ovarian torsion).  Uterine lining that grows outside the uterus (endometriosis).  Fibroids, cysts, or tumors.  Ovulation.  Pregnancy.  Pregnancy that occurs outside the uterus (ectopic pregnancy).  Miscarriage.  Labor.  Abruption of the placenta or ruptured uterus.  Infection.  Uterine infection (endometritis).  Bladder infection.  Diverticulitis.  Miscarriage related to a uterine infection (septic abortion).  Bladder.  Inflammation of the bladder (cystitis).  Kidney stone(s).  Gastrointestinal.  Constipation.  Diverticulitis.  Neurologic.  Trauma.  Feeling pelvic pain because of mental or emotional causes (psychosomatic).  Cancers of the bowel or pelvis. EVALUATION  Your caregiver will want to take a careful history of your concerns. This includes recent changes in your health, a careful gynecologic history of your periods (menses), and a sexual history. Obtaining your family  history and medical history is also important. Your caregiver may suggest a pelvic exam. A pelvic exam will help identify the location and severity of the pain. It also helps in the evaluation of which organ system may be involved. In order to identify the cause of the pelvic pain and be properly treated, your caregiver may order tests. These tests may include:   A pregnancy test.  Pelvic ultrasonography.  An X-ray exam of the abdomen.  A urinalysis or evaluation of vaginal discharge.  Blood tests. HOME CARE INSTRUCTIONS   Only take over-the-counter or prescription medicines for pain, discomfort, or fever as directed by your caregiver.   Rest as directed by your caregiver.   Eat a balanced diet.   Drink enough fluids to make your urine clear or pale yellow, or as directed.   Avoid sexual intercourse if it causes pain.   Apply warm or cold compresses to the lower abdomen depending on which one helps the pain.   Avoid stressful situations.   Keep a journal of your pelvic pain. Write down when it started, where the pain is located, and if there are things that seem to be associated with the pain, such as food or your menstrual cycle.  Follow up with your caregiver as directed.  SEEK MEDICAL CARE IF:  Your medicine does not help your pain.  You have abnormal vaginal discharge. SEEK IMMEDIATE MEDICAL CARE IF:   You have heavy bleeding from the vagina.   Your pelvic pain increases.   You feel light-headed or faint.   You have chills.   You have pain with urination or blood in your urine.   You have uncontrolled diarrhea   or vomiting.   You have a fever or persistent symptoms for more than 3 days.  You have a fever and your symptoms suddenly get worse.   You are being physically or sexually abused.  MAKE SURE YOU:  Understand these instructions.  Will watch your condition.  Will get help if you are not doing well or get worse. Document Released:  07/21/2004 Document Revised: 01/08/2014 Document Reviewed: 12/14/2011 ExitCare Patient Information 2015 [employer]. This information is not intended to replace advice given to you by your health care provider. Make sure you discuss any questions you have with your health care provider.  

## 2015-04-19 ENCOUNTER — Ambulatory Visit (HOSPITAL_COMMUNITY)
Admission: RE | Admit: 2015-04-19 | Discharge: 2015-04-19 | Disposition: A | Discharge status: Home / Self Care | Payer: 59 | Source: Ambulatory Visit | Attending: Family Medicine | Admitting: Family Medicine

## 2015-04-19 DIAGNOSIS — Z9851 Tubal ligation status: Secondary | ICD-10-CM | POA: Diagnosis not present

## 2015-04-19 DIAGNOSIS — R102 Pelvic and perineal pain: Secondary | ICD-10-CM | POA: Diagnosis present

## 2015-04-22 ENCOUNTER — Telehealth: Payer: Self-pay | Admitting: *Deleted

## 2015-04-22 ENCOUNTER — Telehealth: Payer: Self-pay

## 2015-04-22 NOTE — Telephone Encounter (Addendum)
Per Dr. Kennon Rounds, pt's [place] is normal and to question if she is continuing to have pain.  Called pt and informed pt of normal [place] and asked if she is continuing to have pain.  Pt states that she is having pain from left to right and during intercourse.  I advised pt to go to PCP.  She stated that she did not have one, I gave her contact # to [facility].  I also advised pt that her pain may not be gyn related but to call the front office staff and schedule a follow up appt here at the Clinics concerning her pain. Pt stated understanding with no further questions.

## 2015-04-22 NOTE — Telephone Encounter (Signed)
Sylvia Fitzpatrick called and left a voice message today asking for results of her ultrasound done Friday.

## 2015-04-22 NOTE — Telephone Encounter (Signed)
Called pt and pt informed me that she has already spoken to someone about her results.  Pt stated that she did not have any other questions.

## 2015-09-08 DIAGNOSIS — R160 Hepatomegaly, not elsewhere classified: Secondary | ICD-10-CM

## 2015-09-08 HISTORY — DX: Hepatomegaly, not elsewhere classified: R16.0

## 2016-03-28 ENCOUNTER — Emergency Department (HOSPITAL_COMMUNITY): Payer: 59

## 2016-03-28 ENCOUNTER — Encounter (HOSPITAL_COMMUNITY): Payer: Self-pay

## 2016-03-28 ENCOUNTER — Emergency Department (HOSPITAL_COMMUNITY)
Admission: EM | Admit: 2016-03-28 | Discharge: 2016-03-29 | Disposition: A | Discharge status: Home / Self Care | Payer: 59 | Attending: Emergency Medicine | Admitting: Emergency Medicine

## 2016-03-28 DIAGNOSIS — R102 Pelvic and perineal pain: Secondary | ICD-10-CM | POA: Diagnosis not present

## 2016-03-28 DIAGNOSIS — N8003 Adenomyosis of the uterus: Secondary | ICD-10-CM

## 2016-03-28 DIAGNOSIS — R1032 Left lower quadrant pain: Secondary | ICD-10-CM | POA: Diagnosis not present

## 2016-03-28 DIAGNOSIS — N809 Endometriosis, unspecified: Secondary | ICD-10-CM

## 2016-03-28 DIAGNOSIS — N800 Endometriosis of uterus: Secondary | ICD-10-CM | POA: Diagnosis not present

## 2016-03-28 DIAGNOSIS — R1030 Lower abdominal pain, unspecified: Secondary | ICD-10-CM

## 2016-03-28 LAB — URINALYSIS, ROUTINE W REFLEX MICROSCOPIC
BILIRUBIN URINE: NEGATIVE
GLUCOSE, UA: NEGATIVE mg/dL
HGB URINE DIPSTICK: NEGATIVE
KETONES UR: NEGATIVE mg/dL
NITRITE: NEGATIVE
PH: 6.5 (ref 5.0–8.0)
Protein, ur: NEGATIVE mg/dL
SPECIFIC GRAVITY, URINE: 1.013 (ref 1.005–1.030)

## 2016-03-28 LAB — CBC
HCT: 34.3 % — ABNORMAL LOW (ref 36.0–46.0)
HEMOGLOBIN: 11 g/dL — AB (ref 12.0–15.0)
MCH: 26.3 pg (ref 26.0–34.0)
MCHC: 32.1 g/dL (ref 30.0–36.0)
MCV: 81.9 fL (ref 78.0–100.0)
Platelets: 316 10*3/uL (ref 150–400)
RBC: 4.19 MIL/uL (ref 3.87–5.11)
RDW: 13.6 % (ref 11.5–15.5)
WBC: 5.9 10*3/uL (ref 4.0–10.5)

## 2016-03-28 LAB — LIPASE, BLOOD: Lipase: 16 U/L (ref 11–51)

## 2016-03-28 LAB — COMPREHENSIVE METABOLIC PANEL
ALT: 10 U/L — AB (ref 14–54)
AST: 16 U/L (ref 15–41)
Albumin: 3.8 g/dL (ref 3.5–5.0)
Alkaline Phosphatase: 53 U/L (ref 38–126)
Anion gap: 7 (ref 5–15)
BUN: 5 mg/dL — AB (ref 6–20)
CO2: 27 mmol/L (ref 22–32)
Calcium: 9 mg/dL (ref 8.9–10.3)
Chloride: 102 mmol/L (ref 101–111)
Creatinine, Ser: 0.85 mg/dL (ref 0.44–1.00)
GFR calc Af Amer: >60 mL/min (ref >60)
GFR calc non Af Amer: >60 mL/min (ref >60)
Glucose, Bld: 91 mg/dL (ref 65–99)
Potassium: 3.5 mmol/L (ref 3.5–5.1)
SODIUM: 136 mmol/L (ref 135–145)
Total Bilirubin: 0.9 mg/dL (ref 0.3–1.2)
Total Protein: 6.9 g/dL (ref 6.5–8.1)

## 2016-03-28 LAB — URINE MICROSCOPIC-ADD ON: RBC / HPF: NONE SEEN RBC/hpf (ref 0–5)

## 2016-03-28 LAB — I-STAT BETA HCG BLOOD, ED (MC, WL, AP ONLY): I-stat hCG, quantitative: <5 m[IU]/mL (ref <5)

## 2016-03-28 MED ORDER — SODIUM CHLORIDE 0.9 % IV BOLUS (SEPSIS)
1000.0000 mL | Freq: Once | INTRAVENOUS | Status: AC
  Administered 2016-03-28: 1000 mL via INTRAVENOUS

## 2016-03-28 MED ORDER — MORPHINE SULFATE (PF) 4 MG/ML IV SOLN
6.0000 mg | Freq: Once | INTRAVENOUS | Status: AC
  Administered 2016-03-28: 6 mg via INTRAVENOUS
  Filled 2016-03-28: qty 2

## 2016-03-28 MED ORDER — ONDANSETRON HCL 4 MG/2ML IJ SOLN
4.0000 mg | Freq: Once | INTRAMUSCULAR | Status: AC
  Administered 2016-03-28: 4 mg via INTRAVENOUS
  Filled 2016-03-28: qty 2

## 2016-03-28 MED ORDER — IOPAMIDOL (ISOVUE-300) INJECTION 61%
INTRAVENOUS | Status: AC
  Administered 2016-03-28: 100 mL
  Filled 2016-03-28: qty 100

## 2016-03-28 NOTE — ED Notes (Signed)
Taken to US at this time. 

## 2016-03-28 NOTE — ED Provider Notes (Signed)
CSN: [id number]     Arrival date & time 03/28/16  1710 History   First MD Initiated Contact with Patient 03/28/16 2023     Chief Complaint  Patient presents with  . LLQ abdominal pain      (Consider location/radiation/quality/duration/timing/severity/associated sxs/prior Treatment) HPI Patient presents to the emergency department with left lower abdominal pain that started earlier this morning.  The patient states that she has had this pain intermittently for quite a while but seems worse today.  Patient states that nothing seems make the condition better.  Palpation makes the pain worse.  She states she did not take any medications prior to arrival. The patient denies chest pain, shortness of breath, headache,blurred vision, neck pain, fever, cough, weakness, numbness, dizziness, anorexia, edema,  nausea, vomiting, diarrhea, rash, back pain, dysuria, hematemesis, bloody stool, near syncope, or syncope. Past Medical History  Diagnosis Date  . No pertinent past medical history   . Headache(784.0)     Prior hx migraines  . PONV (postoperative nausea and vomiting)     n/v after iv anesthetic   Past Surgical History  Procedure Laterality Date  . Multiple tooth extractions    . Tubal ligation Bilateral 02/07/2014    Procedure: POST PARTUM TUBAL LIGATION;  Surgeon: Donnamae Jude, MD;  Location: [facility];  Service: Gynecology;  Laterality: Bilateral;   No family history on file. Social History  Substance Use Topics  . Smoking status: Never Smoker   . Smokeless tobacco: Never Used  . Alcohol Use: No   OB History    Gravida Para Term Preterm AB TAB SAB Ectopic Multiple Living   3 3 3  0 0 0 0 0 0 3     Review of Systems  All other systems negative except as documented in the HPI. All pertinent positives and negatives as reviewed in the HPI.  Allergies  Review of patient's allergies indicates no known allergies.  Home Medications   Prior to Admission medications   Not on File   BP  99/58 mmHg  Pulse 92  Temp(Src) 98.7 F (37.1 C) (Oral)  Resp 18  Ht 5\' 8"  (1.727 m)  Wt 70.308 kg  BMI 23.57 kg/m2  SpO2 99%  LMP 03/04/2016 Physical Exam  Constitutional: She is oriented to person, place, and time. She appears well-developed and well-nourished. No distress.  HENT:  Head: Normocephalic and atraumatic.  Mouth/Throat: Oropharynx is clear and moist.  Eyes: Pupils are equal, round, and reactive to light.  Neck: Normal range of motion. Neck supple.  Cardiovascular: Normal rate, regular rhythm and normal heart sounds.  Exam reveals no gallop and no friction rub.   No murmur heard. Pulmonary/Chest: Effort normal and breath sounds normal. No respiratory distress. She has no wheezes.  Abdominal: Soft. Normal appearance and bowel sounds are normal. She exhibits no distension. There is tenderness. There is no rebound and no guarding.    Neurological: She is alert and oriented to person, place, and time. She exhibits normal muscle tone. Coordination normal.  Skin: Skin is warm and dry. No rash noted. No erythema.  Psychiatric: She has a normal mood and affect. Her behavior is normal.  Nursing note and vitals reviewed.   ED Course  Procedures (including critical care time) Labs Review Labs Reviewed  COMPREHENSIVE METABOLIC PANEL - Abnormal; Notable for the following:    BUN 5 (*)    ALT 10 (*)    All other components within normal limits  CBC - Abnormal; Notable for the  following:    Hemoglobin 11.0 (*)    HCT 34.3 (*)    All other components within normal limits  URINALYSIS, ROUTINE W REFLEX MICROSCOPIC (NOT AT [facility]) - Abnormal; Notable for the following:    APPearance CLOUDY (*)    Leukocytes, UA SMALL (*)    All other components within normal limits  URINE MICROSCOPIC-ADD ON - Abnormal; Notable for the following:    Squamous Epithelial / LPF 0-5 (*)    Bacteria, UA RARE (*)    All other components within normal limits  LIPASE, BLOOD  I-STAT BETA HCG BLOOD, ED  ([facility], [facility], [facility] ONLY)    Imaging Review Ct Abdomen Pelvis W Contrast  03/28/2016  CLINICAL DATA:  LEFT lower quadrant pain. History of tubal ligation. EXAM: CT ABDOMEN AND PELVIS WITH CONTRAST TECHNIQUE: Multidetector CT imaging of the abdomen and pelvis was performed using the standard protocol following bolus administration of intravenous contrast. CONTRAST:  167mL ISOVUE-300 IOPAMIDOL (ISOVUE-300) INJECTION 61% COMPARISON:  CT abdomen and pelvis November 26, 2010 FINDINGS: LUNG BASES: Included view of the lung bases are clear. Visualized heart and pericardium are unremarkable. Mild gaseous distended esophagus associated with reflux disease. SOLID ORGANS: The liver is mildly enlarged, 22 cm in cranial caudad dimension. Spleen, gallbladder, pancreas and adrenal glands are unremarkable. GASTROINTESTINAL TRACT: The stomach, small and large bowel are normal in course and caliber without inflammatory changes. Normal appendix. KIDNEYS/ URINARY TRACT: Kidneys are orthotopic, demonstrating symmetric enhancement. No nephrolithiasis, hydronephrosis or solid renal masses. 10 mm LEFT upper pole benign appearing. The unopacified ureters are normal in course and caliber. Delayed imaging through the kidneys demonstrates symmetric prompt contrast excretion within the proximal urinary collecting system. Urinary bladder is partially distended and unremarkable. PERITONEUM/RETROPERITONEUM: Aortoiliac vessels are normal in course and caliber. No lymphadenopathy by CT size criteria. Internal reproductive organs are unremarkable. LEFT tubal ligation clip in situ, RIGHT clip migrated to LEFT upper quadrant. No intraperitoneal free fluid nor free air. SOFT TISSUE/OSSEOUS STRUCTURES: Non-suspicious. IMPRESSION: No acute intra-abdominal or pelvic process. RIGHT tubal ligation clip migrated to LEFT upper quadrant. Mild hepatomegaly. Electronically Signed   By: Elon Alas M.D.   On: 03/28/2016 22:14   I have personally reviewed and  evaluated these images and lab results as part of my medical decision-making.  Patient has a negative CT scan at this time, we will do ultrasound to further assess any lower pelvic pain The patient will be referred back to her GYN doctor, told to return here as needed.  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

## 2016-03-28 NOTE — ED Notes (Signed)
Taken to CT at this time. 

## 2016-03-28 NOTE — ED Notes (Signed)
Patient here with LLQ abdominal pain x 4 hours, describes as sharp pain. No urinary symptoms, denies nausea, denies vomiting

## 2016-03-29 DIAGNOSIS — R102 Pelvic and perineal pain: Secondary | ICD-10-CM | POA: Diagnosis not present

## 2016-03-29 DIAGNOSIS — N858 Other specified noninflammatory disorders of uterus: Secondary | ICD-10-CM | POA: Diagnosis not present

## 2016-03-29 DIAGNOSIS — N800 Endometriosis of uterus: Secondary | ICD-10-CM | POA: Diagnosis not present

## 2016-03-29 MED ORDER — KETOROLAC TROMETHAMINE 30 MG/ML IJ SOLN
30.0000 mg | Freq: Once | INTRAMUSCULAR | Status: AC
  Administered 2016-03-29: 30 mg via INTRAVENOUS
  Filled 2016-03-29: qty 1

## 2016-03-29 MED ORDER — HYDROCODONE-ACETAMINOPHEN 5-325 MG PO TABS: 1.0000 | ORAL_TABLET | Freq: Four times a day (QID) | ORAL | Status: DC | PRN

## 2016-03-29 MED ORDER — FENTANYL CITRATE (PF) 100 MCG/2ML IJ SOLN
50.0000 ug | Freq: Once | INTRAMUSCULAR | Status: AC
  Administered 2016-03-29: 50 ug via INTRAVENOUS
  Filled 2016-03-29: qty 2

## 2016-03-29 MED ORDER — IBUPROFEN 800 MG PO TABS: 800.0000 mg | ORAL_TABLET | Freq: Three times a day (TID) | ORAL | Status: DC | PRN

## 2016-03-29 NOTE — Discharge Instructions (Signed)
Return here as needed.  Follow-up with the GYN clinic provided.  Your ultrasound showed that you have an abnormality of the uterine wall that cause discomfort and pain, especially around her.

## 2016-05-06 ENCOUNTER — Ambulatory Visit: Payer: 59 | Admitting: Obstetrics and Gynecology
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
## 2016-07-16 ENCOUNTER — Telehealth: Payer: Self-pay | Admitting: *Deleted

## 2016-07-16 ENCOUNTER — Ambulatory Visit (INDEPENDENT_AMBULATORY_CARE_PROVIDER_SITE_OTHER): Payer: 59 | Admitting: Physician Assistant

## 2016-07-16 VITALS — BP 130/80 | HR 92 | Temp 98.2°F | Resp 16 | Ht 68.0 in | Wt 177.0 lb

## 2016-07-16 DIAGNOSIS — R8299 Other abnormal findings in urine: Secondary | ICD-10-CM | POA: Diagnosis not present

## 2016-07-16 DIAGNOSIS — N898 Other specified noninflammatory disorders of vagina: Secondary | ICD-10-CM | POA: Diagnosis not present

## 2016-07-16 DIAGNOSIS — R3915 Urgency of urination: Secondary | ICD-10-CM

## 2016-07-16 DIAGNOSIS — R82998 Other abnormal findings in urine: Secondary | ICD-10-CM

## 2016-07-16 LAB — POCT URINALYSIS DIPSTICK
Bilirubin, UA: NEGATIVE
GLUCOSE UA: NEGATIVE
KETONES UA: NEGATIVE
Nitrite, UA: NEGATIVE
Protein, UA: NEGATIVE
SPEC GRAV UA: 1.015
UROBILINOGEN UA: 0.2
pH, UA: 6

## 2016-07-16 LAB — POCT WET + KOH PREP
Trich by wet prep: ABSENT
YEAST BY WET PREP: ABSENT
Yeast by KOH: ABSENT

## 2016-07-16 LAB — POC MICROSCOPIC URINALYSIS (UMFC): MUCUS RE: ABSENT

## 2016-07-16 LAB — POCT URINE PREGNANCY: Preg Test, Ur: NEGATIVE

## 2016-07-16 MED ORDER — NITROFURANTOIN MONOHYD MACRO 100 MG PO CAPS: 100.0000 mg | ORAL_CAPSULE | Freq: Two times a day (BID) | ORAL | 0 refills | Status: DC

## 2016-07-16 NOTE — Patient Instructions (Signed)
     IF you received an x-ray today, you will receive an invoice from [facility]. Please contact [facility] at [phone] with questions or concerns regarding your invoice.   IF you received labwork today, you will receive an invoice from [facility]. Please contact [facility] at [phone] with questions or concerns regarding your invoice.   Our billing staff will not be able to assist you with questions regarding bills from these companies.  You will be contacted with the lab results as soon as they are available. The fastest way to get your results is to activate your My Chart account. Instructions are located on the last page of this paperwork. If you have not heard from us regarding the results in 2 weeks, please contact this office.      

## 2016-07-16 NOTE — Progress Notes (Signed)
07/16/2016 3:28 PM   DOB: [birth year] / MRN: [id number]  SUBJECTIVE:  Sylvia Fitzpatrick is a 35 y.o. female presenting for a UTI.  Complaining of vaginal discharge that is white, malodorous. This has been present for about 2 weeks.  Denies dysuria.  Associates urgency and frequency. She has had a yeast infection and reports this feels somewhat like that.    She has no new sexual partners and has only been with her husband of 6 years. She does not feel that she needs any STI testing today. She is in a hurry to leave because she needs to pick up her soon and would like to expedite her workup.   She has No Known Allergies.   She  has a past medical history of Headache(784.0); No pertinent past medical history; and PONV (postoperative nausea and vomiting).    She  reports that she has never smoked. She has never used smokeless tobacco. She reports that she does not drink alcohol or use drugs. She  reports that she currently engages in sexual activity. She reports using the following method of birth control/protection: Surgical. The patient  has a past surgical history that includes Multiple tooth extractions and Tubal ligation (Bilateral, 02/07/2014).  Her family history is not on file.  Review of Systems  Constitutional: Negative for chills and fever.  Respiratory: Negative for cough.   Genitourinary: Positive for frequency and urgency. Negative for dysuria, flank pain and hematuria.  Musculoskeletal: Negative for myalgias.  Skin: Negative for rash.  Neurological: Negative for dizziness.  Endo/Heme/Allergies: Negative for polydipsia.    The problem list and medications were reviewed and updated by myself where necessary and exist elsewhere in the encounter.   OBJECTIVE:  BP 130/80 (BP Location: Right Arm, Patient Position: Sitting, Cuff Size: Normal)   Pulse 92   Temp 98.2 F (36.8 C) (Oral)   Resp 16   Ht 5\' 8"  (1.727 m)   Wt 177 lb (80.3 kg)   LMP 07/06/2016 (Approximate)   SpO2  100%   BMI 26.91 kg/m   Physical Exam  Constitutional: She is oriented to person, place, and time. She appears well-nourished. No distress.  Eyes: EOM are normal. Pupils are equal, round, and reactive to light.  Cardiovascular: Normal rate and regular rhythm.   Pulmonary/Chest: Effort normal.  Abdominal: Normal appearance and bowel sounds are normal. She exhibits no distension and no mass. There is no tenderness. There is no rebound, no guarding and no CVA tenderness.  Musculoskeletal: Normal range of motion.  Neurological: She is alert and oriented to person, place, and time. No cranial nerve deficit. Gait normal.  Skin: Skin is warm and dry. She is not diaphoretic.  Psychiatric: She has a normal mood and affect.  Vitals reviewed.   Results for orders placed or performed in visit on 07/16/16 (from the past 72 hour(s))  Urinalysis Dipstick     Status: Abnormal   Collection Time: 07/16/16  2:49 PM  Result Value Ref Range   Color, UA yellow    Clarity, UA clear    Glucose, UA neg    Bilirubin, UA neg    Ketones, UA neg    Spec Grav, UA 1.015    Blood, UA Trace    pH, UA 6.0    Protein, UA neg    Urobilinogen, UA 0.2    Nitrite, UA neg    Leukocytes, UA Trace (A) Negative  POCT urine pregnancy     Status: None   Collection  Time: 07/16/16  2:54 PM  Result Value Ref Range   Preg Test, Ur Negative Negative  POCT Microscopic Urinalysis ([facility])     Status: Abnormal   Collection Time: 07/16/16  3:04 PM  Result Value Ref Range   WBC,UR,HPF,POC Few (A) None WBC/hpf   RBC,UR,HPF,POC None None RBC/hpf   Bacteria None None, Too numerous to count   Mucus Absent Absent   Epithelial Cells, UR Per Microscopy None None, Too numerous to count cells/hpf  POCT Wet + KOH Prep     Status: Abnormal   Collection Time: 07/16/16  3:05 PM  Result Value Ref Range   Yeast by KOH Absent Present, Absent   Yeast by wet prep Absent Present, Absent   WBC by wet prep Few None, Few, Too numerous to count     Clue Cells Wet Prep HPF POC None None, Too numerous to count   Trich by wet prep Absent Present, Absent   Bacteria Wet Prep HPF POC Few None, Few, Too numerous to count   Epithelial Cells By [employer] Pref ([facility]) Many (A) None, Few, Too numerous to count   RBC,UR,HPF,POC None None RBC/hpf    No results found.  ASSESSMENT AND PLAN  Nemesis was seen today for vaginal discharge, vaginal odor and vaginal discomfort.  Diagnoses and all orders for this visit:  Vaginal discharge: Her wet prep is normal.  Low suspicion for STD.  Urine shows a clean catch with some leuks.  Will culture and treat for UTI.  RTC if not improving in the next few days.  -     Cancel: Microalbumin/Creatinine Ratio, Urine -     POCT Wet + KOH Prep  Urinary urgency -     Urinalysis Dipstick -     POCT Microscopic Urinalysis ([facility]) -     POCT urine pregnancy -     nitrofurantoin, macrocrystal-monohydrate, (MACROBID) 100 MG capsule; Take 1 capsule (100 mg total) by mouth 2 (two) times daily.  Urine leukocytes -     nitrofurantoin, macrocrystal-monohydrate, (MACROBID) 100 MG capsule; Take 1 capsule (100 mg total) by mouth 2 (two) times daily. -     Urine culture    The patient is advised to call or return to clinic if she does not see an improvement in symptoms, or to seek the care of the closest emergency department if she worsens with the above plan.   Philis Fendt, MHS, PA-C [facility] 07/16/2016 3:28 PM

## 2016-07-16 NOTE — Telephone Encounter (Signed)
Informed patient of negative urine pregnancy test and wet prep negative for yeast. Found some leucocytes in urine so antibiotic sent to pharmacy.

## 2016-07-18 ENCOUNTER — Other Ambulatory Visit: Payer: Self-pay | Admitting: Physician Assistant

## 2016-07-18 LAB — URINE CULTURE

## 2016-07-18 MED ORDER — CEPHALEXIN 500 MG PO CAPS: 500.0000 mg | ORAL_CAPSULE | Freq: Four times a day (QID) | ORAL | 0 refills | Status: DC

## 2016-07-18 NOTE — Progress Notes (Signed)
Spoke with patient who is still symptomatic.  Will start a beta lactam. Philis Fendt, MS, PA-C 8:11 AM, 07/18/2016

## 2016-07-29 ENCOUNTER — Telehealth: Payer: Self-pay | Admitting: Emergency Medicine

## 2016-07-29 NOTE — Telephone Encounter (Signed)
Pt called and wants to know if you would be willing to except her as a new patient? Please adviser thanks.

## 2016-08-04 NOTE — Telephone Encounter (Signed)
Called pt and left VM letting her know to call back to schedule new patient appt.

## 2016-08-04 NOTE — Telephone Encounter (Signed)
Fine

## 2016-08-17 ENCOUNTER — Encounter: Payer: Self-pay | Admitting: Internal Medicine

## 2016-08-17 ENCOUNTER — Other Ambulatory Visit (INDEPENDENT_AMBULATORY_CARE_PROVIDER_SITE_OTHER): Payer: 59

## 2016-08-17 ENCOUNTER — Ambulatory Visit (INDEPENDENT_AMBULATORY_CARE_PROVIDER_SITE_OTHER): Payer: 59 | Admitting: Internal Medicine

## 2016-08-17 VITALS — BP 116/66 | HR 71 | Temp 98.3°F | Resp 14 | Ht 68.0 in | Wt 182.0 lb

## 2016-08-17 DIAGNOSIS — G43109 Migraine with aura, not intractable, without status migrainosus: Secondary | ICD-10-CM

## 2016-08-17 DIAGNOSIS — Z0000 Encounter for general adult medical examination without abnormal findings: Secondary | ICD-10-CM | POA: Diagnosis not present

## 2016-08-17 DIAGNOSIS — M25572 Pain in left ankle and joints of left foot: Secondary | ICD-10-CM | POA: Diagnosis not present

## 2016-08-17 LAB — LIPID PANEL
CHOLESTEROL: 141 mg/dL (ref 0–200)
HDL: 61.1 mg/dL (ref >39.00)
LDL CALC: 63 mg/dL (ref 0–99)
NonHDL: 79.43
TRIGLYCERIDES: 83 mg/dL (ref 0.0–149.0)
Total CHOL/HDL Ratio: 2
VLDL: 16.6 mg/dL (ref 0.0–40.0)

## 2016-08-17 LAB — COMPREHENSIVE METABOLIC PANEL
ALT: 10 U/L (ref 0–35)
AST: 22 U/L (ref 0–37)
Albumin: 4.3 g/dL (ref 3.5–5.2)
Alkaline Phosphatase: 57 U/L (ref 39–117)
BUN: 13 mg/dL (ref 6–23)
CHLORIDE: 103 meq/L (ref 96–112)
CO2: 29 meq/L (ref 19–32)
Calcium: 9.4 mg/dL (ref 8.4–10.5)
Creatinine, Ser: 1.28 mg/dL — ABNORMAL HIGH (ref 0.40–1.20)
GFR: 61.05 mL/min (ref >60.00)
GLUCOSE: 95 mg/dL (ref 70–99)
POTASSIUM: 5 meq/L (ref 3.5–5.1)
SODIUM: 140 meq/L (ref 135–145)
Total Bilirubin: 0.6 mg/dL (ref 0.2–1.2)
Total Protein: 7.8 g/dL (ref 6.0–8.3)

## 2016-08-17 LAB — CBC
HEMATOCRIT: 34.8 % — AB (ref 36.0–46.0)
Hemoglobin: 11.4 g/dL — ABNORMAL LOW (ref 12.0–15.0)
MCHC: 32.8 g/dL (ref 30.0–36.0)
MCV: 81.5 fl (ref 78.0–100.0)
Platelets: 324 10*3/uL (ref 150.0–400.0)
RBC: 4.27 Mil/uL (ref 3.87–5.11)
RDW: 13.5 % (ref 11.5–15.5)
WBC: 9 10*3/uL (ref 4.0–10.5)

## 2016-08-17 LAB — VITAMIN B12: Vitamin B-12: 102 pg/mL — ABNORMAL LOW (ref 211–911)

## 2016-08-17 LAB — VITAMIN D 25 HYDROXY (VIT D DEFICIENCY, FRACTURES): VITD: 15.85 ng/mL — ABNORMAL LOW (ref 30.00–100.00)

## 2016-08-17 LAB — TSH: TSH: 1.18 u[IU]/mL (ref 0.35–4.50)

## 2016-08-17 MED ORDER — SUMATRIPTAN SUCCINATE 25 MG PO TABS: 25.0000 mg | ORAL_TABLET | ORAL | 3 refills | Status: DC | PRN

## 2016-08-17 NOTE — Patient Instructions (Signed)
We have sent in the migraine medicine called sumatriptan which is a medicine you can take to stop migraines. You can take the medicine when you start to feel a migraine.   We are checking the labs today and will call you back with the results.   If you want you can schedule with Dr. Tamala Julian about your feet if they start bothering you again.    High-Fiber Diet  Make sure to drink 4-6 glasses of water per day with high fiber diet.  Fiber, also called dietary fiber, is a type of carbohydrate found in fruits, vegetables, whole grains, and beans. A high-fiber diet can have many health benefits. Your health care provider may recommend a high-fiber diet to help:  Prevent constipation. Fiber can make your bowel movements more regular.  Lower your cholesterol.  Relieve hemorrhoids, uncomplicated diverticulosis, or irritable bowel syndrome.  Prevent overeating as part of a weight-loss plan.  Prevent heart disease, type 2 diabetes, and certain cancers. What is my plan? The recommended daily intake of fiber includes:  38 grams for men under age 39.  42 grams for men over age 6.  69 grams for women under age 68.  72 grams for women over age 54. You can get the recommended daily intake of dietary fiber by eating a variety of fruits, vegetables, grains, and beans. Your health care provider may also recommend a fiber supplement if it is not possible to get enough fiber through your diet. What do I need to know about a high-fiber diet?  Fiber supplements have not been widely studied for their effectiveness, so it is better to get fiber through food sources.  Always check the fiber content on thenutrition facts label of any prepackaged food. Look for foods that contain at least 5 grams of fiber per serving.  Ask your dietitian if you have questions about specific foods that are related to your condition, especially if those foods are not listed in the following section.  Increase your daily  fiber consumption gradually. Increasing your intake of dietary fiber too quickly may cause bloating, cramping, or gas.  Drink plenty of water. Water helps you to digest fiber. What foods can I eat? Grains  Whole-grain breads. Multigrain cereal. Oats and oatmeal. Brown rice. Barley. Bulgur wheat. [place]. Bran muffins. Popcorn. Rye wafer crackers. Vegetables  Sweet potatoes. Spinach. Kale. Artichokes. Cabbage. Broccoli. Green peas. Carrots. Squash. Fruits  Berries. Pears. Apples. Oranges. Avocados. Prunes and raisins. Dried figs. Meats and Other Protein Sources  Navy, kidney, pinto, and soy beans. Split peas. Lentils. Nuts and seeds. Dairy  Fiber-fortified yogurt. Beverages  Fiber-fortified soy milk. Fiber-fortified orange juice. Other  Fiber bars. The items listed above may not be a complete list of recommended foods or beverages. Contact your dietitian for more options.  What foods are not recommended? Grains  White bread. Pasta made with refined flour. White rice. Vegetables  Fried potatoes. Canned vegetables. Well-cooked vegetables. Fruits  Fruit juice. Cooked, strained fruit. Meats and Other Protein Sources  Fatty cuts of meat. Fried [employer] executive or fried fish. Dairy  Milk. Yogurt. Cream cheese. Sour cream. Beverages  Soft drinks. Other  Cakes and pastries. Butter and oils. The items listed above may not be a complete list of foods and beverages to avoid. Contact your dietitian for more information.  What are some tips for including high-fiber foods in my diet?  Eat a wide variety of high-fiber foods.  Make sure that half of all grains consumed each day are whole  grains.  Replace breads and cereals made from refined flour or white flour with whole-grain breads and cereals.  Replace white rice with brown rice, bulgur wheat, or millet.  Start the day with a breakfast that is high in fiber, such as a cereal that contains at least 5 grams of fiber per serving.  Use beans in  place of meat in soups, salads, or pasta.  Eat high-fiber snacks, such as berries, raw vegetables, nuts, or popcorn. This information is not intended to replace advice given to you by your health care provider. Make sure you discuss any questions you have with your health care provider. Document Released: 08/24/2005 Document Revised: 01/30/2016 Document Reviewed: 02/06/2014 Elsevier Interactive Patient Education  2017 [employer].

## 2016-08-17 NOTE — Progress Notes (Signed)
   Subjective:    Patient ID: Sylvia Fitzpatrick, female    DOB: 14-Mar-1981, 35 y.o.   MRN: [id number]  HPI The patient is a new 35 YO female coming in for migraines. She has been struggling with them for some time. She is having them 5-6 per week most of the time. She does take medication for them sometimes but tries to limit this. She has never had migraine medication for stopping the headaches or for prevention. Some are worse than others.  Next concern is left ankle pain and sometimes right ankle pain. She does not know the cause. She denies wearing bad footwear or injury now or in the past.   PMH, [facility], social history reviewed and updated.   Review of Systems  Constitutional: Negative for activity change, appetite change, fatigue, fever and unexpected weight change.  HENT: Negative for congestion, postnasal drip, rhinorrhea, sinus pain and sinus pressure.   Eyes: Negative.   Respiratory: Negative.   Cardiovascular: Negative.   Gastrointestinal: Negative for abdominal distention, abdominal pain, constipation, diarrhea and nausea.  Musculoskeletal: Positive for arthralgias and myalgias. Negative for back pain, gait problem and joint swelling.  Skin: Negative.   Neurological: Positive for headaches. Negative for weakness.  Psychiatric/Behavioral: Negative.       Objective:   Physical Exam  Constitutional: She is oriented to person, place, and time. She appears well-developed and well-nourished.  HENT:  Head: Normocephalic and atraumatic.  Eyes: EOM are normal.  Neck: Normal range of motion.  Cardiovascular: Normal rate and regular rhythm.   Pulmonary/Chest: Effort normal and breath sounds normal. No respiratory distress. She has no wheezes. She has no rales.  Abdominal: Soft. Bowel sounds are normal. She exhibits no distension. There is no tenderness. There is no rebound.  Musculoskeletal: She exhibits no edema.  Some tenderness in the left ankle and top of the foot, no pain on the  plantar surface  Neurological: She is alert and oriented to person, place, and time. Coordination normal.  Skin: Skin is warm and dry.   Vitals:   08/17/16 1524  BP: 116/66  Pulse: 71  Resp: 14  Temp: 98.3 F (36.8 C)  TempSrc: Oral  SpO2: 99%  Weight: 182 lb (82.6 kg)  Height: 5\' 8"  (1.727 m)      Assessment & Plan:

## 2016-08-17 NOTE — Progress Notes (Signed)
Pre visit review using our clinic review tool, if applicable. No additional management support is needed unless otherwise documented below in the visit note. 

## 2016-08-18 ENCOUNTER — Telehealth: Payer: Self-pay

## 2016-08-18 DIAGNOSIS — G43909 Migraine, unspecified, not intractable, without status migrainosus: Secondary | ICD-10-CM | POA: Insufficient documentation

## 2016-08-18 DIAGNOSIS — M2550 Pain in unspecified joint: Secondary | ICD-10-CM | POA: Insufficient documentation

## 2016-08-18 NOTE — Telephone Encounter (Signed)
Sumatriptan was not covered due to having another triptan within the last 30 days per PA department through [facility].

## 2016-08-18 NOTE — Assessment & Plan Note (Signed)
Rx for sumatriptan for the migraines and she will let us know if she wants to take daily medicine for the prevention of migraines.

## 2016-08-18 NOTE — Assessment & Plan Note (Signed)
Advised to see sports medicine (referral placed) and she will think about this. No known triggers. Using otc meds for pain with good success. Not plantar fasciitis.

## 2016-08-24 NOTE — Telephone Encounter (Signed)
Called pharmacy to see what was going on with this medication. They stated that they ran it through medicaid instead of [facility] bc of the cost difference. She would have to pay $135.21 out of pocket under [facility]. Tried calling to let pt know. No answer. LVM for her to call back.

## 2016-08-25 ENCOUNTER — Other Ambulatory Visit: Payer: Self-pay

## 2016-08-25 ENCOUNTER — Telehealth: Payer: Self-pay

## 2016-08-25 MED ORDER — SUMATRIPTAN SUCCINATE 25 MG PO TABS: 25.0000 mg | ORAL_TABLET | ORAL | 3 refills | Status: DC | PRN

## 2016-08-25 NOTE — Telephone Encounter (Signed)
error 

## 2016-08-25 NOTE — Telephone Encounter (Signed)
Patient is having problems getting rx from [facility] on [address]---per patient request, I have called [facility] and called rx into them,  to call [facility] and get rx for sumitriptan transferred over to [facility]---patient aware

## 2016-08-28 ENCOUNTER — Other Ambulatory Visit: Payer: Self-pay | Admitting: Geriatric Medicine

## 2016-08-28 MED ORDER — SUMATRIPTAN SUCCINATE 25 MG PO TABS: 25.0000 mg | ORAL_TABLET | ORAL | 3 refills | Status: DC | PRN

## 2016-09-08 ENCOUNTER — Ambulatory Visit: Payer: 59 | Admitting: Internal Medicine

## 2016-09-08 NOTE — Progress Notes (Deleted)
[name] [address], [place] [address] Phone: [phone] Subjective:    I'm seeing this patient by the request  of:  Hoyt Koch, MD   CC: feet pain   [id number]  Sylvia Fitzpatrick is a 36 y.o. female coming in with complaint of Bilateral foot and ankle pain.     Past Medical History:  Diagnosis Date  . Headache(784.0)    Prior hx migraines  . No pertinent past medical history   . PONV (postoperative nausea and vomiting)    n/v after iv anesthetic   Past Surgical History:  Procedure Laterality Date  . MULTIPLE TOOTH EXTRACTIONS    . TUBAL LIGATION Bilateral 02/07/2014   Procedure: POST PARTUM TUBAL LIGATION;  Surgeon: Donnamae Jude, MD;  Location: [facility];  Service: Gynecology;  Laterality: Bilateral;   Social History   Social History  . Marital status: Married    Spouse name: N/A  . Number of children: N/A  . Years of education: N/A   Social History Main Topics  . Smoking status: Never Smoker  . Smokeless tobacco: Never Used  . Alcohol use No  . Drug use: No  . Sexual activity: Yes    Birth control/ protection: Surgical     Comment: three weeks ago   Other Topics Concern  . Not on file   Social History Narrative  . No narrative on file   No Known Allergies No family history on file.  Past medical history, social, surgical and family history all reviewed in electronic medical record.  No pertanent information unless stated regarding to the chief complaint.   Review of Systems:Review of systems updated and as accurate as of 09/08/16  No headache, visual changes, nausea, vomiting, diarrhea, constipation, dizziness, abdominal pain, skin rash, fevers, chills, night sweats, weight loss, swollen lymph nodes, body aches, joint swelling, muscle aches, chest pain, shortness of breath, mood changes.   Objective  There were no vitals taken for this visit. Systems examined below as of 09/08/16   General: No  apparent distress alert and oriented x3 mood and affect normal, dressed appropriately.  HEENT: Pupils equal, extraocular movements intact  Respiratory: Patient's speak in full sentences and does not appear short of breath  Cardiovascular: No lower extremity edema, non tender, no erythema  Skin: Warm dry intact with no signs of infection or rash on extremities or on axial skeleton.  Abdomen: Soft nontender  Neuro: Cranial nerves II through XII are intact, neurovascularly intact in all extremities with 2+ DTRs and 2+ pulses.  Lymph: No lymphadenopathy of posterior or anterior cervical chain or axillae bilaterally.  Gait normal with good balance and coordination.  MSK:  Non tender with full range of motion and good stability and symmetric strength and tone of shoulders, elbows, wrist, hip, knee and bilaterally.  Ankle: No visible erythema or swelling. Range of motion is full in all directions. Strength is 5/5 in all directions. Stable lateral and medial ligaments; squeeze test and kleiger test unremarkable; Talar dome nontender; No pain at base of 5th MT; No tenderness over cuboid; No tenderness over N spot or navicular prominence No tenderness on posterior aspects of lateral and medial malleolus No sign of peroneal tendon subluxations or tenderness to palpation Negative tarsal tunnel tinel's Able to walk 4 steps. Foot exam shows MSK US performed of: *** This study was ordered, performed, and interpreted by Charlann Boxer D.O.  Foot/Ankle:   All structures visualized.   Talar  dome unremarkable  Ankle mortise without effusion. Peroneus longus and brevis tendons unremarkable on long and transverse views without sheath effusions. Posterior tibialis, flexor hallucis longus, and flexor digitorum longus tendons unremarkable on long and transverse views without sheath effusions. Achilles tendon visualized along length of tendon and unremarkable on long and transverse views without sheath  effusion. Anterior Talofibular Ligament and Calcaneofibular Ligaments unremarkable and intact. Deltoid Ligament unremarkable and intact. Plantar fascia intact and without effusion, normal thickness. No increased doppler signal, cap sign, or thickening of tibial cortex. Power doppler signal normal.  IMPRESSION:  NORMAL ULTRASONOGRAPHIC EXAMINATION OF THE FOOT/ANKLE.     Impression and Recommendations:     This case required medical decision making of moderate complexity.      Note: This dictation was prepared with Dragon dictation along with smaller phrase technology. Any transcriptional errors that result from this process are unintentional.

## 2016-09-09 ENCOUNTER — Telehealth: Payer: Self-pay | Admitting: Emergency Medicine

## 2016-09-09 ENCOUNTER — Ambulatory Visit: Payer: 59 | Admitting: Family Medicine

## 2016-09-09 DIAGNOSIS — E538 Deficiency of other specified B group vitamins: Secondary | ICD-10-CM

## 2016-09-09 NOTE — Telephone Encounter (Signed)
Pt came in and stated when she saw you at her last appt she stated she was staying cold all of the time. You recommended her taking vitamin and she has been taking them but they havent seemed to help. Is there something else you would like her to take or to do? Does she need to make another appt to be seen? Please advise thanks.

## 2016-09-09 NOTE — Telephone Encounter (Signed)
Please Advise

## 2016-09-10 ENCOUNTER — Other Ambulatory Visit (INDEPENDENT_AMBULATORY_CARE_PROVIDER_SITE_OTHER): Payer: 59

## 2016-09-10 DIAGNOSIS — E538 Deficiency of other specified B group vitamins: Secondary | ICD-10-CM

## 2016-09-10 LAB — VITAMIN B12: VITAMIN B 12: 166 pg/mL — AB (ref 211–911)

## 2016-09-10 NOTE — Telephone Encounter (Signed)
Ordered lab to recheck her B12 level. She should come to lab. If not improved she may benefit from B12 shots instead of the pills.

## 2016-09-10 NOTE — Telephone Encounter (Signed)
Patient contacted and knows to come to lab to have B12 rechecked

## 2016-09-11 ENCOUNTER — Ambulatory Visit (INDEPENDENT_AMBULATORY_CARE_PROVIDER_SITE_OTHER): Payer: 59

## 2016-09-11 DIAGNOSIS — E538 Deficiency of other specified B group vitamins: Secondary | ICD-10-CM | POA: Diagnosis not present

## 2016-09-11 MED ORDER — CYANOCOBALAMIN 1000 MCG/ML IJ SOLN
1000.0000 ug | Freq: Once | INTRAMUSCULAR | Status: AC
  Administered 2016-09-11: 1000 ug via INTRAMUSCULAR

## 2016-09-11 NOTE — Progress Notes (Signed)
Pt arrived for B12 injection.   Routing to PCP - how many more B12 injections should pt be scheduled for.

## 2016-09-11 NOTE — Progress Notes (Signed)
Patient ID: CHANNEL TEE, female   DOB: [birth year], 36 y.o.   MRN: [id number] Okay for B12 shot today and needs every 2 weeks for 4 times (including today so 3 more) then monthly thereafter.  Medical treatment/procedure(s) were performed by non-physician practitioner and as supervising physician I was immediately available for consultation/collaboration. I agree with above. Hoyt Koch, MD

## 2016-09-14 ENCOUNTER — Telehealth: Payer: Self-pay

## 2016-09-14 DIAGNOSIS — E538 Deficiency of other specified B group vitamins: Secondary | ICD-10-CM

## 2016-09-14 MED ORDER — CYANOCOBALAMIN 1000 MCG/ML IJ SOLN: 1000.0000 ug | INTRAMUSCULAR | 0 refills | Status: DC

## 2016-09-14 NOTE — Telephone Encounter (Signed)
LVM for pt to call back as soon as possible.   RE: PCP stated that pt can come in and have b12 every 2 weeks for the next month and then monthly after that.   It is left up to the pt.   Order entered

## 2016-09-14 NOTE — Telephone Encounter (Signed)
This appears a prescription was sent to Sylvia Fitzpatrick. Is she giving this to herself?

## 2016-09-23 NOTE — Progress Notes (Deleted)
[name] [place], [place] [address] Phone: [phone] Subjective:    I'm seeing this patient by the request  of:  Hoyt Koch, MD   CC: Foot pain.  [id number]  Sylvia Fitzpatrick is a 36 y.o. female coming in with complaint of foot and ankle pain bilaterally left couldn't and right.     Past Medical History:  Diagnosis Date  . Headache(784.0)    Prior hx migraines  . No pertinent past medical history   . PONV (postoperative nausea and vomiting)    n/v after iv anesthetic   Past Surgical History:  Procedure Laterality Date  . MULTIPLE TOOTH EXTRACTIONS    . TUBAL LIGATION Bilateral 02/07/2014   Procedure: POST PARTUM TUBAL LIGATION;  Surgeon: Donnamae Jude, MD;  Location: [facility];  Service: Gynecology;  Laterality: Bilateral;   Social History   Social History  . Marital status: Married    Spouse name: N/A  . Number of children: N/A  . Years of education: N/A   Social History Main Topics  . Smoking status: Never Smoker  . Smokeless tobacco: Never Used  . Alcohol use No  . Drug use: No  . Sexual activity: Yes    Birth control/ protection: Surgical     Comment: three weeks ago   Other Topics Concern  . Not on file   Social History Narrative  . No narrative on file   No Known Allergies No family history on file.  Past medical history, social, surgical and family history all reviewed in electronic medical record.  No pertanent information unless stated regarding to the chief complaint.   Review of Systems:Review of systems updated and as accurate as of 09/23/16  No headache, visual changes, nausea, vomiting, diarrhea, constipation, dizziness, abdominal pain, skin rash, fevers, chills, night sweats, weight loss, swollen lymph nodes, body aches, joint swelling, muscle aches, chest pain, shortness of breath, mood changes.   Objective  There were no vitals taken for this visit. Systems examined below as of  09/23/16   General: No apparent distress alert and oriented x3 mood and affect normal, dressed appropriately.  HEENT: Pupils equal, extraocular movements intact  Respiratory: Patient's speak in full sentences and does not appear short of breath  Cardiovascular: No lower extremity edema, non tender, no erythema  Skin: Warm dry intact with no signs of infection or rash on extremities or on axial skeleton.  Abdomen: Soft nontender  Neuro: Cranial nerves II through XII are intact, neurovascularly intact in all extremities with 2+ DTRs and 2+ pulses.  Lymph: No lymphadenopathy of posterior or anterior cervical chain or axillae bilaterally.  Gait normal with good balance and coordination.  MSK:  Non tender with full range of motion and good stability and symmetric strength and tone of shoulders, elbows, wrist, hip, knees bilaterally.  Ankle: No visible erythema or swelling. Range of motion is full in all directions. Strength is 5/5 in all directions. Stable lateral and medial ligaments; squeeze test and kleiger test unremarkable; Talar dome nontender; No pain at base of 5th MT; No tenderness over cuboid; No tenderness over N spot or navicular prominence No tenderness on posterior aspects of lateral and medial malleolus No sign of peroneal tendon subluxations or tenderness to palpation Negative tarsal tunnel tinel's Able to walk 4 steps.  MSK US performed of: *** This study was ordered, performed, and interpreted by Charlann Boxer D.O.  Foot/Ankle:   All structures visualized.   Talar  dome unremarkable  Ankle mortise without effusion. Peroneus longus and brevis tendons unremarkable on long and transverse views without sheath effusions. Posterior tibialis, flexor hallucis longus, and flexor digitorum longus tendons unremarkable on long and transverse views without sheath effusions. Achilles tendon visualized along length of tendon and unremarkable on long and transverse views without sheath  effusion. Anterior Talofibular Ligament and Calcaneofibular Ligaments unremarkable and intact. Deltoid Ligament unremarkable and intact. Plantar fascia intact and without effusion, normal thickness. No increased doppler signal, cap sign, or thickening of tibial cortex. Power doppler signal normal.  IMPRESSION:  NORMAL ULTRASONOGRAPHIC EXAMINATION OF THE FOOT/ANKLE.     Impression and Recommendations:     This case required medical decision making of moderate complexity.      Note: This dictation was prepared with Dragon dictation along with smaller phrase technology. Any transcriptional errors that result from this process are unintentional.

## 2016-09-24 ENCOUNTER — Ambulatory Visit: Payer: 59 | Admitting: Family Medicine

## 2016-10-05 NOTE — Progress Notes (Signed)
[facility] [place], [place] [address] Phone: [phone] Subjective:    I'm seeing this patient by the request  of:  Hoyt Koch, MD   CC: left hip pain   [id number]  Sylvia Fitzpatrick is a 36 y.o. female coming in with complaint of Left hip pain. Patient states that his been going on for multiple months. Had severe amount of pain and went to the emergency room. At that time patient did have ultrasound of the pelvic area. Patient's pelvis did show that patient had a fundal cyst of the uterus. Other than that nothing fairly unremarkable. CT of the abdomen was also done. The CT scan did not show any significant bony or soft tissue findings. Patient states that this pain is worse with weightbearing. Worse with walking. Seems to stay localized. Denies any radiation down the leg. Denies any numbness. Sometimes may be associated with some swelling. Denies any back pain.     Past Medical History:  Diagnosis Date  . Headache(784.0)    Prior hx migraines  . No pertinent past medical history   . PONV (postoperative nausea and vomiting)    n/v after iv anesthetic   Past Surgical History:  Procedure Laterality Date  . MULTIPLE TOOTH EXTRACTIONS    . TUBAL LIGATION Bilateral 02/07/2014   Procedure: POST PARTUM TUBAL LIGATION;  Surgeon: Donnamae Jude, MD;  Location: [facility];  Service: Gynecology;  Laterality: Bilateral;   Social History   Social History  . Marital status: Married    Spouse name: N/A  . Number of children: N/A  . Years of education: N/A   Social History Main Topics  . Smoking status: Never Smoker  . Smokeless tobacco: Never Used  . Alcohol use No  . Drug use: No  . Sexual activity: Yes    Birth control/ protection: Surgical     Comment: three weeks ago   Other Topics Concern  . None   Social History Narrative  . None   No Known Allergies No family history on file.  Past medical history, social, surgical  and family history all reviewed in electronic medical record.  No pertanent information unless stated regarding to the chief complaint.   Review of Systems:Review of systems updated and as accurate as of 10/06/16  No headache, visual changes, nausea, vomiting, diarrhea, constipation, dizziness, abdominal pain, skin rash, fevers, chills, night sweats, weight loss, swollen lymph nodes, body aches, joint swelling, muscle aches, chest pain, shortness of breath, mood changes.   Objective  Blood pressure 132/84, pulse 78, height 5\' 8"  (1.727 m), weight 184 lb (83.5 kg), SpO2 99 %. Systems examined below as of 10/06/16   General: No apparent distress alert and oriented x3 mood and affect normal, dressed appropriately.  HEENT: Pupils equal, extraocular movements intact  Respiratory: Patient's speak in full sentences and does not appear short of breath  Cardiovascular: No lower extremity edema, non tender, no erythema  Skin: Warm dry intact with no signs of infection or rash on extremities or on axial skeleton.  Abdomen: Soft nontender  Neuro: Cranial nerves II through XII are intact, neurovascularly intact in all extremities with 2+ DTRs and 2+ pulses.  Lymph: No lymphadenopathy of posterior or anterior cervical chain or axillae bilaterally.  Gait normal with good balance and coordination.  MSK:  Non tender with full range of motion and good stability and symmetric strength and tone of shoulders, elbows, wrist,  knees bilaterally.  Hip:  Left ROM IR: 15 Deg, ER: 35 Deg, Flexion: 100 Deg, Extension: 100 Deg, Abduction: 45 Deg, Adduction: 45 Deg Strength IR: 5/5, ER: 5/5, Flexion: 5/5, Extension: 5/5, Abduction: 4/5, Adduction: 5/5 Pelvic alignment unremarkable to inspection and palpation. Standing hip rotation and gait without trendelenburg sign / unsteadiness. Severe tenderness over the superior aspect of the ilium approximately 3 cm posterior lateral to the ASIS. No SI joint tenderness and normal  minimal SI movement. Contralateral hip unremarkable.  Limited musculoskeletal ultrasound was performed and interpreted by Lyndal Pulley  Limited ultrasound to patient's lateral hip shows the patient does have increasing vascularization and increasing Doppler flow to a soft tissue area. No bony normality. Impression: Soft tissue abnormality with mild increase in Doppler flow or vascularity noted.    Impression and Recommendations:     This case required medical decision making of moderate complexity.      Note: This dictation was prepared with Dragon dictation along with smaller phrase technology. Any transcriptional errors that result from this process are unintentional.

## 2016-10-06 ENCOUNTER — Ambulatory Visit: Payer: Self-pay

## 2016-10-06 ENCOUNTER — Encounter: Payer: Self-pay | Admitting: Family Medicine

## 2016-10-06 ENCOUNTER — Ambulatory Visit (INDEPENDENT_AMBULATORY_CARE_PROVIDER_SITE_OTHER): Payer: 59 | Admitting: Family Medicine

## 2016-10-06 VITALS — BP 132/84 | HR 78 | Ht 68.0 in | Wt 184.0 lb

## 2016-10-06 DIAGNOSIS — M25552 Pain in left hip: Secondary | ICD-10-CM | POA: Diagnosis not present

## 2016-10-06 MED ORDER — DOXYCYCLINE HYCLATE 100 MG PO TABS: 100.0000 mg | ORAL_TABLET | Freq: Two times a day (BID) | ORAL | 0 refills | Status: AC

## 2016-10-06 NOTE — Assessment & Plan Note (Signed)
Patient's left hip pain seems very localized to the lateral aspect of the hip. Patient has involuntary guarding in this area. Differential includes tear of the tensor fascia lata, calcified lymph nodes, unfortunately soft tissue mass. Patient will try some exercises, topical anti-inflammatories, icing protocol. Patient will avoid certain activities. Worsening symptoms or any type of systemic illnesses such as fevers chills or any abnormal weight loss. Follow-up with me again in 2-3 weeks and we will re-ultrasound the area. Worsening symptoms consider MRI pelvis.

## 2016-10-06 NOTE — Patient Instructions (Signed)
Good to see yo u We will treat as a possible muscle injury.  We will monitor closely.  pennsaid pinkie amount topically 2 times daily as needed.  Ice 20 minutes 2 times daily. Usually after activity and before bed. Try to limit your pants hitting the area.  See me again in 2-3 weeks and we will rescan it again.

## 2016-10-08 ENCOUNTER — Other Ambulatory Visit: Payer: 59

## 2016-10-13 ENCOUNTER — Other Ambulatory Visit: Payer: 59

## 2016-10-19 ENCOUNTER — Encounter: Payer: Self-pay | Admitting: Internal Medicine

## 2016-10-19 ENCOUNTER — Ambulatory Visit (INDEPENDENT_AMBULATORY_CARE_PROVIDER_SITE_OTHER): Payer: 59 | Admitting: Internal Medicine

## 2016-10-19 DIAGNOSIS — M25552 Pain in left hip: Secondary | ICD-10-CM

## 2016-10-19 DIAGNOSIS — G43109 Migraine with aura, not intractable, without status migrainosus: Secondary | ICD-10-CM | POA: Diagnosis not present

## 2016-10-19 MED ORDER — DICLOFENAC SODIUM 75 MG PO TBEC: 75.0000 mg | DELAYED_RELEASE_TABLET | Freq: Two times a day (BID) | ORAL | 0 refills | Status: DC

## 2016-10-19 MED ORDER — FLUTICASONE PROPIONATE 50 MCG/ACT NA SUSP: 2.0000 | Freq: Every day | NASAL | 6 refills | Status: DC

## 2016-10-19 NOTE — Progress Notes (Signed)
   Subjective:    Patient ID: Sylvia Fitzpatrick, female    DOB: 10-24-1980, 36 y.o.   MRN: [id number]  HPI  The patient is a 36 YO female coming in for left hip pain. She is worried about it. She is seeing sports medicine tomorrow. She has used the cream which has not helped. She has used some "pain medicine" of her husband and cannot recall the name of it. This helped a little bit. She also has had some headaches recently and took the pain medicine for it and this did not help. She did not try her migraine medication for her headaches. She also did not try tylenol, ibuprofen, aleve, motrin.   Review of Systems  Constitutional: Positive for activity change. Negative for appetite change, chills, fatigue, fever and unexpected weight change.  Respiratory: Negative.   Cardiovascular: Negative.   Gastrointestinal: Negative.   Musculoskeletal: Positive for arthralgias and myalgias. Negative for back pain, gait problem, neck pain and neck stiffness.  Skin: Negative.   Neurological: Positive for headaches.      Objective:   Physical Exam  Constitutional: She is oriented to person, place, and time. She appears well-developed and well-nourished.  HENT:  Head: Normocephalic and atraumatic.  Nose with erythema and mild crusting and swelling. No sinus pressure or pain.   Eyes: EOM are normal.  Cardiovascular: Normal rate and regular rhythm.   Pulmonary/Chest: Effort normal and breath sounds normal.  Abdominal: Soft.  Musculoskeletal: She exhibits tenderness.  Left lateral thigh pain  Neurological: She is alert and oriented to person, place, and time.  Skin: Skin is warm and dry.   Vitals:   10/19/16 0849  BP: 132/78  Pulse: 70  Temp: 98.2 F (36.8 C)  TempSrc: Oral  SpO2: 100%  Weight: 180 lb (81.6 kg)  Height: 5\' 8"  (1.727 m)      Assessment & Plan:

## 2016-10-19 NOTE — Patient Instructions (Signed)
We have sent in diclofenac to the pharmacy for the hip pain that you can take 1 pill twice a day. Keep the appointment with Dr. Tamala Julian tomorrow as he can help you figure out what the cause is.  We have sent in flonase to help with the nose congestion and possibly the headaches. Use 2 sprays in each nostril daily.

## 2016-10-19 NOTE — Assessment & Plan Note (Signed)
She is asked not to take medication which is not prescribed for her or over the counter. Rx for voltaren tablets for pain. She is able to use tylenol as well. She is asked to follow up with sports medicine for the etiology of the pain. Reviewed normal CT findings with her at visit.

## 2016-10-19 NOTE — Progress Notes (Signed)
Pre visit review using our clinic review tool, if applicable. No additional management support is needed unless otherwise documented below in the visit note. 

## 2016-10-19 NOTE — Assessment & Plan Note (Signed)
She is reminded that she can take her sumatriptan for migraine headache. Rx for flonase for the sinuses which appear to be inflamed and some crusting on exam today which could be the trigger.

## 2016-10-19 NOTE — Progress Notes (Deleted)
[facility] [place],  [address] Phone: [phone] Subjective:    I'm seeing this patient by the request  of:  Hoyt Koch, MD   CC: left hip pain   [id number]  Sylvia Fitzpatrick is a 36 y.o. female coming in with complaint of Left hip pain. Patient states that his been going on for multiple months. Had severe amount of pain and went to the emergency room. At that time patient did have ultrasound of the pelvic area. Patient's pelvis did show that patient had a fundal cyst of the uterus. Other than that nothing fairly unremarkable. CT of the abdomen was also done. The CT scan did not show any significant bony or soft tissue findings. Patient states that this pain is worse with weightbearing. Worse with walking. Seems to stay localized. Denies any radiation down the leg. Denies any numbness. Sometimes may be associated with some swelling. Denies any back pain.     Past Medical History:  Diagnosis Date  . Headache(784.0)    Prior hx migraines  . No pertinent past medical history   . PONV (postoperative nausea and vomiting)    n/v after iv anesthetic   Past Surgical History:  Procedure Laterality Date  . MULTIPLE TOOTH EXTRACTIONS    . TUBAL LIGATION Bilateral 02/07/2014   Procedure: POST PARTUM TUBAL LIGATION;  Surgeon: Donnamae Jude, MD;  Location: [facility];  Service: Gynecology;  Laterality: Bilateral;   Social History   Social History  . Marital status: Married    Spouse name: N/A  . Number of children: N/A  . Years of education: N/A   Social History Main Topics  . Smoking status: Never Smoker  . Smokeless tobacco: Never Used  . Alcohol use No  . Drug use: No  . Sexual activity: Yes    Birth control/ protection: Surgical     Comment: three weeks ago   Other Topics Concern  . Not on file   Social History Narrative  . No narrative on file   No Known Allergies No family history on file.  Past medical  history, social, surgical and family history all reviewed in electronic medical record.  No pertanent information unless stated regarding to the chief complaint.   Review of Systems:Review of systems updated and as accurate as of 10/19/16  No headache, visual changes, nausea, vomiting, diarrhea, constipation, dizziness, abdominal pain, skin rash, fevers, chills, night sweats, weight loss, swollen lymph nodes, body aches, joint swelling, muscle aches, chest pain, shortness of breath, mood changes.   Objective  Last menstrual period 10/12/2016. Systems examined below as of 10/19/16   General: No apparent distress alert and oriented x3 mood and affect normal, dressed appropriately.  HEENT: Pupils equal, extraocular movements intact  Respiratory: Patient's speak in full sentences and does not appear short of breath  Cardiovascular: No lower extremity edema, non tender, no erythema  Skin: Warm dry intact with no signs of infection or rash on extremities or on axial skeleton.  Abdomen: Soft nontender  Neuro: Cranial nerves II through XII are intact, neurovascularly intact in all extremities with 2+ DTRs and 2+ pulses.  Lymph: No lymphadenopathy of posterior or anterior cervical chain or axillae bilaterally.  Gait normal with good balance and coordination.  MSK:  Non tender with full range of motion and good stability and symmetric strength and tone of shoulders, elbows, wrist,  knees bilaterally.  Hip: Left ROM IR: 15 Deg, ER: 35 Deg, Flexion:  100 Deg, Extension: 100 Deg, Abduction: 45 Deg, Adduction: 45 Deg Strength IR: 5/5, ER: 5/5, Flexion: 5/5, Extension: 5/5, Abduction: 4/5, Adduction: 5/5 Pelvic alignment unremarkable to inspection and palpation. Standing hip rotation and gait without trendelenburg sign / unsteadiness. Severe tenderness over the superior aspect of the ilium approximately 3 cm posterior lateral to the ASIS. No SI joint tenderness and normal minimal SI  movement. Contralateral hip unremarkable.  Limited musculoskeletal ultrasound was performed and interpreted by Lyndal Pulley  Limited ultrasound to patient's lateral hip shows the patient does have increasing vascularization and increasing Doppler flow to a soft tissue area. No bony normality. Impression: Soft tissue abnormality with mild increase in Doppler flow or vascularity noted.    Impression and Recommendations:     This case required medical decision making of moderate complexity.      Note: This dictation was prepared with Dragon dictation along with smaller phrase technology. Any transcriptional errors that result from this process are unintentional.

## 2016-10-20 ENCOUNTER — Other Ambulatory Visit (INDEPENDENT_AMBULATORY_CARE_PROVIDER_SITE_OTHER): Payer: 59

## 2016-10-20 ENCOUNTER — Ambulatory Visit: Payer: 59 | Admitting: Family Medicine

## 2016-10-20 ENCOUNTER — Encounter: Payer: Self-pay | Admitting: Family Medicine

## 2016-10-20 ENCOUNTER — Ambulatory Visit (INDEPENDENT_AMBULATORY_CARE_PROVIDER_SITE_OTHER): Payer: 59 | Admitting: Family Medicine

## 2016-10-20 ENCOUNTER — Ambulatory Visit: Payer: Self-pay

## 2016-10-20 VITALS — BP 126/80 | HR 62 | Ht 68.0 in | Wt 180.0 lb

## 2016-10-20 DIAGNOSIS — M25552 Pain in left hip: Secondary | ICD-10-CM

## 2016-10-20 DIAGNOSIS — M799 Soft tissue disorder, unspecified: Secondary | ICD-10-CM

## 2016-10-20 DIAGNOSIS — M7989 Other specified soft tissue disorders: Secondary | ICD-10-CM

## 2016-10-20 LAB — C-REACTIVE PROTEIN: CRP: 0.1 mg/dL — AB (ref 0.5–20.0)

## 2016-10-20 LAB — SEDIMENTATION RATE: SED RATE: 24 mm/h — AB (ref 0–20)

## 2016-10-20 NOTE — Patient Instructions (Addendum)
Left hi p MRI will be ordered and we will see what is going on.  We will also get a couple labs today as well.  Hopefully we will get answer.

## 2016-10-20 NOTE — Assessment & Plan Note (Signed)
Discussed with patient again at great length. With patient having this on findings and no significant resolution I do feel that advance imaging is warranted including an MR pelvis with and without contrast. Patient did have ultrasound of the pelvis area that did show possible adenomyosis and we will further evaluate this. We also discussed icing regimen. I do not think that this isn't an intra-articular problem at this point with patient having fairly good range of motion of the hip. Patient though is more sensitive in the soft tissue in the surrounding area of the ilium. Depending on findings we will discuss further evaluation. Labs ordered today for any inflammatory markers.  Spent  25 minutes with patient face-to-face and had greater than 50% of counseling including as described above in assessment and plan.

## 2016-10-20 NOTE — Progress Notes (Signed)
[facility] [place], [place] [address] Phone: [phone] Subjective:    I'm seeing this patient by the request  of:  Hoyt Koch, MD   CC: left hip pain f/u  [id number]  Sylvia Fitzpatrick is a 36 y.o. female coming in with complaint of Left hip pain. Patient states that his been going on for multiple months. Had severe amount of pain and went to the emergency room. At that time patient did have ultrasound of the pelvic area. Patient's pelvis did show that patient had a fundal cyst of the uterus. Other than that nothing fairly unremarkable. CT of the abdomen was also done. The CT scan did not show any significant bony or soft tissue findings. Patient states that this pain is worse with weightbearing.   10/20/16 update. Patient came back today for further evaluation. Patient was trying does vitamin D, iron supplementation, icing protocol and some home exercises. States that she has not had any significant progress and improvement in pain. States that it seems to be stable versus possible worsening. Patient describes it as a dull, throbbing aching sensation. States that it is waking her up at night. States that it is giving her pain when she sits stands for any type of movement.     Past Medical History:  Diagnosis Date  . Headache(784.0)    Prior hx migraines  . No pertinent past medical history   . PONV (postoperative nausea and vomiting)    n/v after iv anesthetic   Past Surgical History:  Procedure Laterality Date  . MULTIPLE TOOTH EXTRACTIONS    . TUBAL LIGATION Bilateral 02/07/2014   Procedure: POST PARTUM TUBAL LIGATION;  Surgeon: Donnamae Jude, MD;  Location: [facility];  Service: Gynecology;  Laterality: Bilateral;   Social History   Social History  . Marital status: Married    Spouse name: N/A  . Number of children: N/A  . Years of education: N/A   Social History Main Topics  . Smoking status: Never Smoker  .  Smokeless tobacco: Never Used  . Alcohol use No  . Drug use: No  . Sexual activity: Yes    Birth control/ protection: Surgical     Comment: three weeks ago   Other Topics Concern  . None   Social History Narrative  . None   No Known Allergies No family history on file.  Past medical history, social, surgical and family history all reviewed in electronic medical record.  No pertanent information unless stated regarding to the chief complaint.   Review of Systems: No headache, visual changes, nausea, vomiting, diarrhea, constipation, dizziness, abdominal pain, skin rash, fevers, chills, night sweats, weight loss, swollen lymph nodes, , chest pain, shortness of breath, mood changes.  Positive for muscle aches  Objective  Blood pressure 126/80, pulse 62, height 5\' 8"  (1.727 m), weight 180 lb (81.6 kg), last menstrual period 10/12/2016.   Systems examined below as of 10/20/16 General: NAD A&O x3 mood, affect normal  HEENT: Pupils equal, extraocular movements intact no nystagmus Respiratory: not short of breath at rest or with speaking Cardiovascular: No lower extremity edema, non tender Skin: Warm dry intact with no signs of infection or rash on extremities or on axial skeleton. Abdomen: Soft nontender, no masses Neuro: Cranial nerves  intact, neurovascularly intact in all extremities with 2+ DTRs and 2+ pulses. Lymph: No lymphadenopathy appreciated today  Gait normal with good balance and coordination.  MSK: Non tender with full  range of motion and good stability and symmetric strength and tone of shoulders, elbows, wrist,  knee hips and ankles bilaterally.   Hip: Left ROM IR: 15 Deg, ER: 35 Deg, Flexion: 100 Deg, Extension: 100 Deg, Abduction: 45 Deg, Adduction: 15 Deg Strength IR: 5/5, ER: 5/5, Flexion: 5/5, Extension: 5/5, Abduction: 4/5, Adduction: 5/5 Pelvic alignment unremarkable to inspection and palpation. Standing hip rotation and gait without trendelenburg sign /  unsteadiness. Patient continues him pain on the left side near the ilium. Seems to be surrounding the ASIS. States that some involuntary guarding also noted today. Possible small cystic mass noted anterior and medial to the ASIS.  Limited musculoskeletal ultrasound was performed and interpreted by Lyndal Pulley  Limited ultrasound to patient's lateral hip shows the patient does decreasing vascularity from previous exam but still does have increasing Doppler flow. Patient does have what appears to be potential calcific lymph nodes within the going all canal. Very nonspecific changes in the area.   Impression and Recommendations:     This case required medical decision making of moderate complexity.      Note: This dictation was prepared with Dragon dictation along with smaller phrase technology. Any transcriptional errors that result from this process are unintentional.

## 2016-10-21 LAB — ANA: ANA: NEGATIVE

## 2016-10-22 ENCOUNTER — Encounter (INDEPENDENT_AMBULATORY_CARE_PROVIDER_SITE_OTHER): Payer: Self-pay

## 2016-10-22 ENCOUNTER — Encounter: Payer: Self-pay | Admitting: Family Medicine

## 2016-10-26 ENCOUNTER — Ambulatory Visit: Payer: 59 | Admitting: Family Medicine

## 2016-10-27 ENCOUNTER — Encounter: Payer: Self-pay | Admitting: Family Medicine

## 2016-11-11 ENCOUNTER — Ambulatory Visit
Admission: RE | Admit: 2016-11-11 | Discharge: 2016-11-11 | Disposition: A | Discharge status: Home / Self Care | Payer: 59 | Source: Ambulatory Visit | Attending: Family Medicine | Admitting: Family Medicine

## 2016-11-11 DIAGNOSIS — M7989 Other specified soft tissue disorders: Secondary | ICD-10-CM

## 2016-11-11 DIAGNOSIS — M25552 Pain in left hip: Secondary | ICD-10-CM

## 2016-11-11 DIAGNOSIS — M1612 Unilateral primary osteoarthritis, left hip: Secondary | ICD-10-CM | POA: Diagnosis not present

## 2016-11-11 MED ORDER — GADOBENATE DIMEGLUMINE 529 MG/ML IV SOLN
17.0000 mL | Freq: Once | INTRAVENOUS | Status: AC | PRN
  Administered 2016-11-11: 17 mL via INTRAVENOUS

## 2016-11-17 DIAGNOSIS — Z30013 Encounter for initial prescription of injectable contraceptive: Secondary | ICD-10-CM | POA: Diagnosis not present

## 2016-11-17 DIAGNOSIS — R87610 Atypical squamous cells of undetermined significance on cytologic smear of cervix (ASC-US): Secondary | ICD-10-CM | POA: Diagnosis not present

## 2016-11-17 DIAGNOSIS — Z01419 Encounter for gynecological examination (general) (routine) without abnormal findings: Secondary | ICD-10-CM | POA: Diagnosis not present

## 2016-11-17 DIAGNOSIS — Z1151 Encounter for screening for human papillomavirus (HPV): Secondary | ICD-10-CM | POA: Diagnosis not present

## 2016-11-17 DIAGNOSIS — Z1389 Encounter for screening for other disorder: Secondary | ICD-10-CM | POA: Diagnosis not present

## 2016-11-17 DIAGNOSIS — Z3009 Encounter for other general counseling and advice on contraception: Secondary | ICD-10-CM | POA: Diagnosis not present

## 2016-11-17 DIAGNOSIS — Z3202 Encounter for pregnancy test, result negative: Secondary | ICD-10-CM | POA: Diagnosis not present

## 2016-11-17 DIAGNOSIS — Z124 Encounter for screening for malignant neoplasm of cervix: Secondary | ICD-10-CM | POA: Diagnosis not present

## 2016-11-17 DIAGNOSIS — Z6827 Body mass index (BMI) 27.0-27.9, adult: Secondary | ICD-10-CM | POA: Diagnosis not present

## 2016-11-17 LAB — HM PAP SMEAR

## 2016-11-19 ENCOUNTER — Other Ambulatory Visit (HOSPITAL_COMMUNITY): Payer: Self-pay | Admitting: Obstetrics and Gynecology

## 2016-11-19 DIAGNOSIS — Z3009 Encounter for other general counseling and advice on contraception: Secondary | ICD-10-CM

## 2016-12-13 ENCOUNTER — Emergency Department (HOSPITAL_COMMUNITY)
Admission: EM | Admit: 2016-12-13 | Discharge: 2016-12-13 | Disposition: A | Discharge status: Home / Self Care | Payer: 59 | Attending: Emergency Medicine | Admitting: Emergency Medicine

## 2016-12-13 ENCOUNTER — Emergency Department (HOSPITAL_COMMUNITY): Payer: 59

## 2016-12-13 DIAGNOSIS — Y9339 Activity, other involving climbing, rappelling and jumping off: Secondary | ICD-10-CM | POA: Diagnosis not present

## 2016-12-13 DIAGNOSIS — S99912A Unspecified injury of left ankle, initial encounter: Secondary | ICD-10-CM | POA: Insufficient documentation

## 2016-12-13 DIAGNOSIS — Z79899 Other long term (current) drug therapy: Secondary | ICD-10-CM | POA: Diagnosis not present

## 2016-12-13 DIAGNOSIS — Y9289 Other specified places as the place of occurrence of the external cause: Secondary | ICD-10-CM | POA: Insufficient documentation

## 2016-12-13 DIAGNOSIS — S90812A Abrasion, left foot, initial encounter: Secondary | ICD-10-CM | POA: Diagnosis not present

## 2016-12-13 DIAGNOSIS — S99922A Unspecified injury of left foot, initial encounter: Secondary | ICD-10-CM | POA: Diagnosis not present

## 2016-12-13 DIAGNOSIS — Y999 Unspecified external cause status: Secondary | ICD-10-CM | POA: Insufficient documentation

## 2016-12-13 MED ORDER — IBUPROFEN 800 MG PO TABS
800.0000 mg | ORAL_TABLET | Freq: Once | ORAL | Status: AC
  Administered 2016-12-13: 800 mg via ORAL
  Filled 2016-12-13: qty 1

## 2016-12-13 MED ORDER — NAPROXEN 500 MG PO TABS: 500.0000 mg | ORAL_TABLET | Freq: Two times a day (BID) | ORAL | 0 refills | Status: DC

## 2016-12-13 MED ORDER — METHOCARBAMOL 500 MG PO TABS: 500.0000 mg | ORAL_TABLET | Freq: Two times a day (BID) | ORAL | 0 refills | Status: DC
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
## 2016-12-13 NOTE — Progress Notes (Signed)
Orthopedic Tech Progress Note Patient Details:  Sylvia Fitzpatrick [birth year] [id number]  Ortho Devices Type of Ortho Device: ASO Ortho Device/Splint Interventions: Application   [name] 12/13/2016, 2:27 PM
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
## 2016-12-13 NOTE — Discharge Instructions (Signed)
Return to the ED with any concerns including difficulty breathing, abdominal pain, numbness/discoloration/swelling of foot or toes or any other alarming symptoms

## 2016-12-18 ENCOUNTER — Ambulatory Visit: Payer: Self-pay

## 2016-12-18 ENCOUNTER — Telehealth: Payer: Self-pay | Admitting: Family Medicine

## 2016-12-18 ENCOUNTER — Ambulatory Visit (INDEPENDENT_AMBULATORY_CARE_PROVIDER_SITE_OTHER): Payer: 59 | Admitting: Family Medicine

## 2016-12-18 ENCOUNTER — Encounter: Payer: Self-pay | Admitting: Family Medicine

## 2016-12-18 VITALS — BP 118/76 | HR 62 | Resp 16 | Wt 183.0 lb

## 2016-12-18 DIAGNOSIS — S9002XA Contusion of left ankle, initial encounter: Secondary | ICD-10-CM | POA: Insufficient documentation

## 2016-12-18 DIAGNOSIS — M79605 Pain in left leg: Secondary | ICD-10-CM

## 2016-12-18 MED ORDER — TRAMADOL HCL 50 MG PO TABS: 50.0000 mg | ORAL_TABLET | Freq: Three times a day (TID) | ORAL | 0 refills | Status: DC | PRN

## 2016-12-18 NOTE — Telephone Encounter (Signed)
Tried calling patient work and [employer]. No answer.

## 2016-12-18 NOTE — Patient Instructions (Signed)
Good to see you  Sylvia Fitzpatrick is your friend.  Air cast would be helpful wear it daily for next 2 weeks. \ Arnica lotion 2 times daily Ice 20 minutes 2 times daily. Usually after activity and before bed. COntinue the naproxen and can take a little tramadol if needed.  Consider a calf compression sleeve can help keep any swelling out.  If not better by Tuesday send a message and we can consider labs

## 2016-12-18 NOTE — Progress Notes (Signed)
Pre-visit discussion using our clinic review tool. No additional management support is needed unless otherwise documented below in the visit note.  
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
## 2016-12-18 NOTE — Telephone Encounter (Signed)
Patient scheduled for 11:15 am today.

## 2016-12-18 NOTE — Assessment & Plan Note (Signed)
Patient does have more of a contusion of the ankle. Discussed with patient at great length. Patient is to wear more of a rigid sole shoe, we discussed over-the-counter topical medicines and given tramadol for breakthrough pain. Discussed that this would likely take a couple weeks until he completely resolved. Patient has other medications for breakthrough pain. Follow-up with me again in 3 weeks if not completely resolved. We did discuss watching for any signs and symptoms of swelling or any type of signs of compartment syndrome.
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
## 2017-01-05 ENCOUNTER — Ambulatory Visit: Payer: 59 | Admitting: Family Medicine
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
## 2017-01-11 ENCOUNTER — Ambulatory Visit: Payer: 59 | Admitting: Family Medicine

## 2017-01-19 ENCOUNTER — Other Ambulatory Visit (INDEPENDENT_AMBULATORY_CARE_PROVIDER_SITE_OTHER): Payer: 59

## 2017-01-19 ENCOUNTER — Other Ambulatory Visit: Payer: Self-pay | Admitting: Internal Medicine

## 2017-01-19 ENCOUNTER — Telehealth: Payer: Self-pay

## 2017-01-19 DIAGNOSIS — R300 Dysuria: Secondary | ICD-10-CM

## 2017-01-19 LAB — URINALYSIS, ROUTINE W REFLEX MICROSCOPIC
Bilirubin Urine: NEGATIVE
KETONES UR: NEGATIVE
NITRITE: POSITIVE — AB
SPECIFIC GRAVITY, URINE: 1.02 (ref 1.000–1.030)
Total Protein, Urine: NEGATIVE
URINE GLUCOSE: NEGATIVE
Urobilinogen, UA: 0.2 (ref 0.0–1.0)
pH: 6 (ref 5.0–8.0)

## 2017-01-19 MED ORDER — NITROFURANTOIN MONOHYD MACRO 100 MG PO CAPS: 100.0000 mg | ORAL_CAPSULE | Freq: Two times a day (BID) | ORAL | 0 refills | Status: DC

## 2017-01-19 NOTE — Telephone Encounter (Signed)
Wanting order put in for down stairs to check for UTI

## 2017-02-22 ENCOUNTER — Emergency Department (HOSPITAL_COMMUNITY)
Admission: EM | Admit: 2017-02-22 | Discharge: 2017-02-22 | Disposition: A | Discharge status: Home / Self Care | Payer: 59 | Attending: Emergency Medicine | Admitting: Emergency Medicine

## 2017-02-22 ENCOUNTER — Encounter (HOSPITAL_COMMUNITY): Payer: Self-pay | Admitting: *Deleted

## 2017-02-22 ENCOUNTER — Emergency Department (HOSPITAL_COMMUNITY): Payer: 59

## 2017-02-22 DIAGNOSIS — Y999 Unspecified external cause status: Secondary | ICD-10-CM | POA: Diagnosis not present

## 2017-02-22 DIAGNOSIS — S40019A Contusion of unspecified shoulder, initial encounter: Secondary | ICD-10-CM

## 2017-02-22 DIAGNOSIS — S40012A Contusion of left shoulder, initial encounter: Secondary | ICD-10-CM | POA: Diagnosis not present

## 2017-02-22 DIAGNOSIS — Y9389 Activity, other specified: Secondary | ICD-10-CM | POA: Insufficient documentation

## 2017-02-22 DIAGNOSIS — Y92019 Unspecified place in single-family (private) house as the place of occurrence of the external cause: Secondary | ICD-10-CM | POA: Diagnosis not present

## 2017-02-22 DIAGNOSIS — M25512 Pain in left shoulder: Secondary | ICD-10-CM | POA: Insufficient documentation

## 2017-02-22 DIAGNOSIS — W200XXA Struck by falling object in cave-in, initial encounter: Secondary | ICD-10-CM | POA: Insufficient documentation

## 2017-02-22 DIAGNOSIS — S4992XA Unspecified injury of left shoulder and upper arm, initial encounter: Secondary | ICD-10-CM | POA: Diagnosis not present

## 2017-02-22 MED ORDER — KETOROLAC TROMETHAMINE 60 MG/2ML IM SOLN
60.0000 mg | Freq: Once | INTRAMUSCULAR | Status: AC
  Administered 2017-02-22: 60 mg via INTRAMUSCULAR
  Filled 2017-02-22: qty 2

## 2017-02-22 MED ORDER — CYCLOBENZAPRINE HCL 10 MG PO TABS: 10.0000 mg | ORAL_TABLET | Freq: Three times a day (TID) | ORAL | 0 refills | Status: DC | PRN

## 2017-02-22 MED ORDER — IBUPROFEN 800 MG PO TABS: 800.0000 mg | ORAL_TABLET | Freq: Three times a day (TID) | ORAL | 0 refills | Status: DC | PRN

## 2017-02-22 NOTE — Discharge Instructions (Signed)
Read the information below.  Use the prescribed medication as directed.  Please discuss all new medications with your pharmacist.  You may return to the Emergency Department at any time for worsening condition or any new symptoms that concern you.    °
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
## 2017-02-22 NOTE — ED Triage Notes (Signed)
Pt reports her ceiling fell on her last night, hitting her L shoulder and R arm.  Bruising noted on pt's R lateral arm.  Pt unable to lift her L arm past her shoulder.  Pt reports pain in her L shoulder radiates to her L arm distally.  Denies L elbow pain.  Denies hitting her head.

## 2017-02-24 ENCOUNTER — Ambulatory Visit: Payer: 59 | Admitting: Family Medicine

## 2017-02-26 ENCOUNTER — Encounter: Payer: Self-pay | Admitting: Nurse Practitioner

## 2017-02-26 ENCOUNTER — Ambulatory Visit (INDEPENDENT_AMBULATORY_CARE_PROVIDER_SITE_OTHER): Payer: 59 | Admitting: Nurse Practitioner

## 2017-02-26 VITALS — BP 120/74 | HR 77 | Temp 98.1°F | Ht 69.0 in | Wt 178.0 lb

## 2017-02-26 DIAGNOSIS — J069 Acute upper respiratory infection, unspecified: Secondary | ICD-10-CM | POA: Diagnosis not present

## 2017-02-26 MED ORDER — DM-GUAIFENESIN ER 30-600 MG PO TB12: 1.0000 | ORAL_TABLET | Freq: Two times a day (BID) | ORAL | 0 refills | Status: DC | PRN

## 2017-02-26 MED ORDER — AZITHROMYCIN 250 MG PO TABS: 250.0000 mg | ORAL_TABLET | Freq: Every day | ORAL | 0 refills | Status: DC

## 2017-02-26 MED ORDER — METHYLPREDNISOLONE ACETATE 40 MG/ML IJ SUSP
40.0000 mg | Freq: Once | INTRAMUSCULAR | Status: AC
  Administered 2017-02-26: 40 mg via INTRAMUSCULAR

## 2017-02-26 MED ORDER — BENZONATATE 100 MG PO CAPS: 100.0000 mg | ORAL_CAPSULE | Freq: Three times a day (TID) | ORAL | 0 refills | Status: DC | PRN

## 2017-02-26 NOTE — Patient Instructions (Signed)
Start azithromycin if no improvement by Sunday.  Patient declined any nasal spray.  URI Instructions: Encourage adequate oral hydration.  Use over-the-counter  "cold" medicines  such as "Tylenol cold" , "Advil cold",  "Mucinex" or" Mucinex D"  for cough and congestion.  Avoid decongestants if you have high blood pressure. Use" Delsym" or" Robitussin" cough syrup varietis for cough.  You can use plain "Tylenol" or "Advi"l for fever, chills and achyness.   "Common cold" symptoms are usually triggered by a virus.  The antibiotics are usually not necessary. On average, a" viral cold" illness would take 4-7 days to resolve. Please, make an appointment if you are not better or if you're worse.

## 2017-02-26 NOTE — Progress Notes (Signed)
Subjective:  Patient ID: Sylvia Fitzpatrick, female    DOB: 11-May-1981  Age: 36 y.o. MRN: [id number]  CC: URI (sore throat,couhing yellow mucus,sinus pressure, congestion, hoarse voice--4 days---took tylenol)   URI   This is a new problem. The current episode started in the past 7 days. The problem has been unchanged. There has been no fever. Associated symptoms include congestion, coughing, ear pain, headaches, a plugged ear sensation, rhinorrhea, sinus pain, a sore throat and swollen glands. Pertinent negatives include no wheezing. She has tried acetaminophen and decongestant for the symptoms. The treatment provided no relief.    Outpatient Medications Prior to Visit  Medication Sig Dispense Refill  . nitrofurantoin, macrocrystal-monohydrate, (MACROBID) 100 MG capsule Take 1 capsule (100 mg total) by mouth 2 (two) times daily. 14 capsule 0  . cyanocobalamin (,VITAMIN B-12,) 1000 MCG/ML injection Inject 1 mL (1,000 mcg total) into the muscle every 30 (thirty) days. (Patient not taking: Reported on 02/26/2017) 1 mL 0  . cyclobenzaprine (FLEXERIL) 10 MG tablet Take 1 tablet (10 mg total) by mouth 3 (three) times daily as needed for muscle spasms (and pain). (Patient not taking: Reported on 02/26/2017) 20 tablet 0  . diclofenac (VOLTAREN) 75 MG EC tablet Take 1 tablet (75 mg total) by mouth 2 (two) times daily. (Patient not taking: Reported on 02/26/2017) 30 tablet 0  . fluticasone (FLONASE) 50 MCG/ACT nasal spray Place 2 sprays into both nostrils daily. (Patient not taking: Reported on 02/26/2017) 16 g 6  . ibuprofen (ADVIL,MOTRIN) 800 MG tablet Take 1 tablet (800 mg total) by mouth every 8 (eight) hours as needed for mild pain or moderate pain. (Patient not taking: Reported on 02/26/2017) 15 tablet 0  . methocarbamol (ROBAXIN) 500 MG tablet Take 1 tablet (500 mg total) by mouth 2 (two) times daily. (Patient not taking: Reported on 02/26/2017) 20 tablet 0  . naproxen (NAPROSYN) 500 MG tablet Take 1  tablet (500 mg total) by mouth 2 (two) times daily. (Patient not taking: Reported on 02/26/2017) 30 tablet 0  . SUMAtriptan (IMITREX) 25 MG tablet Take 1 tablet (25 mg total) by mouth every 2 (two) hours as needed for migraine. May repeat in 2 hours if headache persists or recurs. (Patient not taking: Reported on 02/26/2017) 10 tablet 3  . traMADol (ULTRAM) 50 MG tablet Take 1 tablet (50 mg total) by mouth every 8 (eight) hours as needed. (Patient not taking: Reported on 02/26/2017) 30 tablet 0   No facility-administered medications prior to visit.     ROS See HPI  Objective:  BP 120/74   Pulse 77   Temp 98.1 F (36.7 C)   Ht 5\' 9"  (1.753 m)   Wt 178 lb (80.7 kg)   LMP 01/28/2017   SpO2 99%   BMI 26.29 kg/m   BP Readings from Last 3 Encounters:  02/26/17 120/74  02/22/17 125/90  12/18/16 118/76    Wt Readings from Last 3 Encounters:  02/26/17 178 lb (80.7 kg)  02/22/17 187 lb (84.8 kg)  12/18/16 183 lb (83 kg)    Physical Exam  Constitutional: She is oriented to person, place, and time. No distress.  HENT:  Right Ear: Tympanic membrane, external ear and ear canal normal.  Left Ear: Tympanic membrane, external ear and ear canal normal.  Nose: Mucosal edema and rhinorrhea present. Right sinus exhibits maxillary sinus tenderness. Right sinus exhibits no frontal sinus tenderness. Left sinus exhibits maxillary sinus tenderness. Left sinus exhibits no frontal sinus tenderness.  Mouth/Throat: Uvula  is midline. No trismus in the jaw. Posterior oropharyngeal erythema present. No oropharyngeal exudate.  Eyes: No scleral icterus.  Neck: Normal range of motion. Neck supple.  Cardiovascular: Normal rate and normal heart sounds.   Pulmonary/Chest: Effort normal and breath sounds normal.  Musculoskeletal: She exhibits no edema.  Lymphadenopathy:    She has cervical adenopathy.  Neurological: She is alert and oriented to person, place, and time.  Skin: Skin is warm and dry.  Vitals  reviewed.   Lab Results  Component Value Date   WBC 9.0 08/17/2016   HGB 11.4 (L) 08/17/2016   HCT 34.8 (L) 08/17/2016   PLT 324.0 08/17/2016   GLUCOSE 95 08/17/2016   CHOL 141 08/17/2016   TRIG 83.0 08/17/2016   HDL 61.10 08/17/2016   LDLCALC 63 08/17/2016   ALT 10 08/17/2016   AST 22 08/17/2016   NA 140 08/17/2016   K 5.0 08/17/2016   CL 103 08/17/2016   CREATININE 1.28 (H) 08/17/2016   BUN 13 08/17/2016   CO2 29 08/17/2016   TSH 1.18 08/17/2016    Dg Shoulder Left  Result Date: 02/22/2017 CLINICAL DATA:  36 year old female with injury to the left shoulder. EXAM: LEFT SHOULDER - 2+ VIEW COMPARISON:  None. FINDINGS: There is no evidence of fracture or dislocation. There is no evidence of arthropathy or other focal bone abnormality. Soft tissues are unremarkable. IMPRESSION: Negative. Electronically Signed   By: Anner Crete M.D.   On: 02/22/2017 19:37    Assessment & Plan:   Sylvia Fitzpatrick was seen today for uri.  Diagnoses and all orders for this visit:  Acute URI -     methylPREDNISolone acetate (DEPO-MEDROL) injection 40 mg; Inject 1 mL (40 mg total) into the muscle once. -     dextromethorphan-guaiFENesin (MUCINEX DM) 30-600 MG 12hr tablet; Take 1 tablet by mouth 2 (two) times daily as needed for cough. -     benzonatate (TESSALON) 100 MG capsule; Take 1 capsule (100 mg total) by mouth 3 (three) times daily as needed for cough. -     azithromycin (ZITHROMAX Z-PAK) 250 MG tablet; Take 1 tablet (250 mg total) by mouth daily. Take 2tabs on first day, then 1tab once a day till complete   I have discontinued Sylvia Fitzpatrick's SUMAtriptan, cyanocobalamin, fluticasone, diclofenac, naproxen, methocarbamol, traMADol, nitrofurantoin (macrocrystal-monohydrate), cyclobenzaprine, and ibuprofen. I am also having her start on dextromethorphan-guaiFENesin, benzonatate, and azithromycin. We administered methylPREDNISolone acetate.  Meds ordered this encounter  Medications  .  methylPREDNISolone acetate (DEPO-MEDROL) injection 40 mg  . dextromethorphan-guaiFENesin (MUCINEX DM) 30-600 MG 12hr tablet    Sig: Take 1 tablet by mouth 2 (two) times daily as needed for cough.    Dispense:  14 tablet    Refill:  0    Order Specific Question:   Supervising Provider    Answer:   Binnie Rail [[id number]]  . benzonatate (TESSALON) 100 MG capsule    Sig: Take 1 capsule (100 mg total) by mouth 3 (three) times daily as needed for cough.    Dispense:  20 capsule    Refill:  0    Order Specific Question:   Supervising Provider    Answer:   Binnie Rail [[id number]]  . azithromycin (ZITHROMAX Z-PAK) 250 MG tablet    Sig: Take 1 tablet (250 mg total) by mouth daily. Take 2tabs on first day, then 1tab once a day till complete    Dispense:  6 tablet    Refill:  0  Order Specific Question:   Supervising Provider    Answer:   Binnie Rail [[id number]]    Follow-up: Return if symptoms worsen or fail to improve.  Wilfred Lacy, NP

## 2017-05-12 ENCOUNTER — Other Ambulatory Visit: Payer: 59

## 2017-05-12 ENCOUNTER — Telehealth: Payer: Self-pay

## 2017-05-12 MED ORDER — NITROFURANTOIN MONOHYD MACRO 100 MG PO CAPS: 100.0000 mg | ORAL_CAPSULE | Freq: Two times a day (BID) | ORAL | 0 refills | Status: DC

## 2017-05-12 NOTE — Telephone Encounter (Signed)
Patient notified

## 2017-05-12 NOTE — Telephone Encounter (Signed)
Patient states she thinks she has a UTI and would like labs. Please advise

## 2017-05-12 NOTE — Telephone Encounter (Signed)
Okay to treat without U/A. Sent in [place]. Take 1 pill BID for 1 week.

## 2017-06-11 ENCOUNTER — Telehealth: Payer: Self-pay

## 2017-06-11 DIAGNOSIS — R300 Dysuria: Secondary | ICD-10-CM

## 2017-06-11 NOTE — Telephone Encounter (Signed)
Patient notified

## 2017-06-11 NOTE — Telephone Encounter (Signed)
Patient would like to know if you could put in a order for a urinalysis and a culture

## 2017-06-11 NOTE — Telephone Encounter (Signed)
Order placed

## 2017-06-17 ENCOUNTER — Other Ambulatory Visit (INDEPENDENT_AMBULATORY_CARE_PROVIDER_SITE_OTHER): Payer: 59

## 2017-06-17 ENCOUNTER — Other Ambulatory Visit: Payer: Self-pay | Admitting: Internal Medicine

## 2017-06-17 DIAGNOSIS — R300 Dysuria: Secondary | ICD-10-CM | POA: Diagnosis not present

## 2017-06-17 LAB — URINALYSIS, ROUTINE W REFLEX MICROSCOPIC
Bilirubin Urine: NEGATIVE
Ketones, ur: NEGATIVE
Nitrite: NEGATIVE
SPECIFIC GRAVITY, URINE: 1.02 (ref 1.000–1.030)
TOTAL PROTEIN, URINE-UPE24: NEGATIVE
URINE GLUCOSE: NEGATIVE
Urobilinogen, UA: 0.2 (ref 0.0–1.0)
pH: 6 (ref 5.0–8.0)

## 2017-06-17 MED ORDER — NITROFURANTOIN MONOHYD MACRO 100 MG PO CAPS: 100.0000 mg | ORAL_CAPSULE | Freq: Two times a day (BID) | ORAL | 0 refills | Status: DC

## 2017-07-26 ENCOUNTER — Telehealth: Payer: Self-pay

## 2017-07-26 NOTE — Telephone Encounter (Signed)
Patient wants to know if you will order her a urine culture wants to make sure there is not anything else going on. Please advise thanks

## 2017-07-27 NOTE — Telephone Encounter (Signed)
Can you please call patient and schedule a visit thank you

## 2017-07-27 NOTE — Telephone Encounter (Signed)
Should probably schedule visit.

## 2017-07-28 NOTE — Telephone Encounter (Signed)
LVM for patient to call and schedule appt

## 2017-08-10 ENCOUNTER — Ambulatory Visit (INDEPENDENT_AMBULATORY_CARE_PROVIDER_SITE_OTHER): Payer: 59 | Admitting: Internal Medicine

## 2017-08-10 ENCOUNTER — Encounter: Payer: Self-pay | Admitting: Internal Medicine

## 2017-08-10 ENCOUNTER — Other Ambulatory Visit: Payer: 59

## 2017-08-10 VITALS — BP 112/80 | HR 70 | Temp 98.2°F | Ht 69.0 in | Wt 177.0 lb

## 2017-08-10 DIAGNOSIS — R8290 Unspecified abnormal findings in urine: Secondary | ICD-10-CM | POA: Insufficient documentation

## 2017-08-10 LAB — POCT URINALYSIS DIPSTICK
Bilirubin, UA: NEGATIVE
Glucose, UA: NEGATIVE
KETONES UA: NEGATIVE
Nitrite, UA: NEGATIVE
PH UA: 6 (ref 5.0–8.0)
PROTEIN UA: 15
SPEC GRAV UA: 1.025 (ref 1.010–1.025)
UROBILINOGEN UA: 0.2 U/dL

## 2017-08-10 MED ORDER — SULFAMETHOXAZOLE-TRIMETHOPRIM 800-160 MG PO TABS: 1.0000 | ORAL_TABLET | Freq: Two times a day (BID) | ORAL | 0 refills | Status: DC

## 2017-08-10 NOTE — Patient Instructions (Signed)
We have sent in bactrim to see if we can get rid of the infection. Take 1 pill twice a day for 5 days.

## 2017-08-10 NOTE — Progress Notes (Signed)
   Subjective:    Patient ID: Sylvia Fitzpatrick, female    DOB: [birth year], 36 y.o.   MRN: [id number]  HPI The patient is a 36 YO female coming in for urine odor. Denies burning or frequency. Denies fevers or chills. Denies suprapubic pain. Going on since UTI about 1-2 months ago and never resolved. Currently on period but otherwise no blood or color change.  Review of Systems  Constitutional: Negative.   Respiratory: Negative for cough, chest tightness and shortness of breath.   Cardiovascular: Negative for chest pain, palpitations and leg swelling.  Gastrointestinal: Negative for abdominal distention, abdominal pain, constipation, diarrhea, nausea and vomiting.  Genitourinary: Negative for decreased urine volume, difficulty urinating, dysuria, enuresis, flank pain, frequency, hematuria, pelvic pain and urgency.       Urine odor  Musculoskeletal: Negative.   Skin: Negative.       Objective:   Physical Exam  Constitutional: She is oriented to person, place, and time. She appears well-developed and well-nourished.  HENT:  Head: Normocephalic and atraumatic.  Eyes: EOM are normal.  Neck: Normal range of motion.  Cardiovascular: Normal rate and regular rhythm.  Pulmonary/Chest: Effort normal and breath sounds normal. No respiratory distress. She has no wheezes. She has no rales.  Abdominal: Soft. Bowel sounds are normal. She exhibits no distension. There is no tenderness. There is no rebound.  Musculoskeletal: She exhibits no edema.  Neurological: She is alert and oriented to person, place, and time. Coordination normal.  Skin: Skin is warm and dry.   Vitals:   08/10/17 1548  BP: 112/80  Pulse: 70  Temp: 98.2 F (36.8 C)  TempSrc: Oral  SpO2: 100%  Weight: 177 lb (80.3 kg)  Height: 5\' 9"  (1.753 m)      Assessment & Plan:

## 2017-08-10 NOTE — Assessment & Plan Note (Signed)
Checked POC u/a today and sent for urine culture. Treat with bactrim given leukocytes.

## 2017-08-13 LAB — URINE CULTURE
MICRO NUMBER: 81361535
SPECIMEN QUALITY:: ADEQUATE

## 2017-12-13 DIAGNOSIS — Z01419 Encounter for gynecological examination (general) (routine) without abnormal findings: Secondary | ICD-10-CM | POA: Diagnosis not present

## 2017-12-13 DIAGNOSIS — N926 Irregular menstruation, unspecified: Secondary | ICD-10-CM | POA: Diagnosis not present

## 2017-12-13 DIAGNOSIS — Z6826 Body mass index (BMI) 26.0-26.9, adult: Secondary | ICD-10-CM | POA: Diagnosis not present

## 2017-12-13 DIAGNOSIS — N920 Excessive and frequent menstruation with regular cycle: Secondary | ICD-10-CM | POA: Insufficient documentation

## 2017-12-13 DIAGNOSIS — R319 Hematuria, unspecified: Secondary | ICD-10-CM | POA: Diagnosis not present

## 2017-12-13 DIAGNOSIS — Z1389 Encounter for screening for other disorder: Secondary | ICD-10-CM | POA: Diagnosis not present

## 2017-12-21 ENCOUNTER — Ambulatory Visit (INDEPENDENT_AMBULATORY_CARE_PROVIDER_SITE_OTHER): Payer: 59 | Admitting: Internal Medicine

## 2017-12-21 ENCOUNTER — Encounter: Payer: Self-pay | Admitting: Internal Medicine

## 2017-12-21 VITALS — BP 102/68 | HR 73 | Temp 98.0°F | Ht 69.0 in | Wt 172.0 lb

## 2017-12-21 DIAGNOSIS — Z0000 Encounter for general adult medical examination without abnormal findings: Secondary | ICD-10-CM

## 2017-12-21 LAB — POCT URINALYSIS DIPSTICK
Bilirubin, UA: NEGATIVE
Glucose, UA: NEGATIVE
KETONES UA: NEGATIVE
NITRITE UA: NEGATIVE
PROTEIN UA: NEGATIVE
RBC UA: NEGATIVE
SPEC GRAV UA: 1.025 (ref 1.010–1.025)
UROBILINOGEN UA: 0.2 U/dL
pH, UA: 6 (ref 5.0–8.0)

## 2017-12-21 MED ORDER — SULFAMETHOXAZOLE-TRIMETHOPRIM 800-160 MG PO TABS: 1.0000 | ORAL_TABLET | Freq: Two times a day (BID) | ORAL | 0 refills | Status: DC

## 2017-12-21 NOTE — Progress Notes (Signed)
   Subjective:    Patient ID: Sylvia Fitzpatrick, female    DOB: 1981-08-01, 37 y.o.   MRN: [id number]  HPI The patient is a 37 YO female coming in for wellness.   PMH, [facility], social history reviewed and updated.   Review of Systems  Constitutional: Negative.   HENT: Negative.   Eyes: Negative.   Respiratory: Negative for cough, chest tightness and shortness of breath.   Cardiovascular: Negative for chest pain, palpitations and leg swelling.  Gastrointestinal: Negative for abdominal distention, abdominal pain, constipation, diarrhea, nausea and vomiting.  Musculoskeletal: Negative.   Skin: Negative.   Neurological: Negative.   Psychiatric/Behavioral: Negative.       Objective:   Physical Exam  Constitutional: She is oriented to person, place, and time. She appears well-developed and well-nourished.  HENT:  Head: Normocephalic and atraumatic.  Eyes: EOM are normal.  Neck: Normal range of motion.  Cardiovascular: Normal rate and regular rhythm.  Pulmonary/Chest: Effort normal and breath sounds normal. No respiratory distress. She has no wheezes. She has no rales.  Abdominal: Soft. Bowel sounds are normal. She exhibits no distension. There is no tenderness. There is no rebound.  Musculoskeletal: She exhibits no edema.  Neurological: She is alert and oriented to person, place, and time. Coordination normal.  Skin: Skin is warm and dry.  Psychiatric: She has a normal mood and affect.   Vitals:   12/21/17 1501  BP: 102/68  Pulse: 73  Temp: 98 F (36.7 C)  TempSrc: Oral  SpO2: 99%  Weight: 172 lb (78 kg)  Height: 5\' 9"  (1.753 m)      Assessment & Plan:

## 2017-12-21 NOTE — Patient Instructions (Signed)
Gas-x or beano are available over the counter. The liquid generic form is mylicon drops to use for gas.   I have given you information about foods that are high gas producing.   Health Maintenance, Female Adopting a healthy lifestyle and getting preventive care can go a long way to promote health and wellness. Talk with your health care provider about what schedule of regular examinations is right for you. This is a good chance for you to check in with your provider about disease prevention and staying healthy. In between checkups, there are plenty of things you can do on your own. Experts have done a lot of research about which lifestyle changes and preventive measures are most likely to keep you healthy. Ask your health care provider for more information. Weight and diet Eat a healthy diet  Be sure to include plenty of vegetables, fruits, low-fat dairy products, and lean protein.  Do not eat a lot of foods high in solid fats, added sugars, or salt.  Get regular exercise. This is one of the most important things you can do for your health. ? Most adults should exercise for at least 150 minutes each week. The exercise should increase your heart rate and make you sweat (moderate-intensity exercise). ? Most adults should also do strengthening exercises at least twice a week. This is in addition to the moderate-intensity exercise.  Maintain a healthy weight  Body mass index (BMI) is a measurement that can be used to identify possible weight problems. It estimates body fat based on height and weight. Your health care provider can help determine your BMI and help you achieve or maintain a healthy weight.  For females 18 years of age and older: ? A BMI below 18.5 is considered underweight. ? A BMI of 18.5 to 24.9 is normal. ? A BMI of 25 to 29.9 is considered overweight. ? A BMI of 30 and above is considered obese.  Watch levels of cholesterol and blood lipids  You should start having your  blood tested for lipids and cholesterol at 36 years of age, then have this test every 5 years.  You may need to have your cholesterol levels checked more often if: ? Your lipid or cholesterol levels are high. ? You are older than 36 years of age. ? You are at high risk for heart disease.  Cancer screening Lung Cancer  Lung cancer screening is recommended for adults 43-39 years old who are at high risk for lung cancer because of a history of smoking.  A yearly low-dose CT scan of the lungs is recommended for people who: ? Currently smoke. ? Have quit within the past 15 years. ? Have at least a 30-pack-year history of smoking. A pack year is smoking an average of one pack of cigarettes a day for 1 year.  Yearly screening should continue until it has been 15 years since you quit.  Yearly screening should stop if you develop a health problem that would prevent you from having lung cancer treatment.  Breast Cancer  Practice breast self-awareness. This means understanding how your breasts normally appear and feel.  It also means doing regular breast self-exams. Let your health care provider know about any changes, no matter how small.  If you are in your 20s or 30s, you should have a clinical breast exam (CBE) by a health care provider every 1-3 years as part of a regular health exam.  If you are 45 or older, have a CBE every year.  Also consider having a breast X-ray (mammogram) every year.  If you have a family history of breast cancer, talk to your health care provider about genetic screening.  If you are at high risk for breast cancer, talk to your health care provider about having an MRI and a mammogram every year.  Breast cancer gene (BRCA) assessment is recommended for women who have family members with BRCA-related cancers. BRCA-related cancers include: ? Breast. ? Ovarian. ? Tubal. ? Peritoneal cancers.  Results of the assessment will determine the need for genetic  counseling and BRCA1 and BRCA2 testing.  Cervical Cancer Your health care provider may recommend that you be screened regularly for cancer of the pelvic organs (ovaries, uterus, and vagina). This screening involves a pelvic examination, including checking for microscopic changes to the surface of your cervix (Pap test). You may be encouraged to have this screening done every 3 years, beginning at age 53.  For women ages 31-65, health care providers may recommend pelvic exams and Pap testing every 3 years, or they may recommend the Pap and pelvic exam, combined with testing for human papilloma virus (HPV), every 5 years. Some types of HPV increase your risk of cervical cancer. Testing for HPV may also be done on women of any age with unclear Pap test results.  Other health care providers may not recommend any screening for nonpregnant women who are considered low risk for pelvic cancer and who do not have symptoms. Ask your health care provider if a screening pelvic exam is right for you.  If you have had past treatment for cervical cancer or a condition that could lead to cancer, you need Pap tests and screening for cancer for at least 20 years after your treatment. If Pap tests have been discontinued, your risk factors (such as having a new sexual partner) need to be reassessed to determine if screening should resume. Some women have medical problems that increase the chance of getting cervical cancer. In these cases, your health care provider may recommend more frequent screening and Pap tests.  Colorectal Cancer  This type of cancer can be detected and often prevented.  Routine colorectal cancer screening usually begins at 36 years of age and continues through 36 years of age.  Your health care provider may recommend screening at an earlier age if you have risk factors for colon cancer.  Your health care provider may also recommend using home test kits to check for hidden blood in the  stool.  A small camera at the end of a tube can be used to examine your colon directly (sigmoidoscopy or colonoscopy). This is done to check for the earliest forms of colorectal cancer.  Routine screening usually begins at age 66.  Direct examination of the colon should be repeated every 5-10 years through 36 years of age. However, you may need to be screened more often if early forms of precancerous polyps or small growths are found.  Skin Cancer  Check your skin from head to toe regularly.  Tell your health care provider about any new moles or changes in moles, especially if there is a change in a mole's shape or color.  Also tell your health care provider if you have a mole that is larger than the size of a pencil eraser.  Always use sunscreen. Apply sunscreen liberally and repeatedly throughout the day.  Protect yourself by wearing long sleeves, pants, a wide-brimmed hat, and sunglasses whenever you are outside.  Heart disease, diabetes, and high  blood pressure  High blood pressure causes heart disease and increases the risk of stroke. High blood pressure is more likely to develop in: ? People who have blood pressure in the high end of the normal range (130-139/85-89 mm Hg). ? People who are overweight or obese. ? People who are African American.  If you are 3-100 years of age, have your blood pressure checked every 3-5 years. If you are 49 years of age or older, have your blood pressure checked every year. You should have your blood pressure measured twice-once when you are at a hospital or clinic, and once when you are not at a hospital or clinic. Record the average of the two measurements. To check your blood pressure when you are not at a hospital or clinic, you can use: ? An automated blood pressure machine at a pharmacy. ? A home blood pressure monitor.  If you are between 62 years and 2 years old, ask your health care provider if you should take aspirin to prevent  strokes.  Have regular diabetes screenings. This involves taking a blood sample to check your fasting blood sugar level. ? If you are at a normal weight and have a low risk for diabetes, have this test once every three years after 36 years of age. ? If you are overweight and have a high risk for diabetes, consider being tested at a younger age or more often. Preventing infection Hepatitis B  If you have a higher risk for hepatitis B, you should be screened for this virus. You are considered at high risk for hepatitis B if: ? You were born in a country where hepatitis B is common. Ask your health care provider which countries are considered high risk. ? Your parents were born in a high-risk country, and you have not been immunized against hepatitis B (hepatitis B vaccine). ? You have HIV or AIDS. ? You use needles to inject street drugs. ? You live with someone who has hepatitis B. ? You have had sex with someone who has hepatitis B. ? You get hemodialysis treatment. ? You take certain medicines for conditions, including cancer, organ transplantation, and autoimmune conditions.  Hepatitis C  Blood testing is recommended for: ? Everyone born from [birth year] through 1964. ? Anyone with known risk factors for hepatitis C.  Sexually transmitted infections (STIs)  You should be screened for sexually transmitted infections (STIs) including gonorrhea and chlamydia if: ? You are sexually active and are younger than 36 years of age. ? You are older than 36 years of age and your health care provider tells you that you are at risk for this type of infection. ? Your sexual activity has changed since you were last screened and you are at an increased risk for chlamydia or gonorrhea. Ask your health care provider if you are at risk.  If you do not have HIV, but are at risk, it may be recommended that you take a prescription medicine daily to prevent HIV infection. This is called pre-exposure prophylaxis  (PrEP). You are considered at risk if: ? You are sexually active and do not regularly use condoms or know the HIV status of your partner(s). ? You take drugs by injection. ? You are sexually active with a partner who has HIV.  Talk with your health care provider about whether you are at high risk of being infected with HIV. If you choose to begin PrEP, you should first be tested for HIV. You should then be tested  every 3 months for as long as you are taking PrEP. Pregnancy  If you are premenopausal and you may become pregnant, ask your health care provider about preconception counseling.  If you may become pregnant, take 400 to 800 micrograms (mcg) of folic acid every day.  If you want to prevent pregnancy, talk to your health care provider about birth control (contraception). Osteoporosis and menopause  Osteoporosis is a disease in which the bones lose minerals and strength with aging. This can result in serious bone fractures. Your risk for osteoporosis can be identified using a bone density scan.  If you are 66 years of age or older, or if you are at risk for osteoporosis and fractures, ask your health care provider if you should be screened.  Ask your health care provider whether you should take a calcium or vitamin D supplement to lower your risk for osteoporosis.  Menopause may have certain physical symptoms and risks.  Hormone replacement therapy may reduce some of these symptoms and risks. Talk to your health care provider about whether hormone replacement therapy is right for you. Follow these instructions at home:  Schedule regular health, dental, and eye exams.  Stay current with your immunizations.  Do not use any tobacco products including cigarettes, chewing tobacco, or electronic cigarettes.  If you are pregnant, do not drink alcohol.  If you are breastfeeding, limit how much and how often you drink alcohol.  Limit alcohol intake to no more than 1 drink per day for  nonpregnant women. One drink equals 12 ounces of beer, 5 ounces of wine, or 1 ounces of hard liquor.  Do not use street drugs.  Do not share needles.  Ask your health care provider for help if you need support or information about quitting drugs.  Tell your health care provider if you often feel depressed.  Tell your health care provider if you have ever been abused or do not feel safe at home. This information is not intended to replace advice given to you by your health care provider. Make sure you discuss any questions you have with your health care provider. Document Released: 03/09/2011 Document Revised: 01/30/2016 Document Reviewed: 05/28/2015 Elsevier Interactive Patient Education  [employer].

## 2017-12-21 NOTE — Progress Notes (Signed)
In care everywhere

## 2017-12-22 ENCOUNTER — Telehealth: Payer: Self-pay

## 2017-12-22 DIAGNOSIS — Z0000 Encounter for general adult medical examination without abnormal findings: Secondary | ICD-10-CM | POA: Insufficient documentation

## 2017-12-22 MED ORDER — NITROFURANTOIN MONOHYD MACRO 100 MG PO CAPS: 100.0000 mg | ORAL_CAPSULE | Freq: Two times a day (BID) | ORAL | 0 refills | Status: DC

## 2017-12-22 NOTE — Telephone Encounter (Signed)
Patient informed. 

## 2017-12-22 NOTE — Assessment & Plan Note (Signed)
Checking HIV screening, flu shot yearly. Tetanus up to date. Counseled about sun safety and mole surveillance. Given screening recommendations.

## 2017-12-22 NOTE — Telephone Encounter (Signed)
Sent in

## 2017-12-22 NOTE — Telephone Encounter (Signed)
Patient is requesting the macrobid to be sent in instead of the bactrim as it did not work for her the last time she had an infection?

## 2017-12-30 ENCOUNTER — Other Ambulatory Visit (INDEPENDENT_AMBULATORY_CARE_PROVIDER_SITE_OTHER): Payer: 59

## 2017-12-30 DIAGNOSIS — Z0000 Encounter for general adult medical examination without abnormal findings: Secondary | ICD-10-CM | POA: Diagnosis not present

## 2017-12-30 LAB — LIPID PANEL
CHOL/HDL RATIO: 2
Cholesterol: 113 mg/dL (ref 0–200)
HDL: 47.4 mg/dL (ref >39.00)
LDL Cholesterol: 55 mg/dL (ref 0–99)
NONHDL: 65.37
TRIGLYCERIDES: 52 mg/dL (ref 0.0–149.0)
VLDL: 10.4 mg/dL (ref 0.0–40.0)

## 2017-12-30 LAB — COMPREHENSIVE METABOLIC PANEL
ALT: 4 U/L (ref 0–35)
AST: 10 U/L (ref 0–37)
Albumin: 3.9 g/dL (ref 3.5–5.2)
Alkaline Phosphatase: 57 U/L (ref 39–117)
BUN: 9 mg/dL (ref 6–23)
CO2: 27 meq/L (ref 19–32)
Calcium: 9 mg/dL (ref 8.4–10.5)
Chloride: 104 mEq/L (ref 96–112)
Creatinine, Ser: 0.96 mg/dL (ref 0.40–1.20)
GFR: 84.43 mL/min (ref >60.00)
GLUCOSE: 77 mg/dL (ref 70–99)
POTASSIUM: 3.9 meq/L (ref 3.5–5.1)
Sodium: 138 mEq/L (ref 135–145)
Total Bilirubin: 0.5 mg/dL (ref 0.2–1.2)
Total Protein: 7.2 g/dL (ref 6.0–8.3)

## 2017-12-30 LAB — CBC
HEMATOCRIT: 33.3 % — AB (ref 36.0–46.0)
HEMOGLOBIN: 11 g/dL — AB (ref 12.0–15.0)
MCHC: 33.1 g/dL (ref 30.0–36.0)
MCV: 83.3 fl (ref 78.0–100.0)
Platelets: 331 10*3/uL (ref 150.0–400.0)
RBC: 4 Mil/uL (ref 3.87–5.11)
RDW: 12.9 % (ref 11.5–15.5)
WBC: 6.2 10*3/uL (ref 4.0–10.5)

## 2017-12-30 LAB — VITAMIN B12: Vitamin B-12: 97 pg/mL — ABNORMAL LOW (ref 211–911)

## 2017-12-30 LAB — TSH: TSH: 0.93 u[IU]/mL (ref 0.35–4.50)

## 2017-12-30 LAB — VITAMIN D 25 HYDROXY (VIT D DEFICIENCY, FRACTURES): VITD: 20.48 ng/mL — ABNORMAL LOW (ref 30.00–100.00)

## 2017-12-31 LAB — HIV ANTIBODY (ROUTINE TESTING W REFLEX): HIV: NONREACTIVE

## 2018-01-14 ENCOUNTER — Encounter: Payer: Self-pay | Admitting: Podiatry

## 2018-01-14 ENCOUNTER — Ambulatory Visit (INDEPENDENT_AMBULATORY_CARE_PROVIDER_SITE_OTHER): Payer: 59

## 2018-01-14 ENCOUNTER — Other Ambulatory Visit: Payer: Self-pay | Admitting: Podiatry

## 2018-01-14 ENCOUNTER — Ambulatory Visit: Payer: 59 | Admitting: Podiatry

## 2018-01-14 ENCOUNTER — Other Ambulatory Visit: Payer: Self-pay

## 2018-01-14 DIAGNOSIS — R600 Localized edema: Secondary | ICD-10-CM | POA: Diagnosis not present

## 2018-01-14 DIAGNOSIS — M1A0790 Idiopathic chronic gout, unspecified ankle and foot, without tophus (tophi): Secondary | ICD-10-CM

## 2018-01-14 DIAGNOSIS — M779 Enthesopathy, unspecified: Secondary | ICD-10-CM | POA: Diagnosis not present

## 2018-01-14 DIAGNOSIS — M79671 Pain in right foot: Secondary | ICD-10-CM | POA: Diagnosis not present

## 2018-01-14 DIAGNOSIS — M79672 Pain in left foot: Principal | ICD-10-CM

## 2018-01-14 MED ORDER — PREDNISONE 10 MG PO TABS
ORAL_TABLET | ORAL | 0 refills | Status: DC
Start: 2018-01-14 — End: 2018-12-07

## 2018-01-14 MED ORDER — TRIAMCINOLONE ACETONIDE 10 MG/ML IJ SUSP
10.0000 mg | Freq: Once | INTRAMUSCULAR | Status: AC
  Administered 2018-01-14: 10 mg

## 2018-01-14 NOTE — Progress Notes (Signed)
   Subjective:    Patient ID: Sylvia Fitzpatrick, female    DOB: [birth year], 37 y.o.   MRN: [id number]  HPI    Review of Systems  All other systems reviewed and are negative.      Objective:   Physical Exam        Assessment & Plan:

## 2018-01-17 ENCOUNTER — Other Ambulatory Visit: Payer: Self-pay

## 2018-01-17 ENCOUNTER — Encounter (HOSPITAL_COMMUNITY): Payer: Self-pay | Admitting: Emergency Medicine

## 2018-01-17 ENCOUNTER — Emergency Department (HOSPITAL_COMMUNITY)
Admission: EM | Admit: 2018-01-17 | Discharge: 2018-01-18 | Disposition: A | Discharge status: Home / Self Care | Payer: 59 | Attending: Emergency Medicine | Admitting: Emergency Medicine

## 2018-01-17 DIAGNOSIS — R1013 Epigastric pain: Secondary | ICD-10-CM

## 2018-01-17 DIAGNOSIS — Z79899 Other long term (current) drug therapy: Secondary | ICD-10-CM | POA: Diagnosis not present

## 2018-01-17 DIAGNOSIS — R1010 Upper abdominal pain, unspecified: Secondary | ICD-10-CM | POA: Insufficient documentation

## 2018-01-17 DIAGNOSIS — N3090 Cystitis, unspecified without hematuria: Secondary | ICD-10-CM

## 2018-01-17 DIAGNOSIS — R112 Nausea with vomiting, unspecified: Secondary | ICD-10-CM | POA: Diagnosis not present

## 2018-01-17 NOTE — ED Triage Notes (Signed)
Pt has had intermittent mid epigastric aching pain that feels like a "knot"  non radiating, some nausea/vomiting. No diarrhea.  Has been intermittent the past month but constant since Saturday. Has tried laxative and gas pills with no relief.

## 2018-01-17 NOTE — Progress Notes (Signed)
Subjective:   Patient ID: Sylvia Fitzpatrick, female   DOB: 37 y.o.   MRN: [id number]   HPI Patient presents stating that her feet are killing her and that her ankles really bother her nuclear hard to walk.  Patient states this is been an ongoing issue and has gotten worse over the last couple months.  Patient also has flatfoot deformity and currently does not smoke and likes to be active   Review of Systems  All other systems reviewed and are negative.       Objective:  Physical Exam  Constitutional: She appears well-developed and well-nourished.  Cardiovascular: Intact distal pulses.  Pulmonary/Chest: Effort normal.  Musculoskeletal: Normal range of motion.  Neurological: She is alert.  Skin: Skin is warm.  Nursing note and vitals reviewed.   Neurovascular status intact muscle strength adequate range of motion within normal limits with patient found to have exquisite discomfort sinus tarsi bilateral with inflammation fluid buildup moderate depression of the arch with excessive eversion and moderate swelling and forefoot pain also noted bilateral.  Patient has good digital perfusion well oriented x3     Assessment:  Some form of inflammatory condition and cannot rule out any systemic causes of inflammation or other systemic pathology     Plan:  H&P x-rays reviewed all conditions discussed.  At this time I injected the sinus tarsi bilateral 3 mg Kenalog 5 mg Xylocaine applied fascial brace bilateral to limit motion and placed on a 12-day steroid Dosepak.  Gave instructions for reduced activity and reappoint to recheck in the next 2 to 3 weeks  X-rays indicate that there is no indications of advanced arthritis or stress fracture formation

## 2018-01-18 ENCOUNTER — Emergency Department (HOSPITAL_COMMUNITY): Payer: 59

## 2018-01-18 DIAGNOSIS — R1010 Upper abdominal pain, unspecified: Secondary | ICD-10-CM | POA: Diagnosis not present

## 2018-01-18 DIAGNOSIS — Z79899 Other long term (current) drug therapy: Secondary | ICD-10-CM | POA: Diagnosis not present

## 2018-01-18 LAB — URINALYSIS, ROUTINE W REFLEX MICROSCOPIC
BILIRUBIN URINE: NEGATIVE
Glucose, UA: NEGATIVE mg/dL
Ketones, ur: NEGATIVE mg/dL
NITRITE: NEGATIVE
PROTEIN: NEGATIVE mg/dL
Specific Gravity, Urine: 1.009 (ref 1.005–1.030)
pH: 6 (ref 5.0–8.0)

## 2018-01-18 LAB — COMPREHENSIVE METABOLIC PANEL
ALT: 11 U/L — ABNORMAL LOW (ref 14–54)
ANION GAP: 9 (ref 5–15)
AST: 16 U/L (ref 15–41)
Albumin: 3.9 g/dL (ref 3.5–5.0)
Alkaline Phosphatase: 55 U/L (ref 38–126)
BUN: 7 mg/dL (ref 6–20)
CHLORIDE: 99 mmol/L — AB (ref 101–111)
CO2: 28 mmol/L (ref 22–32)
Calcium: 9.4 mg/dL (ref 8.9–10.3)
Creatinine, Ser: 1.01 mg/dL — ABNORMAL HIGH (ref 0.44–1.00)
GFR calc Af Amer: >60 mL/min (ref >60)
GFR calc non Af Amer: >60 mL/min (ref >60)
GLUCOSE: 102 mg/dL — AB (ref 65–99)
POTASSIUM: 3.9 mmol/L (ref 3.5–5.1)
SODIUM: 136 mmol/L (ref 135–145)
TOTAL PROTEIN: 7.7 g/dL (ref 6.5–8.1)
Total Bilirubin: 0.8 mg/dL (ref 0.3–1.2)

## 2018-01-18 LAB — I-STAT BETA HCG BLOOD, ED (MC, WL, AP ONLY)

## 2018-01-18 LAB — CBC
HEMATOCRIT: 36.6 % (ref 36.0–46.0)
HEMOGLOBIN: 12.2 g/dL (ref 12.0–15.0)
MCH: 27.2 pg (ref 26.0–34.0)
MCHC: 33.3 g/dL (ref 30.0–36.0)
MCV: 81.7 fL (ref 78.0–100.0)
Platelets: 349 10*3/uL (ref 150–400)
RBC: 4.48 MIL/uL (ref 3.87–5.11)
RDW: 13.2 % (ref 11.5–15.5)
WBC: 12.9 10*3/uL — ABNORMAL HIGH (ref 4.0–10.5)

## 2018-01-18 LAB — LIPASE, BLOOD: LIPASE: 30 U/L (ref 11–51)

## 2018-01-18 MED ORDER — ONDANSETRON HCL 4 MG PO TABS: 4.0000 mg | ORAL_TABLET | Freq: Three times a day (TID) | ORAL | 0 refills | Status: DC | PRN

## 2018-01-18 MED ORDER — SODIUM CHLORIDE 0.9 % IV BOLUS
1000.0000 mL | Freq: Once | INTRAVENOUS | Status: AC
  Administered 2018-01-18: 1000 mL via INTRAVENOUS

## 2018-01-18 MED ORDER — OMEPRAZOLE 20 MG PO CPDR: 20.0000 mg | DELAYED_RELEASE_CAPSULE | Freq: Every day | ORAL | 0 refills | Status: DC

## 2018-01-18 MED ORDER — ONDANSETRON HCL 4 MG/2ML IJ SOLN
4.0000 mg | Freq: Once | INTRAMUSCULAR | Status: AC
  Administered 2018-01-18: 4 mg via INTRAVENOUS
  Filled 2018-01-18: qty 2

## 2018-01-18 MED ORDER — CEPHALEXIN 500 MG PO CAPS: 500.0000 mg | ORAL_CAPSULE | Freq: Two times a day (BID) | ORAL | 0 refills | Status: DC

## 2018-01-18 MED ORDER — GI COCKTAIL ~~LOC~~
30.0000 mL | Freq: Once | ORAL | Status: AC
  Administered 2018-01-18: 30 mL via ORAL
  Filled 2018-01-18: qty 30

## 2018-01-18 MED ORDER — MORPHINE SULFATE (PF) 4 MG/ML IV SOLN
4.0000 mg | Freq: Once | INTRAVENOUS | Status: AC
  Administered 2018-01-18: 4 mg via INTRAVENOUS
  Filled 2018-01-18: qty 1

## 2018-01-18 MED ORDER — SUCRALFATE 1 GM/10ML PO SUSP: 1.0000 g | Freq: Three times a day (TID) | ORAL | 0 refills | Status: DC

## 2018-01-18 NOTE — ED Notes (Signed)
Pt departed in NAD, refused use of wheelchair.  

## 2018-01-18 NOTE — Discharge Instructions (Addendum)
Please read and follow all provided instructions You have been seen today for your complaint of: Epigastric abdominal pain Your lab work: Was overall reassuring.  Your urinalysis did show signs of a UTI.  I prescribed you Keflex for your symptoms. Please take all of your antibiotics until finished!   You may develop abdominal discomfort or diarrhea from the antibiotic.  You may help offset this with probiotics which you can buy or get in yogurt. Do not eat or take the probiotics until 2 hours after your antibiotic. Do not take your medicine if develop an itchy rash, swelling in your mouth or lips, or difficulty breathing.  Your imaging: Your right upper quadrant ultrasound did not show evidence of gallbladder pathology. Vital signs: See below  Abdominal Pain  Your exam might not show the exact reason you have abdominal pain. Since there are many different causes of abdominal pain, another checkup and more tests may be needed. It is very important to follow up for lasting (persistent) or worsening symptoms. A possible cause of abdominal pain in any person who still has his or her appendix is acute appendicitis. Appendicitis is often hard to diagnose. Normal blood tests, urine tests, ultrasound, and CT scans do not completely rule out early appendicitis or other causes of abdominal pain. Sometimes, only the changes that happen over time will allow appendicitis and other causes of abdominal pain to be determined. Other potential problems that may require surgery may also take time to become more apparent. Because of this, it is important that you follow all of the instructions below.   HOME CARE INSTRUCTIONS  Do not take laxatives unless directed by your caregiver. Rest as much as possible.  Do not eat solid food until your pain is gone: A diet of water, weak decaffeinated tea, broth or bouillon, gelatin, oral rehydration solutions (ORS), frozen ice pops, or ice chips may be helpful.  When pain is gone:  Start a light diet (dry toast, crackers, applesauce, or white rice). Increase the diet slowly as long as it does not bother you. Eat no dairy products (including cheese and eggs) and no spicy, fatty, fried, or high-fiber foods.  Use no alcohol, caffeine, or cigarettes.  Take your regular medicines unless your caregiver told you not to.  Take any prescribed medicine as directed.   SEEK IMMEDIATE MEDICAL CARE IF:  The pain does not go away.  You have a fever >101 that does not go down with medication. You keep throwing up (vomiting) or cannot drink liquids.  You have shaking chills (rigors) The pain becomes localized (Pain in the right side could possibly be appendicitis. In an adult, pain in the left lower portion of the abdomen could be colitis or diverticulitis). You pass bloody or black tarry stools.  There is bright red blood in the stool.  There is blood in your vomit. Your bowel movements stop (become blocked) or you cannot pass gas.  The constipation stays for more than 4 days.  You have bloody, frequent, or painful urination.  You have yellow discoloration in the skin or whites of the eyes.  Your stomach becomes bloated or bigger.  You have dizziness or fainting.  You have chest or back pain. You have rectal pain.  You do not seem to be getting better.  You have any questions or concerns.   Your vital signs today were: BP 109/75 (BP Location: Right Arm)    Pulse 65    Temp 98.4 F (36.9 C) (Oral)  Resp 20    SpO2 100%  If your blood pressure (bp) was elevated above 135/85 this visit, please have this repeated by your doctor within one month.

## 2018-01-18 NOTE — ED Provider Notes (Signed)
[facility] EMERGENCY DEPARTMENT Provider Note   CSN: [id number] Arrival date & time: 01/17/18  2227     History   Chief Complaint Chief Complaint  Patient presents with  . Abdominal Pain    HPI Sylvia Fitzpatrick is a 37 y.o. female with no significant past medical history presents emergency department today for epigastric abdominal pain that has been intermittent over the last several weeks.  Patient states that she has been having intermittent bouts of epigastric abdominal pain that she describes a knot in her stomach that does not radiate and occurs typically in balance of 30 minutes or less.  She states that when she has that she usually has accompanying nausea as well as occasional nonbilious, nonbloody emesis.  She denies the pain being associated or related to food.  She does note that preceding this she recently started prednisone as well as started taking ibuprofen more frequently.  Patient notes she has not taken anything for symptoms.  Nothing makes her symptoms better or worse.  Patient denies any fever, chills, chest pain, shortness breath, cough, hemoptysis, lower abdominal pain, flank pain, dysuria, hematuria, diarrhea, melena or hematochezia.  Patient does report some increased urinary frequency.  No previous abdominal surgeries.  She is still passing gas.  Last bowel movement today.   HPI  Past Medical History:  Diagnosis Date  . Headache(784.0)    Prior hx migraines  . No pertinent past medical history   . PONV (postoperative nausea and vomiting)    n/v after iv anesthetic    Patient Active Problem List   Diagnosis Date Noted  . Routine general medical examination at a health care facility 12/22/2017  . Menorrhagia 12/13/2017  . Migraine 08/18/2016    Past Surgical History:  Procedure Laterality Date  . MULTIPLE TOOTH EXTRACTIONS    . TUBAL LIGATION Bilateral 02/07/2014   Procedure: POST PARTUM TUBAL LIGATION;  Surgeon: Donnamae Jude, MD;   Location: [facility];  Service: Gynecology;  Laterality: Bilateral;     OB History    Gravida  3   Para  3   Term  3   Preterm  0   AB  0   Living  3     SAB  0   TAB  0   Ectopic  0   Multiple  0   Live Births  3            Home Medications    Prior to Admission medications   Medication Sig Start Date End Date Taking? Authorizing Provider  Cholecalciferol (VITAMIN D PO) Take 1 tablet by mouth daily.    [provider]  Cyanocobalamin (VITAMIN B-12 PO) Take 1 tablet by mouth daily.    [provider]  levonorgestrel-ethinyl estradiol (AVIANE,ALESSE,LESSINA) 0.1-20 MG-MCG tablet  12/21/17   [provider]  medroxyPROGESTERone Acetate 150 MG/ML SUSY medroxyprogesterone 150 mg/mL intramuscular syringe  Inject 1 mL every 3 months by intramuscular route.    [provider]  methocarbamol (ROBAXIN) 500 MG tablet methocarbamol 500 mg tablet    [provider]  Multiple Vitamins-Minerals (MULTIVITAMIN ADULT PO) Take 1 tablet by mouth daily.    [provider]  naproxen (NAPROSYN) 500 MG tablet naproxen 500 mg tablet    [provider]  nitrofurantoin, macrocrystal-monohydrate, (MACROBID) 100 MG capsule Take 1 capsule (100 mg total) by mouth 2 (two) times daily. 12/22/17   Hoyt Koch, MD  predniSONE (DELTASONE) 10 MG tablet 12 day tapering  dose 01/14/18   Wallene Huh, DPM  traMADol (ULTRAM) 50 MG tablet tramadol 50 mg tablet    [provider]    Family History No family history on file.  Social History Social History   Tobacco Use  . Smoking status: Never Smoker  . Smokeless tobacco: Never Used  Substance Use Topics  . Alcohol use: No  . Drug use: No     Allergies   Patient has no known allergies.   Review of Systems Review of Systems  All other systems reviewed and are negative.    Physical Exam Updated Vital Signs BP 109/75 (BP Location: Right Arm)   Pulse 65    Temp 98.4 F (36.9 C) (Oral)   Resp 20   SpO2 100%   Physical Exam  Constitutional: She appears well-developed and well-nourished.  HENT:  Head: Normocephalic and atraumatic.  Right Ear: External ear normal.  Left Ear: External ear normal.  Nose: Nose normal.  Mouth/Throat: Uvula is midline, oropharynx is clear and moist and mucous membranes are normal. No tonsillar exudate.  Eyes: Pupils are equal, round, and reactive to light. Right eye exhibits no discharge. Left eye exhibits no discharge. No scleral icterus.  Neck: Trachea normal. Neck supple. No spinous process tenderness present. No neck rigidity. Normal range of motion present.  Cardiovascular: Normal rate, regular rhythm and intact distal pulses.  No murmur heard. Pulses:      Radial pulses are 2+ on the right side, and 2+ on the left side.       Dorsalis pedis pulses are 2+ on the right side, and 2+ on the left side.       Posterior tibial pulses are 2+ on the right side, and 2+ on the left side.  No lower extremity swelling or edema. Calves symmetric in size bilaterally.  Pulmonary/Chest: Effort normal and breath sounds normal. She exhibits no tenderness.  Abdominal: Soft. Bowel sounds are normal. She exhibits no distension. There is tenderness in the epigastric area. There is no rigidity, no rebound, no guarding and no CVA tenderness.  Musculoskeletal: She exhibits no edema.  Lymphadenopathy:    She has no cervical adenopathy.  Neurological: She is alert.  Skin: Skin is warm and dry. No rash noted. She is not diaphoretic.  Psychiatric: She has a normal mood and affect.  Nursing note and vitals reviewed.    ED Treatments / Results  Labs (all labs ordered are listed, but only abnormal results are displayed) Labs Reviewed  COMPREHENSIVE METABOLIC PANEL - Abnormal; Notable for the following components:      Result Value   Chloride 99 (*)    Glucose, Bld 102 (*)    Creatinine, Ser 1.01 (*)    ALT 11 (*)    All other  components within normal limits  CBC - Abnormal; Notable for the following components:   WBC 12.9 (*)    All other components within normal limits  URINALYSIS, ROUTINE W REFLEX MICROSCOPIC - Abnormal; Notable for the following components:   APPearance HAZY (*)    Hgb urine dipstick SMALL (*)    Leukocytes, UA LARGE (*)    WBC, UA >50 (*)    Bacteria, UA RARE (*)    All other components within normal limits  LIPASE, BLOOD  I-STAT BETA HCG BLOOD, ED ([facility], [facility], [facility] ONLY)    EKG None  Radiology US Abdomen Limited Ruq  Result Date: 01/18/2018 CLINICAL DATA:  37 y/o  F; upper abdominal pain. EXAM: ULTRASOUND  ABDOMEN LIMITED RIGHT UPPER QUADRANT COMPARISON:  None. FINDINGS: Gallbladder: No gallstones or wall thickening visualized. No sonographic Murphy sign noted by sonographer. Common bile duct: Diameter: 2 mm Liver: No focal lesion identified. Within normal limits in parenchymal echogenicity. Portal vein is patent on color Doppler imaging with normal direction of blood flow towards the liver. IMPRESSION: No acute process identified. Unremarkable right upper quadrant ultrasound. Electronically Signed   By: Kristine Garbe M.D.   On: 01/18/2018 06:10    Procedures Procedures (including critical care time)  Medications Ordered in ED Medications  sodium chloride 0.9 % bolus 1,000 mL (has no administration in time range)  ondansetron (ZOFRAN) injection 4 mg (has no administration in time range)  morphine 4 MG/ML injection 4 mg (has no administration in time range)  gi cocktail (Maalox,Lidocaine,Donnatal) (has no administration in time range)     Initial Impression / Assessment and Plan / ED Course  I have reviewed the triage vital signs and the nursing notes.  Pertinent labs & imaging results that were available during my care of the patient were reviewed by me and considered in my medical decision making (see chart for details).     37 year old female with a history of  intermittent epigastric abdominal pain since starting prednisone as well as ibuprofen.  Patient does note some associated nausea and vomiting.  Patient vital signs are reassuring.  She is without fever, tachycardia, tachypnea, hypoxia or hypotension.  She is non-ill and nonseptic appearing.  Patient does have tenderness palpation in the epigastric region without any peritoneal signs.  Lab work is overall reassuring.  Patient's pregnancy test negative.  Lipase within normal limits.  No significant electrolyte derangements.  No anion gap acidosis.  No acute kidney injury.  No significant elevation of LFTs.  Mild leukocytosis.  No anemia.  Right upper quadrant ultrasound without any acute process identified.  Suspect patient's symptoms are likely related to gastritis.  She was given GI cocktail in department with relief of her symptoms.  She is tolerating p.o. fluids.  Nausea relieved after Zofran.  Will discharge patient home on omeprazole and Carafate.  Patient also reports increased urinary frequency.  UA is questionable for UTI.  Given patient's location of pain I have a low suspicion for kidney stone at this time.  Will start on Keflex.  Recommended that the patient avoid NSAIDs as this is likely exacerbating her symptoms. I advised the patient to follow-up with PCP this week. Specific return precautions discussed. Time was given for all questions to be answered. The patient verbalized understanding and agreement with plan. The patient appears safe for discharge home.  Final Clinical Impressions(s) / ED Diagnoses   Final diagnoses:  Upper abdominal pain    ED Discharge Orders        Ordered    sucralfate (CARAFATE) 1 GM/10ML suspension  3 times daily with meals & bedtime     01/18/18 0629    omeprazole (PRILOSEC) 20 MG capsule  Daily     01/18/18 0629    ondansetron (ZOFRAN) 4 MG tablet  Every 8 hours PRN     01/18/18 0629    cephALEXin (KEFLEX) 500 MG capsule  2 times daily     01/18/18 0865         Jillyn Ledger, PA-C 01/18/18 0630    Veryl Speak, MD 01/19/18 [phone]

## 2018-01-18 NOTE — ED Notes (Signed)
No answer for vital recheck 

## 2018-01-19 ENCOUNTER — Ambulatory Visit (INDEPENDENT_AMBULATORY_CARE_PROVIDER_SITE_OTHER): Payer: 59 | Admitting: Internal Medicine

## 2018-01-19 ENCOUNTER — Encounter: Payer: Self-pay | Admitting: Internal Medicine

## 2018-01-19 DIAGNOSIS — M79671 Pain in right foot: Secondary | ICD-10-CM

## 2018-01-19 DIAGNOSIS — M79672 Pain in left foot: Secondary | ICD-10-CM | POA: Diagnosis not present

## 2018-01-19 DIAGNOSIS — R8271 Bacteriuria: Secondary | ICD-10-CM | POA: Insufficient documentation

## 2018-01-19 DIAGNOSIS — R1013 Epigastric pain: Secondary | ICD-10-CM | POA: Diagnosis not present

## 2018-01-19 DIAGNOSIS — M79673 Pain in unspecified foot: Secondary | ICD-10-CM | POA: Insufficient documentation

## 2018-01-19 NOTE — Assessment & Plan Note (Signed)
Take omeprazole for 2 weeks and then as needed. Increase diet as tolerated. She has not benefited from carafate and advised that she can try again if she wants but does not need to take for healing.

## 2018-01-19 NOTE — Assessment & Plan Note (Signed)
Taking prednisone and nsaids for this. Asked her to take omeprazole while on combination. Can use omeprazole prn for pain while on nsaids. She is hoping that the podiatrist can solve her foot pain problems so she will not need to be on this long term.

## 2018-01-19 NOTE — Patient Instructions (Signed)
Take the omeprazole for 2 weeks then take only as needed.   The liquid carafate is just for symptoms so if not helping do not take.   Do not take the keflex (cephalexin).   The zofran (ondansetron) is as needed for nausea only.   It is okay to resume taking the prednisone.

## 2018-01-19 NOTE — Progress Notes (Signed)
   Subjective:    Patient ID: Sylvia Fitzpatrick, female    DOB: May 19, 1981, 37 y.o.   MRN: [id number]  HPI The patient is a 37 YO female coming in for ER follow up (in for epigastric stomach pain, given GI cocktail and pain improved, also found to have UTI and treated, she denies prior symptoms of uti). She had been taking more NSAIDs for foot pain (podiatry is working on and checking for auto-immune disease). She is taking the carafate today and it is gross and she did not feel it helped with the burning. She is eating banana for breakfast but not eating well. She has missed a lot of meals since Sunday. Weight is stable. Denies lightheadedness.   PMH, [facility], social history reviewed and updated.   Review of Systems  Constitutional: Positive for appetite change. Negative for activity change, chills, fatigue, fever and unexpected weight change.  HENT: Negative.   Eyes: Negative.   Respiratory: Negative for cough, chest tightness and shortness of breath.   Cardiovascular: Negative for chest pain, palpitations and leg swelling.  Gastrointestinal: Positive for abdominal pain. Negative for abdominal distention, constipation, diarrhea, nausea and vomiting.  Musculoskeletal: Negative.   Skin: Negative.   Neurological: Negative.   Psychiatric/Behavioral: Negative.       Objective:   Physical Exam  Constitutional: She is oriented to person, place, and time. She appears well-developed and well-nourished.  HENT:  Head: Normocephalic and atraumatic.  Eyes: EOM are normal.  Neck: Normal range of motion.  Cardiovascular: Normal rate and regular rhythm.  Pulmonary/Chest: Effort normal and breath sounds normal. No respiratory distress. She has no wheezes. She has no rales.  Abdominal: Soft. Bowel sounds are normal. She exhibits no distension. There is no tenderness. There is no rebound.  Musculoskeletal: She exhibits no edema.  Neurological: She is alert and oriented to person, place, and time.  Coordination normal.  Skin: Skin is warm and dry.  Psychiatric: She has a normal mood and affect.   Vitals:   01/19/18 0903  BP: 110/74  Pulse: 61  Temp: 97.9 F (36.6 C)  TempSrc: Oral  SpO2: 99%  Weight: 171 lb (77.6 kg)  Height: 5\' 9"  (1.753 m)      Assessment & Plan:

## 2018-01-19 NOTE — Assessment & Plan Note (Signed)
Do not think she had true UTI as she was not having any symptoms. Will ask her to stop taking keflex and not finish course.

## 2018-01-28 ENCOUNTER — Ambulatory Visit: Payer: 59 | Admitting: Podiatry

## 2018-02-17 DIAGNOSIS — A5900 Urogenital trichomoniasis, unspecified: Secondary | ICD-10-CM | POA: Diagnosis not present

## 2018-02-17 DIAGNOSIS — Z113 Encounter for screening for infections with a predominantly sexual mode of transmission: Secondary | ICD-10-CM | POA: Diagnosis not present

## 2018-02-17 DIAGNOSIS — N898 Other specified noninflammatory disorders of vagina: Secondary | ICD-10-CM | POA: Diagnosis not present

## 2018-02-17 DIAGNOSIS — R350 Frequency of micturition: Secondary | ICD-10-CM | POA: Diagnosis not present

## 2018-03-24 ENCOUNTER — Other Ambulatory Visit: Payer: 59

## 2018-04-01 LAB — ANA, IFA COMPREHENSIVE PANEL
Anti Nuclear Antibody(ANA): POSITIVE — AB
DS DNA AB: 2 [IU]/mL
ENA SM AB SER-ACNC: NEGATIVE AI
SCLERODERMA (SCL-70) (ENA) ANTIBODY, IGG: NEGATIVE AI
SM/RNP: <1 AI
SSA (Ro) (ENA) Antibody, IgG: <1 AI
SSB (La) (ENA) Antibody, IgG: <1 AI

## 2018-04-01 LAB — C-REACTIVE PROTEIN: CRP: 0.9 mg/L (ref <8.0)

## 2018-04-01 LAB — URIC ACID: Uric Acid, Serum: 4.4 mg/dL (ref 2.5–7.0)

## 2018-04-01 LAB — SEDIMENTATION RATE: SED RATE: 9 mm/h (ref 0–20)

## 2018-04-01 LAB — ANTI-NUCLEAR AB-TITER (ANA TITER): ANA Titer 1: 1:40 {titer} — ABNORMAL HIGH

## 2018-04-01 LAB — RHEUMATOID FACTOR: Rheumatoid fact SerPl-aCnc: <14 IU/mL (ref <14)

## 2018-04-12 DIAGNOSIS — A5900 Urogenital trichomoniasis, unspecified: Secondary | ICD-10-CM | POA: Diagnosis not present

## 2018-04-12 DIAGNOSIS — N898 Other specified noninflammatory disorders of vagina: Secondary | ICD-10-CM | POA: Diagnosis not present

## 2018-04-14 ENCOUNTER — Telehealth: Payer: Self-pay | Admitting: Podiatry

## 2018-04-14 NOTE — Telephone Encounter (Signed)
Left message apologizing for her not getting the lab results, but Dr. Paulla Dolly had wanted her make an appt to come in to discuss the results, and to make an appt.

## 2018-04-14 NOTE — Telephone Encounter (Signed)
Pt. Never received update on blood work that was taken.

## 2018-04-19 NOTE — Telephone Encounter (Signed)
I think her blood work is fine but if I can get a copy I will review. I thought she was still having pain and that was why I was seeing her. If you can let me see a copy of her blood work tomorrow

## 2018-12-07 ENCOUNTER — Ambulatory Visit (INDEPENDENT_AMBULATORY_CARE_PROVIDER_SITE_OTHER): Payer: 59 | Admitting: Internal Medicine

## 2018-12-07 ENCOUNTER — Other Ambulatory Visit (INDEPENDENT_AMBULATORY_CARE_PROVIDER_SITE_OTHER): Payer: 59

## 2018-12-07 ENCOUNTER — Encounter: Payer: Self-pay | Admitting: Internal Medicine

## 2018-12-07 DIAGNOSIS — M2550 Pain in unspecified joint: Secondary | ICD-10-CM | POA: Diagnosis not present

## 2018-12-07 DIAGNOSIS — N921 Excessive and frequent menstruation with irregular cycle: Secondary | ICD-10-CM | POA: Diagnosis not present

## 2018-12-07 DIAGNOSIS — E538 Deficiency of other specified B group vitamins: Secondary | ICD-10-CM | POA: Diagnosis not present

## 2018-12-07 LAB — CBC
HCT: 34.8 % — ABNORMAL LOW (ref 36.0–46.0)
Hemoglobin: 11.5 g/dL — ABNORMAL LOW (ref 12.0–15.0)
MCHC: 33 g/dL (ref 30.0–36.0)
MCV: 83.4 fl (ref 78.0–100.0)
Platelets: 282 10*3/uL (ref 150.0–400.0)
RBC: 4.17 Mil/uL (ref 3.87–5.11)
RDW: 13.6 % (ref 11.5–15.5)
WBC: 5.6 10*3/uL (ref 4.0–10.5)

## 2018-12-07 LAB — URINALYSIS, ROUTINE W REFLEX MICROSCOPIC
Bilirubin Urine: NEGATIVE
Ketones, ur: NEGATIVE
Leukocytes,Ua: NEGATIVE
Nitrite: NEGATIVE
Specific Gravity, Urine: 1.02 (ref 1.000–1.030)
Total Protein, Urine: NEGATIVE
Urine Glucose: NEGATIVE
Urobilinogen, UA: 0.2 (ref 0.0–1.0)
pH: 6.5 (ref 5.0–8.0)

## 2018-12-07 LAB — TSH: TSH: 0.7 u[IU]/mL (ref 0.35–4.50)

## 2018-12-07 LAB — COMPREHENSIVE METABOLIC PANEL
ALT: 9 U/L (ref 0–35)
AST: 16 U/L (ref 0–37)
Albumin: 4.1 g/dL (ref 3.5–5.2)
Alkaline Phosphatase: 59 U/L (ref 39–117)
BUN: 9 mg/dL (ref 6–23)
CO2: 26 mEq/L (ref 19–32)
Calcium: 9.3 mg/dL (ref 8.4–10.5)
Chloride: 104 mEq/L (ref 96–112)
Creatinine, Ser: 0.9 mg/dL (ref 0.40–1.20)
GFR: 85.14 mL/min (ref >60.00)
Glucose, Bld: 106 mg/dL — ABNORMAL HIGH (ref 70–99)
Potassium: 4.9 mEq/L (ref 3.5–5.1)
Sodium: 138 mEq/L (ref 135–145)
Total Bilirubin: 0.9 mg/dL (ref 0.2–1.2)
Total Protein: 7.2 g/dL (ref 6.0–8.3)

## 2018-12-07 LAB — FERRITIN: Ferritin: 10.4 ng/mL (ref 10.0–291.0)

## 2018-12-07 LAB — T4, FREE: Free T4: 0.83 ng/dL (ref 0.60–1.60)

## 2018-12-07 LAB — VITAMIN D 25 HYDROXY (VIT D DEFICIENCY, FRACTURES): VITD: 27.45 ng/mL — ABNORMAL LOW (ref 30.00–100.00)

## 2018-12-07 LAB — VITAMIN B12: Vitamin B-12: 144 pg/mL — ABNORMAL LOW (ref 211–911)

## 2018-12-07 MED ORDER — VITAMIN B-12 1000 MCG PO TABS: 1000.0000 ug | ORAL_TABLET | Freq: Every day | ORAL | 3 refills | Status: DC

## 2018-12-07 NOTE — Assessment & Plan Note (Signed)
Checking B12 level. She declines replacement with injections but will try to remember to take oral replacement. Rx for high dose oral replacement sent in today.

## 2018-12-07 NOTE — Assessment & Plan Note (Signed)
Not on any birth control right now and cycles are irregular. Checking CBC, ferritin for iron deficiency which could be causing some of her symptoms.

## 2018-12-07 NOTE — Assessment & Plan Note (Signed)
Concerning given prior low positive ANA. Recheck ANA. She has B12 and other vitamin deficiencies which are not adequately replaced which could be causing problems as well.

## 2018-12-07 NOTE — Progress Notes (Signed)
Virtual Visit via Video Note  I connected with Sylvia Fitzpatrick on 12/07/18 at  9:40 AM EDT by a video enabled telemedicine application and verified that I am speaking with the correct person using two identifiers.   I discussed the limitations of evaluation and management by telemedicine and the availability of in person appointments. The patient expressed understanding and agreed to proceed.  History of Present Illness: The patient is a 38 y.o. YO female with visit for body aches in her knees to her feet. Started about a couple weeks or so ago. Has pain which is aching in the knee and in the bones going down to feet. Denies recent activity or injury or overuse. Overall it is stable since onset. Does not like taking pills. Has tried excedrin which helped temporarily. She denies fevers or chills. Denies cough or headaches. Denies swelling in her joints. Prior mild positive ANA without any other positive rheum workup. Not taking any medications currently. Prior low B12 and not taking oral replacement consistently. Does not like injection and has never done those for B12. Denies numbness or weakness in hands or legs.   Observations/Objective: Appearance: normal, breathing appears normal, normal grooming, abdomen does not appear distended, mental status is A and O times 3  Assessment and Plan: See problem oriented charting  Follow Up Instructions:   I discussed the assessment and treatment plan with the patient. The patient was provided an opportunity to ask questions and all were answered. The patient agreed with the plan and demonstrated an understanding of the instructions.   The patient was advised to call back or seek an in-person evaluation if the symptoms worsen or if the condition fails to improve as anticipated.  Hoyt Koch, MD

## 2018-12-08 LAB — ANA: Anti Nuclear Antibody (ANA): NEGATIVE

## 2018-12-09 ENCOUNTER — Telehealth: Payer: Self-pay

## 2018-12-09 NOTE — Telephone Encounter (Signed)
When informing patient of lab results she stated "The pressure around my nose didn't start till Wednesday and my headaches and my eyes didn't start till yesterday. Normally I can just wait it out until it go away, but yesterday it was unbearable. I took excedrin, but that came back up. 40min after I took 800mg  of ibuprofen. The pain eased up a lot ,but still feel a little tension. I'm sure that its nothing but this pollen" wants to know if there is anything else she can take to help the pain? Has not tried allergy medication. No other allergy symptoms besides headaches and nasal pressure

## 2018-12-09 NOTE — Telephone Encounter (Signed)
Patient informed. 

## 2018-12-09 NOTE — Telephone Encounter (Signed)
Can try zyrtec otc or flonase

## 2019-01-13 ENCOUNTER — Other Ambulatory Visit: Payer: Self-pay

## 2019-01-13 ENCOUNTER — Ambulatory Visit (INDEPENDENT_AMBULATORY_CARE_PROVIDER_SITE_OTHER): Payer: 59 | Admitting: Internal Medicine

## 2019-01-13 ENCOUNTER — Encounter: Payer: Self-pay | Admitting: Internal Medicine

## 2019-01-13 ENCOUNTER — Ambulatory Visit: Payer: 59 | Admitting: Internal Medicine

## 2019-01-13 DIAGNOSIS — M79671 Pain in right foot: Secondary | ICD-10-CM | POA: Insufficient documentation

## 2019-01-13 NOTE — Assessment & Plan Note (Signed)
Can use benadryl oral or gel/ointment for the itching. Can use ice or tylenol/ibuprfofen for pain. No evidence of infection or need for antibiotics or steroids at this time. Advised she can take zyrtec TID for several days to help also.

## 2019-01-13 NOTE — Progress Notes (Unsigned)
   Subjective:   Patient ID: Sylvia Fitzpatrick, female    DOB: 1981/07/17, 38 y.o.   MRN: [id number]  HPI The patient is a 38 YO female coming in for bite on right foot and pain. Happened on {}. Overall it is {}. Has tried {}.   Review of Systems  Objective:  Physical Exam  There were no vitals filed for this visit.  Assessment & Plan:

## 2019-01-13 NOTE — Progress Notes (Signed)
   Subjective:   Patient ID: Sylvia Fitzpatrick, female    DOB: 1981-03-08, 38 y.o.   MRN: [id number]  HPI The patient is a 38 YO female coming in for right foot pain and possible bite. She was working on Wednesday and felt some itching and discomfort. She took shoes off at home and saw two red bumps. She did hear something flying around at work but does not recall bite or exact incident. She has not taken anything since that time. She has been keeping her feet up when able. Did initially swell in the first 24 hours. This is down currently as it is morning. Some redness around the two bumps. She denies drainage or spread of the redness. Mild pain and itching. Used calamine and otc cream which did not help much. Has not taken any oral medications. Overall stable since onset.   Review of Systems  Constitutional: Negative.   HENT: Negative.   Eyes: Negative.   Respiratory: Negative for cough, chest tightness and shortness of breath.   Cardiovascular: Negative for chest pain, palpitations and leg swelling.  Gastrointestinal: Negative for abdominal distention, abdominal pain, constipation, diarrhea, nausea and vomiting.  Musculoskeletal: Positive for joint swelling and myalgias.  Skin: Positive for rash.  Neurological: Negative.   Psychiatric/Behavioral: Negative.     Objective:  Physical Exam Constitutional:      Appearance: She is well-developed.  HENT:     Head: Normocephalic and atraumatic.  Neck:     Musculoskeletal: Normal range of motion.  Cardiovascular:     Rate and Rhythm: Normal rate and regular rhythm.  Pulmonary:     Effort: Pulmonary effort is normal. No respiratory distress.     Breath sounds: Normal breath sounds. No wheezing or rales.  Abdominal:     General: Bowel sounds are normal. There is no distension.     Palpations: Abdomen is soft.     Tenderness: There is no abdominal tenderness. There is no rebound.  Skin:    General: Skin is warm and dry.     Findings:  Rash present.     Comments: Right foot with 2 red bumps which do not express purulence, no cellulitis on the foot surrounding.   Neurological:     Mental Status: She is alert and oriented to person, place, and time.     Coordination: Coordination normal.     Vitals:   01/13/19 0859  BP: 118/78  Pulse: 75  Temp: 98 F (36.7 C)  TempSrc: Oral  SpO2: 99%  Weight: 168 lb (76.2 kg)  Height: 5\' 9"  (1.753 m)    Assessment & Plan:

## 2019-01-17 ENCOUNTER — Other Ambulatory Visit (INDEPENDENT_AMBULATORY_CARE_PROVIDER_SITE_OTHER): Payer: 59

## 2019-01-17 ENCOUNTER — Other Ambulatory Visit: Payer: Self-pay | Admitting: Internal Medicine

## 2019-01-17 ENCOUNTER — Telehealth: Payer: Self-pay

## 2019-01-17 DIAGNOSIS — R300 Dysuria: Secondary | ICD-10-CM

## 2019-01-17 LAB — URINALYSIS, ROUTINE W REFLEX MICROSCOPIC
Bilirubin Urine: NEGATIVE
Hgb urine dipstick: NEGATIVE
Ketones, ur: NEGATIVE
Nitrite: NEGATIVE
Specific Gravity, Urine: 1.02 (ref 1.000–1.030)
Total Protein, Urine: NEGATIVE
Urine Glucose: NEGATIVE
Urobilinogen, UA: 0.2 (ref 0.0–1.0)
pH: 7.5 (ref 5.0–8.0)

## 2019-01-17 MED ORDER — NITROFURANTOIN MONOHYD MACRO 100 MG PO CAPS: 100.0000 mg | ORAL_CAPSULE | Freq: Two times a day (BID) | ORAL | 0 refills | Status: DC

## 2019-01-17 NOTE — Telephone Encounter (Signed)
Patient called and would like to know if a order for UA and culture states urine is cloudy and has a smell. Started yesterday

## 2019-01-17 NOTE — Telephone Encounter (Signed)
Patient informed. 

## 2019-01-17 NOTE — Telephone Encounter (Signed)
Order for U/A done

## 2019-01-17 NOTE — Addendum Note (Signed)
Addended by: Pricilla Holm A on: 01/17/2019 11:15 AM   Modules accepted: Orders

## 2020-02-14 ENCOUNTER — Telehealth: Payer: Self-pay | Admitting: *Deleted

## 2020-02-14 DIAGNOSIS — R300 Dysuria: Secondary | ICD-10-CM

## 2020-02-14 NOTE — Telephone Encounter (Signed)
Patient c/o bladder pressure, LBP and urinary frequency x 2 days . She is requesting a order for a UA and a urine cx. Please advise. Thanks!

## 2020-02-15 ENCOUNTER — Other Ambulatory Visit (INDEPENDENT_AMBULATORY_CARE_PROVIDER_SITE_OTHER): Payer: 59

## 2020-02-15 DIAGNOSIS — R300 Dysuria: Secondary | ICD-10-CM

## 2020-02-15 LAB — URINALYSIS, ROUTINE W REFLEX MICROSCOPIC
Bilirubin Urine: NEGATIVE
Ketones, ur: NEGATIVE
Nitrite: NEGATIVE
Specific Gravity, Urine: 1.02 (ref 1.000–1.030)
Total Protein, Urine: NEGATIVE
Urine Glucose: NEGATIVE
Urobilinogen, UA: 0.2 (ref 0.0–1.0)
pH: 6 (ref 5.0–8.0)

## 2020-02-15 NOTE — Telephone Encounter (Signed)
Have not seen patient in more than a year should have visit.

## 2020-02-15 NOTE — Telephone Encounter (Signed)
Pt informed

## 2020-02-15 NOTE — Telephone Encounter (Signed)
Pt informed. OV scheduled 02/16/20 @ 1:00. She is requesting to have urine checked today if possible. Please advise.

## 2020-02-15 NOTE — Telephone Encounter (Signed)
Labs placed.

## 2020-02-16 ENCOUNTER — Ambulatory Visit: Payer: 59 | Admitting: Internal Medicine

## 2020-02-16 ENCOUNTER — Encounter: Payer: Self-pay | Admitting: Internal Medicine

## 2020-02-16 ENCOUNTER — Other Ambulatory Visit: Payer: Self-pay

## 2020-02-16 VITALS — BP 128/98 | HR 79 | Temp 98.3°F | Ht 69.0 in | Wt 180.0 lb

## 2020-02-16 DIAGNOSIS — E538 Deficiency of other specified B group vitamins: Secondary | ICD-10-CM | POA: Diagnosis not present

## 2020-02-16 DIAGNOSIS — R102 Pelvic and perineal pain: Secondary | ICD-10-CM

## 2020-02-16 DIAGNOSIS — Z0000 Encounter for general adult medical examination without abnormal findings: Secondary | ICD-10-CM | POA: Diagnosis not present

## 2020-02-16 LAB — URINE CULTURE
MICRO NUMBER:: 10576205
Result:: NO GROWTH
SPECIMEN QUALITY:: ADEQUATE

## 2020-02-16 LAB — CBC
HCT: 34.3 % — ABNORMAL LOW (ref 36.0–46.0)
Hemoglobin: 11 g/dL — ABNORMAL LOW (ref 12.0–15.0)
MCHC: 32 g/dL (ref 30.0–36.0)
MCV: 83.8 fl (ref 78.0–100.0)
Platelets: 300 10*3/uL (ref 150.0–400.0)
RBC: 4.1 Mil/uL (ref 3.87–5.11)
RDW: 13.2 % (ref 11.5–15.5)
WBC: 7.6 10*3/uL (ref 4.0–10.5)

## 2020-02-16 LAB — COMPREHENSIVE METABOLIC PANEL
ALT: 5 U/L (ref 0–35)
AST: 12 U/L (ref 0–37)
Albumin: 4.2 g/dL (ref 3.5–5.2)
Alkaline Phosphatase: 61 U/L (ref 39–117)
BUN: 8 mg/dL (ref 6–23)
CO2: 30 mEq/L (ref 19–32)
Calcium: 9.2 mg/dL (ref 8.4–10.5)
Chloride: 102 mEq/L (ref 96–112)
Creatinine, Ser: 0.8 mg/dL (ref 0.40–1.20)
GFR: 96.91 mL/min (ref >60.00)
Glucose, Bld: 81 mg/dL (ref 70–99)
Potassium: 4 mEq/L (ref 3.5–5.1)
Sodium: 137 mEq/L (ref 135–145)
Total Bilirubin: 0.7 mg/dL (ref 0.2–1.2)
Total Protein: 7.5 g/dL (ref 6.0–8.3)

## 2020-02-16 LAB — VITAMIN B12: Vitamin B-12: 780 pg/mL (ref 211–911)

## 2020-02-16 LAB — TSH: TSH: 0.62 u[IU]/mL (ref 0.35–4.50)

## 2020-02-16 LAB — VITAMIN D 25 HYDROXY (VIT D DEFICIENCY, FRACTURES): VITD: 27.9 ng/mL — ABNORMAL LOW (ref 30.00–100.00)

## 2020-02-16 MED ORDER — SULFAMETHOXAZOLE-TRIMETHOPRIM 800-160 MG PO TABS
1.0000 | ORAL_TABLET | Freq: Two times a day (BID) | ORAL | 0 refills | Status: DC
Start: 2020-02-16 — End: 2020-06-17

## 2020-02-16 NOTE — Patient Instructions (Signed)
We have sent in the bactrim to take 1 pill twice a day for 5 days.  We will check the labs.

## 2020-02-16 NOTE — Assessment & Plan Note (Signed)
Needs recheck levels having some recurrence of symptoms. Taking oral B12 daily.

## 2020-02-16 NOTE — Progress Notes (Signed)
   Subjective:   Patient ID: Sylvia Fitzpatrick, female    DOB: April 13, 1981, 39 y.o.   MRN: [id number]  HPI The patient is a 39 YO female coming in for concerns about possible UTI. Started 2-3 days ago with low back pain and suprapubic pressure. Some increase in urgency and frequency. No pain with urination. Denies fevers or chills. Denies nausea or vomiting. No blood in urine.   Review of Systems  Constitutional: Negative.   HENT: Negative.   Eyes: Negative.   Respiratory: Negative for cough, chest tightness and shortness of breath.   Cardiovascular: Negative for chest pain, palpitations and leg swelling.  Gastrointestinal: Positive for abdominal pain. Negative for abdominal distention, constipation, diarrhea, nausea and vomiting.  Genitourinary: Positive for frequency and urgency.  Musculoskeletal: Positive for back pain.  Skin: Negative.   Neurological: Negative.   Psychiatric/Behavioral: Negative.     Objective:  Physical Exam Constitutional:      Appearance: She is well-developed.  HENT:     Head: Normocephalic and atraumatic.  Cardiovascular:     Rate and Rhythm: Normal rate and regular rhythm.  Pulmonary:     Effort: Pulmonary effort is normal. No respiratory distress.     Breath sounds: Normal breath sounds. No wheezing or rales.  Abdominal:     General: Bowel sounds are normal. There is no distension.     Palpations: Abdomen is soft.     Tenderness: There is no abdominal tenderness. There is no rebound.  Musculoskeletal:        General: Tenderness present.     Cervical back: Normal range of motion.     Comments: Tenderness low back and suprapubic area  Skin:    General: Skin is warm and dry.  Neurological:     Mental Status: She is alert and oriented to person, place, and time.     Coordination: Coordination normal.     Vitals:   02/16/20 1049  BP: (!) 128/98  Pulse: 79  Temp: 98.3 F (36.8 C)  TempSrc: Oral  SpO2: 99%  Weight: 180 lb (81.6 kg)  Height:  5\' 9"  (1.753 m)    This visit occurred during the SARS-CoV-2 public health emergency.  Safety protocols were in place, including screening questions prior to the visit, additional usage of staff PPE, and extensive cleaning of exam room while observing appropriate contact time as indicated for disinfecting solutions.   Assessment & Plan:

## 2020-02-16 NOTE — Assessment & Plan Note (Signed)
With low back pain and rx bactrim. Urine culture pending.

## 2020-06-13 ENCOUNTER — Encounter: Payer: Self-pay | Admitting: Gastroenterology

## 2020-06-14 ENCOUNTER — Encounter: Payer: Self-pay | Admitting: *Deleted

## 2020-06-17 ENCOUNTER — Ambulatory Visit: Payer: 59 | Admitting: Gastroenterology

## 2020-06-17 ENCOUNTER — Encounter: Payer: Self-pay | Admitting: Gastroenterology

## 2020-06-17 ENCOUNTER — Other Ambulatory Visit (HOSPITAL_COMMUNITY): Payer: Self-pay | Admitting: Gastroenterology

## 2020-06-17 VITALS — BP 118/78 | HR 73 | Ht 69.0 in | Wt 180.0 lb

## 2020-06-17 DIAGNOSIS — K6289 Other specified diseases of anus and rectum: Secondary | ICD-10-CM

## 2020-06-17 DIAGNOSIS — R1013 Epigastric pain: Secondary | ICD-10-CM

## 2020-06-17 DIAGNOSIS — G8929 Other chronic pain: Secondary | ICD-10-CM

## 2020-06-17 DIAGNOSIS — D649 Anemia, unspecified: Secondary | ICD-10-CM

## 2020-06-17 DIAGNOSIS — E538 Deficiency of other specified B group vitamins: Secondary | ICD-10-CM | POA: Diagnosis not present

## 2020-06-17 MED ORDER — PANTOPRAZOLE SODIUM 40 MG PO TBEC
40.0000 mg | DELAYED_RELEASE_TABLET | Freq: Two times a day (BID) | ORAL | 0 refills | Status: DC
Start: 2020-06-17 — End: 2021-03-04

## 2020-06-17 MED ORDER — SUTAB 1479-225-188 MG PO TABS: 1.0000 | ORAL_TABLET | ORAL | 0 refills | Status: DC
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
## 2020-06-17 NOTE — Patient Instructions (Signed)
We have sent the following medications to your pharmacy for you to pick up at your convenience: Pantoprazole 40 mg twice daily  You have been scheduled for an endoscopy and colonoscopy. Please follow the written instructions given to you at your visit today. Please pick up your prep supplies at the pharmacy within the next 1-3 days. If you use inhalers (even only as needed), please bring them with you on the day of your procedure.  If you are age 38 or younger, your body mass index should be between 19-25. Your Body mass index is 26.58 kg/m. If this is o ut of the aformentioned range listed, please consider follow up with your Primary Care Provider.   Due to recent changes in healthcare laws, you may see the results of your imaging and laboratory studies on MyChart before your provider has had a chance to review them.  We understand that in some cases there may be results that are confusing or concerning to you. Not all laboratory results come back in the same time frame and the provider may be waiting for multiple results in order to interpret others.  Please give [place] 48 hours in order for your provider to thoroughly review all the results before contacting the office for clarification of your results.

## 2020-07-04 ENCOUNTER — Encounter: Payer: Self-pay | Admitting: Gastroenterology

## 2020-07-08 ENCOUNTER — Ambulatory Visit (AMBULATORY_SURGERY_CENTER): Payer: 59 | Admitting: Gastroenterology

## 2020-07-08 ENCOUNTER — Other Ambulatory Visit: Payer: Self-pay

## 2020-07-08 ENCOUNTER — Encounter: Payer: Self-pay | Admitting: Gastroenterology

## 2020-07-08 VITALS — BP 125/79 | HR 68 | Temp 97.1°F | Resp 18 | Ht 69.0 in | Wt 180.0 lb

## 2020-07-08 DIAGNOSIS — D123 Benign neoplasm of transverse colon: Secondary | ICD-10-CM | POA: Diagnosis not present

## 2020-07-08 DIAGNOSIS — K2970 Gastritis, unspecified, without bleeding: Secondary | ICD-10-CM | POA: Diagnosis not present

## 2020-07-08 DIAGNOSIS — K269 Duodenal ulcer, unspecified as acute or chronic, without hemorrhage or perforation: Secondary | ICD-10-CM

## 2020-07-08 DIAGNOSIS — K6289 Other specified diseases of anus and rectum: Secondary | ICD-10-CM

## 2020-07-08 DIAGNOSIS — D509 Iron deficiency anemia, unspecified: Secondary | ICD-10-CM | POA: Diagnosis not present

## 2020-07-08 DIAGNOSIS — K225 Diverticulum of esophagus, acquired: Secondary | ICD-10-CM | POA: Diagnosis not present

## 2020-07-08 DIAGNOSIS — Z1211 Encounter for screening for malignant neoplasm of colon: Secondary | ICD-10-CM | POA: Diagnosis not present

## 2020-07-08 DIAGNOSIS — R1013 Epigastric pain: Secondary | ICD-10-CM

## 2020-07-08 DIAGNOSIS — R109 Unspecified abdominal pain: Secondary | ICD-10-CM | POA: Diagnosis not present

## 2020-07-08 DIAGNOSIS — K2950 Unspecified chronic gastritis without bleeding: Secondary | ICD-10-CM | POA: Diagnosis not present

## 2020-07-08 DIAGNOSIS — R1084 Generalized abdominal pain: Secondary | ICD-10-CM

## 2020-07-08 DIAGNOSIS — B9681 Helicobacter pylori [H. pylori] as the cause of diseases classified elsewhere: Secondary | ICD-10-CM | POA: Diagnosis not present

## 2020-07-08 MED ORDER — SODIUM CHLORIDE 0.9 % IV SOLN: 500.0000 mL | Freq: Once | INTRAVENOUS | Status: DC

## 2020-07-08 NOTE — Patient Instructions (Signed)
Handout given for polyps.  YOU HAD AN ENDOSCOPIC PROCEDURE TODAY AT [facility]:   Refer to the procedure report that was given to you for any specific questions about what was found during the examination.  If the procedure report does not answer your questions, please call your gastroenterologist to clarify.  If you requested that your care partner not be given the details of your procedure findings, then the procedure report has been included in a sealed envelope for you to review at your convenience later.  YOU SHOULD EXPECT: Some feelings of bloating in the abdomen. Passage of more gas than usual.  Walking can help get rid of the air that was put into your GI tract during the procedure and reduce the bloating. If you had a lower endoscopy (such as a colonoscopy or flexible sigmoidoscopy) you may notice spotting of blood in your stool or on the toilet paper. If you underwent a bowel prep for your procedure, you may not have a normal bowel movement for a few days.  Please Note:  You might notice some irritation and congestion in your nose or some drainage.  This is from the oxygen used during your procedure.  There is no need for concern and it should clear up in a day or so.  SYMPTOMS TO REPORT IMMEDIATELY:   Following lower endoscopy (colonoscopy or flexible sigmoidoscopy):  Excessive amounts of blood in the stool  Significant tenderness or worsening of abdominal pains  Swelling of the abdomen that is new, acute  Fever of 100F or higher   Following upper endoscopy (EGD)  Vomiting of blood or coffee ground material  New chest pain or pain under the shoulder blades  Painful or persistently difficult swallowing  New shortness of breath  Fever of 100F or higher  Black, tarry-looking stools  For urgent or emergent issues, a gastroenterologist can be reached at any hour by calling [phone]. Do not use MyChart messaging for urgent concerns.    DIET:  We do  recommend a small meal at first, but then you may proceed to your regular diet.  Drink plenty of fluids but you should avoid alcoholic beverages for 24 hours.  ACTIVITY:  You should plan to take it easy for the rest of today and you should NOT DRIVE or use heavy machinery until tomorrow (because of the sedation medicines used during the test).    FOLLOW UP: Our staff will call the number listed on your records 48-72 hours following your procedure to check on you and address any questions or concerns that you may have regarding the information given to you following your procedure. If we do not reach you, we will leave a message.  We will attempt to reach you two times.  During this call, we will ask if you have developed any symptoms of COVID 19. If you develop any symptoms (ie: fever, flu-like symptoms, shortness of breath, cough etc.) before then, please call [phone].  If you test positive for Covid 19 in the 2 weeks post procedure, please call and report this information to [place].    If any biopsies were taken you will be contacted by phone or by letter within the next 1-3 weeks.  Please call us at [phone] if you have not heard about the biopsies in 3 weeks.    SIGNATURES/CONFIDENTIALITY: You and/or your care partner have signed paperwork which will be entered into your electronic medical record.  These signatures attest to the fact that  that the information above on your After Visit Summary has been reviewed and is understood.  Full responsibility of the confidentiality of this discharge information lies with you and/or your care-partner.

## 2020-07-08 NOTE — Op Note (Signed)
[facility] Patient Name: Sylvia Fitzpatrick Procedure Date: 07/08/2020 2:33 PM MRN: [id number] Endoscopist: Thornton Park MD, MD Age: 39 Referring MD:  Date of Birth: 1981-07-22 Gender: Female Account #: [id number] Procedure:                Colonoscopy Indications:              Abdominal pain, Unexplained normocytic anemia                            without overt bleeding Medicines:                Monitored Anesthesia Care Procedure:                Pre-Anesthesia Assessment:                           - Prior to the procedure, a History and Physical                            was performed, and patient medications and                            allergies were reviewed. The patient's tolerance of                            previous anesthesia was also reviewed. The risks                            and benefits of the procedure and the sedation                            options and risks were discussed with the patient.                            All questions were answered, and informed consent                            was obtained. Prior Anticoagulants: The patient has                            taken no previous anticoagulant or antiplatelet                            agents. ASA Grade Assessment: II - A patient with                            mild systemic disease. After reviewing the risks                            and benefits, the patient was deemed in                            satisfactory condition to undergo the procedure.  After obtaining informed consent, the colonoscope                            was passed under direct vision. Throughout the                            procedure, the patient's blood pressure, pulse, and                            oxygen saturations were monitored continuously. The                            Colonoscope was introduced through the anus and                            advanced to the the cecum,  identified by                            appendiceal orifice and ileocecal valve. The                            colonoscopy was performed without difficulty. The                            patient tolerated the procedure well. The quality                            of the bowel preparation was good. The terminal                            ileum, ileocecal valve, appendiceal orifice, and                            rectum were photographed. Scope In: 2:44:29 PM Scope Out: 3:04:38 PM Scope Withdrawal Time: 0 hours 16 minutes 33 seconds  Total Procedure Duration: 0 hours 20 minutes 9 seconds  Findings:                 The perianal and digital rectal examinations were                            normal.                           Two sessile polyps were found in the hepatic                            flexure. The polyps were 2 to 3 mm in size. These                            polyps were removed with a cold snare. Resection                            and retrieval were complete. Estimated blood loss  was minimal.                           The exam was otherwise without abnormality on                            direct and retroflexion views. Complications:            No immediate complications. Estimated blood loss:                            Minimal. Estimated Blood Loss:     Estimated blood loss was minimal. Impression:               - Two 2 to 3 mm polyps at the hepatic flexure,                            removed with a cold snare. Resected and retrieved.                           - The examination was otherwise normal on direct                            and retroflexion views. Recommendation:           - Patient has a contact number available for                            emergencies. The signs and symptoms of potential                            delayed complications were discussed with the                            patient. Return to normal activities  tomorrow.                            Written discharge instructions were provided to the                            patient.                           - Resume previous diet.                           - Continue present medications.                           - Await pathology results.                           - Repeat colonoscopy date to be determined after                            pending pathology results are reviewed for  surveillance.                           - Emerging evidence supports eating a diet of                            fruits, vegetables, grains, calcium, and yogurt                            while reducing red meat and alcohol may reduce the                            risk of colon cancer. Thornton Park MD, MD 07/08/2020 3:15:37 PM This report has been signed electronically.

## 2020-07-08 NOTE — Progress Notes (Signed)
Vitals by Haskell Riling

## 2020-07-08 NOTE — Op Note (Addendum)
[facility] Patient Name: Sylvia Fitzpatrick Procedure Date: 07/08/2020 2:34 PM MRN: [id number] Endoscopist: Thornton Park MD, MD Age: 39 Referring MD:  Date of Birth: [birth year] Gender: Female Account #: [id number] Procedure:                Upper GI endoscopy Indications:              Abdominal pain, Unexplained normocytic anemia,                            Abdominal bloating Medicines:                Monitored Anesthesia Care Procedure:                Pre-Anesthesia Assessment:                           - Prior to the procedure, a History and Physical                            was performed, and patient medications and                            allergies were reviewed. The patient's tolerance of                            previous anesthesia was also reviewed. The risks                            and benefits of the procedure and the sedation                            options and risks were discussed with the patient.                            All questions were answered, and informed consent                            was obtained. Prior Anticoagulants: The patient has                            taken no previous anticoagulant or antiplatelet                            agents. ASA Grade Assessment: II - A patient with                            mild systemic disease. After reviewing the risks                            and benefits, the patient was deemed in                            satisfactory condition to undergo the procedure.  After obtaining informed consent, the endoscope was                            passed under direct vision. Throughout the                            procedure, the patient's blood pressure, pulse, and                            oxygen saturations were monitored continuously. The                            Endoscope was introduced through the mouth, and                            advanced to the third part of  duodenum. The upper                            GI endoscopy was accomplished without difficulty.                            The patient tolerated the procedure well. Scope In: Scope Out: Findings:                 The esophagus was normal.                           The entire examined stomach was normal. Biopsies                            were taken with a cold forceps for Helicobacter                            pylori testing. Estimated blood loss was minimal.                           A medium diverticulum was found in the first                            portion of the duodenum.                           One non-bleeding cratered duodenal ulcer with no                            stigmata of bleeding was found in the first portion                            of the duodenum. The lesion was 6 mm in largest                            dimension.                           The exam was otherwise  without abnormality. Complications:            No immediate complications. Estimated blood loss:                            Minimal. Estimated Blood Loss:     Estimated blood loss was minimal. Impression:               - Normal esophagus.                           - Normal stomach. Biopsied.                           - Duodenal diverticulum.                           - Non-bleeding duodenal ulcer with no stigmata of                            bleeding. The likely souce of abdominal pain and                            anemia.                           - The examination was otherwise normal. Recommendation:           - Patient has a contact number available for                            emergencies. The signs and symptoms of potential                            delayed complications were discussed with the                            patient. Return to normal activities tomorrow.                            Written discharge instructions were provided to the                            patient.                            - Resume previous diet.                           - Continue present medications including                            pantoprazole 40 mg BID for at least 12 weeks. .                           - No aspirin, ibuprofen, naproxen, or other  non-steroidal anti-inflammatory drugs.                           - Await pathology results.                           - Repeat upper endoscopy in 3 months to check                            healing. Thornton Park MD, MD 07/08/2020 3:12:29 PM This report has been signed electronically.

## 2020-07-08 NOTE — Progress Notes (Signed)
To PACU, VSS. Report to [email or web address] 

## 2020-07-10 ENCOUNTER — Telehealth: Payer: Self-pay

## 2020-07-10 NOTE — Telephone Encounter (Signed)
  Follow up Call-  Call back number 07/08/2020  Post procedure Call Back phone  # [phone]  Permission to leave phone message Yes  Some recent data might be hidden     Patient questions:  Do you have a fever, pain , or abdominal swelling? No. Pain Score  0 *  Have you tolerated food without any problems? Yes.    Have you been able to return to your normal activities? Yes.    Do you have any questions about your discharge instructions: Diet   No. Medications  No. Follow up visit  No.  Do you have questions or concerns about your Care? No.  Actions: * If pain score is 4 or above: 1. No action needed, pain <4.Have you developed a fever since your procedure? no  2.   Have you had an respiratory symptoms (SOB or cough) since your procedure? no  3.   Have you tested positive for COVID 19 since your procedure no  4.   Have you had any family members/close contacts diagnosed with the COVID 19 since your procedure?  no   If yes to any of these questions please route to Joylene John, RN and Joella Prince, RN

## 2020-07-16 ENCOUNTER — Other Ambulatory Visit: Payer: Self-pay

## 2020-07-16 ENCOUNTER — Other Ambulatory Visit (HOSPITAL_COMMUNITY): Payer: Self-pay | Admitting: Gastroenterology

## 2020-07-16 MED ORDER — BISMUTH 262 MG PO CHEW: 524.0000 mg | CHEWABLE_TABLET | Freq: Four times a day (QID) | ORAL | 0 refills | Status: DC

## 2020-07-16 MED ORDER — METRONIDAZOLE 500 MG PO TABS: 500.0000 mg | ORAL_TABLET | Freq: Four times a day (QID) | ORAL | 0 refills | Status: DC

## 2020-07-16 MED ORDER — TETRACYCLINE HCL 500 MG PO CAPS: 500.0000 mg | ORAL_CAPSULE | Freq: Four times a day (QID) | ORAL | 0 refills | Status: DC

## 2020-07-22 ENCOUNTER — Telehealth: Payer: Self-pay

## 2020-07-22 DIAGNOSIS — B9681 Helicobacter pylori [H. pylori] as the cause of diseases classified elsewhere: Secondary | ICD-10-CM

## 2020-07-22 DIAGNOSIS — R110 Nausea: Secondary | ICD-10-CM

## 2020-07-22 DIAGNOSIS — Q400 Congenital hypertrophic pyloric stenosis: Secondary | ICD-10-CM

## 2020-07-22 NOTE — Telephone Encounter (Signed)
Routing this message to doc of the day, Dr. Carlean Purl, d/t Dr. Tarri Glenn being off.

## 2020-07-22 NOTE — Telephone Encounter (Signed)
Attempted to call pt x2 on work # but was not successful in reaching pt (rang several times then disconnected with each attempt). Called cell # and LVM requesting returned call.

## 2020-07-22 NOTE — Telephone Encounter (Signed)
If the main side effect is nausea I would ask her to try to take some ondansetron 4 mg every 8 hours as needed #30 no refill.  If it is pain or something different let me know.  The reason I would like her to try that is the medication regimen that is prescribed is thought to be the most effective and if we change it while other regimens do work they are not thought to be as good at eradicating H pylori.  That said if it is intolerable then she can just hold it and we will see which regimen Dr. Tarri Glenn wants to use when she returns.

## 2020-07-22 NOTE — Telephone Encounter (Signed)
Pt returned call but requested I call her at the following #:  [phone]  Called pt as requested. Informed her about Dr. Celesta Aver response below. Pt symptoms are nausea and occasional cramping. Feels she has lost her appetite. Reassurance and education provided. States she has missed 3 doses and does not want to feel this way even if she takes Zofran. States she understands this is the most effective regimen but is requesting to wait until Dr. Tarri Glenn returns tomorrow. Advised I will forward this message to Dr. Tarri Glenn for continuity of care and for her recommendation. Routing to Dr. Carlean Purl as well to make him aware of pt preference.

## 2020-07-22 NOTE — Telephone Encounter (Signed)
Pt at work and requested to speak with Dr. Tarri Glenn assistant directly, call was then transferred directly to me per her request. Pt states she started treatment over the weekend for [facility]. States she is having horrible GI upset with Flagyl and Tetracycline. Requesting alternative. Routing this message to triage nurse prescribing medications per Dr. Tarri Glenn orders for continuity of care purposes and also routing to Dr. Tarri Glenn for alternative treatment.

## 2020-07-23 MED ORDER — ONDANSETRON HCL 4 MG PO TABS: 4.0000 mg | ORAL_TABLET | Freq: Three times a day (TID) | ORAL | 0 refills | Status: DC | PRN

## 2020-07-23 NOTE — Telephone Encounter (Signed)
Called mobile # and left message advising she return my call at the office # rather than the # she has been calling that belongs to another [place]. Will await returned call.

## 2020-07-23 NOTE — Telephone Encounter (Signed)
Called pt @ [phone], [phone] (was informed by staff to call [phone] but no answer) and called [phone]. LVM on mobile # requesting returned call.

## 2020-07-23 NOTE — Telephone Encounter (Signed)
Patient is returning your call and asked that you call her back at the lab number located in the basement

## 2020-07-23 NOTE — Telephone Encounter (Signed)
This really is the best treatment plan and the most likely regimen to lead to eradication. Alcohol will exacerbate the symptoms, so she should not use any beer, wine, liquor, or non-alcoholic beer while use the 4 medications. Is she doing any better getting through it?

## 2020-07-23 NOTE — Telephone Encounter (Signed)
Pt called back line CMA phone # while I was with a patient. Pt was made aware that I was unavailable at the time of her call. Still currently with pts and unable to provide her with my undivided attention. Will return her call as I am able.

## 2020-07-23 NOTE — Telephone Encounter (Signed)
Again, returned pt call as requested. Informed her about Dr. Tarri Glenn response below. Verbalized acceptance and understanding. States she will try the Zofran as ordered by Dr. Carlean Purl to see if this will help.   Outpatient Medication Detail   Disp Refills Start End   ondansetron (ZOFRAN) 4 MG tablet 30 tablet 0 07/23/2020    Sig - Route: Take 1 tablet (4 mg total) by mouth every 8 (eight) hours as needed for nausea or vomiting. - Oral   Sent to pharmacy as: ondansetron (ZOFRAN) 4 MG tablet   E-Prescribing Status: Receipt confirmed by pharmacy (07/23/2020 1:24 PM EST)    Pharmacy  [place], [place] - [address]  [place], [place] [address]  Phone:  [phone] Fax:  [phone]

## 2020-07-23 NOTE — Addendum Note (Signed)
Addended by: Hardie Pulley, [place] Elsen J on: 07/23/2020 01:25 PM   Modules accepted: Orders

## 2020-08-12 ENCOUNTER — Telehealth: Payer: Self-pay | Admitting: *Deleted

## 2020-08-12 DIAGNOSIS — E538 Deficiency of other specified B group vitamins: Secondary | ICD-10-CM

## 2020-08-12 NOTE — Telephone Encounter (Signed)
Patient called requesting labs below. Please advise.  iron studies, cbc, and ESR.

## 2020-08-13 ENCOUNTER — Other Ambulatory Visit: Payer: 59

## 2020-08-13 NOTE — Telephone Encounter (Signed)
Is there a reason she is asking for ESR? The others I can order but I would need the reason for ESR

## 2020-08-13 NOTE — Telephone Encounter (Signed)
I called pt- she states she has been having joint pain, mostly in her right knee and wants to check on her anemia.

## 2020-08-14 NOTE — Telephone Encounter (Signed)
Labs ordered.

## 2020-08-15 ENCOUNTER — Other Ambulatory Visit (INDEPENDENT_AMBULATORY_CARE_PROVIDER_SITE_OTHER): Payer: 59

## 2020-08-15 ENCOUNTER — Other Ambulatory Visit: Payer: Self-pay

## 2020-08-15 DIAGNOSIS — E538 Deficiency of other specified B group vitamins: Secondary | ICD-10-CM | POA: Diagnosis not present

## 2020-08-15 LAB — CBC
HCT: 34.4 % — ABNORMAL LOW (ref 36.0–46.0)
Hemoglobin: 11.2 g/dL — ABNORMAL LOW (ref 12.0–15.0)
MCHC: 32.6 g/dL (ref 30.0–36.0)
MCV: 82 fl (ref 78.0–100.0)
Platelets: 316 10*3/uL (ref 150.0–400.0)
RBC: 4.2 Mil/uL (ref 3.87–5.11)
RDW: 14.6 % (ref 11.5–15.5)
WBC: 7.9 10*3/uL (ref 4.0–10.5)

## 2020-08-15 LAB — FERRITIN: Ferritin: 6.9 ng/mL — ABNORMAL LOW (ref 10.0–291.0)

## 2020-08-15 NOTE — Telephone Encounter (Signed)
Pt informed of below.  

## 2020-08-16 ENCOUNTER — Other Ambulatory Visit: Payer: Self-pay

## 2020-08-16 ENCOUNTER — Telehealth: Payer: Self-pay

## 2020-08-16 ENCOUNTER — Other Ambulatory Visit (INDEPENDENT_AMBULATORY_CARE_PROVIDER_SITE_OTHER): Payer: 59

## 2020-08-16 DIAGNOSIS — K6289 Other specified diseases of anus and rectum: Secondary | ICD-10-CM

## 2020-08-16 LAB — SEDIMENTATION RATE: Sed Rate: 38 mm/hr — ABNORMAL HIGH (ref 0–20)

## 2020-08-16 LAB — HIGH SENSITIVITY CRP: CRP, High Sensitivity: 2.02 mg/L (ref 0.000–5.000)

## 2020-08-16 NOTE — Telephone Encounter (Signed)
Patient notified. She thanks you for the good care.

## 2020-08-16 NOTE — Telephone Encounter (Signed)
Please arrange for ESR, CRP, fecal calprotectin and follow-up office visit. Thanks.

## 2020-08-16 NOTE — Telephone Encounter (Signed)
Patient contacting us with concerns about shooting pains that are near her rectum. She feels this most when passing gas. No changes in bowel habits, no nausea, no pain with bowel movements and no blood. She does feel like she is cold all the time. She would like to have labs drawn to see if she has inflammation anywhere. Please advise.

## 2020-08-20 LAB — CALPROTECTIN, FECAL: Calprotectin, Fecal: 36 ug/g (ref 0–120)

## 2020-09-24 ENCOUNTER — Other Ambulatory Visit: Payer: Self-pay

## 2020-09-24 ENCOUNTER — Telehealth: Payer: Self-pay

## 2020-09-24 DIAGNOSIS — A048 Other specified bacterial intestinal infections: Secondary | ICD-10-CM

## 2020-09-24 NOTE — Telephone Encounter (Signed)
-----   Message from Algernon Huxley, RN sent at 07/16/2020  4:04 PM EST ----- Regarding: Stool antigen Pt needs to do stool for h pylori

## 2020-09-24 NOTE — Telephone Encounter (Signed)
Call placed to pt to remind her to come in for stool antigen test for h pylori. Order in epic, pt will need to be off PPI for 2 weeks prior to submitting stool specimen. Order in epic.

## 2020-09-27 ENCOUNTER — Other Ambulatory Visit: Payer: 59

## 2020-09-27 ENCOUNTER — Encounter: Payer: Self-pay | Admitting: Gastroenterology

## 2020-09-27 ENCOUNTER — Ambulatory Visit (INDEPENDENT_AMBULATORY_CARE_PROVIDER_SITE_OTHER): Payer: 59 | Admitting: Gastroenterology

## 2020-09-27 VITALS — BP 130/80 | HR 78 | Ht 69.0 in | Wt 187.0 lb

## 2020-09-27 DIAGNOSIS — R140 Abdominal distension (gaseous): Secondary | ICD-10-CM

## 2020-09-27 DIAGNOSIS — R1013 Epigastric pain: Secondary | ICD-10-CM

## 2020-09-27 DIAGNOSIS — K2970 Gastritis, unspecified, without bleeding: Secondary | ICD-10-CM

## 2020-09-27 DIAGNOSIS — B9681 Helicobacter pylori [H. pylori] as the cause of diseases classified elsewhere: Secondary | ICD-10-CM

## 2020-09-27 NOTE — Patient Instructions (Addendum)
Your recent abdominal pain and bloating are likely due to H. pylori.  I am so glad that you are feeling better after completing antibiotics.  I have recommended a stool study today to document eradication of the infection.  Given your duodenal ulcer, would consider repeat endoscopy in 3 months to document healing of the ulcer.  Your colonoscopy revealed 2 polyps.  One was a tubular adenoma, the other was benign.  These are considered to be pre-cancerous polyps that may have grown into cancers if they had not been removed.  There was no cancer.  Given these results you should have another colonoscopy in 7 years to make sure that you do not develop additional polyps.  Emerging evidence supports eating a diet of fruits, vegetables, grains, calcium, and yogurt while reducing red meat and alcohol may reduce the risk of colon cancer.  Please let me know if you have any additional rectal pain so that we can proceed with additional evaluation and treatment.  LABS: Your provider has requested that you go to the basement level for lab work before leaving today. Press "B" on the elevator. The lab is located at the first door on the left as you exit the elevator.  HEALTHCARE LAWS AND MY CHART RESULTS: Due to recent changes in healthcare laws, you may see the results of your imaging and laboratory studies on MyChart before your provider has had a chance to review them.  We understand that in some cases there may be results that are confusing or concerning to you. Not all laboratory results come back in the same time frame and the provider may be waiting for multiple results in order to interpret others.  Please give [place] 48 hours in order for your provider to thoroughly review all the results before contacting the office for clarification of your results.   If you are age 64 or younger, your body mass index should be between 19-25. Your Body mass index is 27.62 kg/m. If this is out of the aformentioned range listed,  please consider follow up with your Primary Care Provider.   Thank you for trusting me with your gastrointestinal care!    Thornton Park, MD, MPH

## 2020-09-27 NOTE — Progress Notes (Addendum)
Referring Provider: Hoyt Koch, * Primary Care Physician:  Hoyt Koch, MD  Chief complaint: H pylori gastritis, shooting pains   IMPRESSION:  H. pylori associated duodenal ulcer: May be the cause of her recent abdominal pain and bloating.  Symptoms largely resolved with quadruple therapy.  We will plan H. pylori stool antigen to document eradication.  Consider EGD in 3 weeks to monitor response to therapy given the associated iron deficiency anemia.  Rectal pain: No obvious source on colonoscopy.  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 No associated symptoms at this time.  Tubular adenoma on colonoscopy 07/08/2020: Surveillance recommended in 2028  Elevated ESR and new onset hand pain: [name]. No IBD found on endoscopy.   Iron deficiency anemia: May be related to H pylori and PUD. Will monitor closely.    PLAN: Avoid all NSAIDs H pylori stool antigen Surveillance colonoscopy in 2028 Consider repeat EGD in 3 months to document healing of duodenal ulcer Follow-up if rectal pain returns   HPI: Sylvia Fitzpatrick is a 40 y.o. female who returns in follow-up for years of intermittent abdominal pain with recent escalation in symptoms, now with concurrent bloating as well as evaluation for rectal spasms that occur both postdefecation and without preceding symptoms. Severe anorectal pain lasting less than 30 seconds. Never lasts more than 30 minutes. Occurs weekly. Not consistently related to defecation. No anorectal pain with defecation. No associated tenesmus. No rectal bleeding. Some mucous. Pain is not precipitated by sitting, emotional stress, menstruation, or  car rides.  May be associated with stress. She is not sexual active.   Evaluation has included: - Normal abdominal ultrasound 01/18/18.  - Normal TSH. CMP - Hemoglobin 11, MCV 83.8, RDW 13.2 - Hemoglobin was normal in 2019. 11.5 in 2020 - Iron deficiency anemia with ferritin of 6.9, hemoglobin of 11.2 08/15/20 - ESR elevated at 38, CRP normal at 2.02, fecal calprotectin normal at 36 08/16/20 - EGD and colonoscopy 07/08/20.  EGD revealed duodenal diverticulum, and an H. pylori associated duodenal ulcer.  Colonoscopy revealed 2 polyps at the hepatic flexure, one of which was a tubular adenoma, and was otherwise normal.  She was treated with bismuth subsalicylate, metronidazole, tetracycline, and pantoprazole for 14 days.  The treatment was complicated by severe GI upset that was treated with Zofran.  She returns now in follow-up approximately 6 weeks after completing antibiotics.  She is overall feeling better.  Bloating has resolved.  She is only had 1 episode of rectal pain and that was yesterday.  No identified triggers.  Her main complaint today is 3 weeks of bilateral hand pain.  No stiffness or myalgias.  She does not like to take medications but is using calcium, vitamin D, and vitamin B12.  She has not been on a proton pump inhibitor in over a month.  Past Medical History:  Diagnosis Date   B12 deficiency    H. pylori infection    Headache(784.0)    Prior hx migraines   Hepatomegaly 2017   PONV (postoperative nausea and vomiting)    n/v after iv anesthetic    Past Surgical History:  Procedure Laterality Date   COLONOSCOPY     ESOPHAGOGASTRODUODENOSCOPY  MULTIPLE TOOTH EXTRACTIONS     TUBAL LIGATION Bilateral 02/07/2014   Procedure: POST PARTUM TUBAL LIGATION;  Surgeon: Donnamae Jude, MD;  Location: [facility];  Service: Gynecology;  Laterality: Bilateral;    Current Outpatient Medications  Medication Sig Dispense Refill   Calcium Carbonate (CALCIUM 500 PO) Take by  mouth.     Cholecalciferol (VITAMIN D PO) Take 1 tablet by mouth daily.     Multiple Vitamins-Minerals (MULTIVITAMIN ADULT PO) Take 1 tablet by mouth daily.     ondansetron (ZOFRAN) 4 MG tablet Take 1 tablet (4 mg total) by mouth every 8 (eight) hours as needed for nausea or vomiting. 30 tablet 0   pantoprazole (PROTONIX) 40 MG tablet Take 1 tablet (40 mg total) by mouth 2 (two) times daily. (Patient taking differently: Take 40 mg by mouth 2 (two) times daily. Just takes as needed) 180 tablet 0   vitamin B-12 (CYANOCOBALAMIN) 1000 MCG tablet Take 1 tablet (1,000 mcg total) by mouth daily. 90 tablet 3   No current facility-administered medications for this visit.    Allergies as of 09/27/2020   (No Known Allergies)    Family History  Problem Relation Age of Onset   Stroke Father        x multiple   Heart attack Father    Prostate cancer Maternal Grandfather    Colon cancer Maternal Aunt    Colon polyps Neg Hx    Stomach cancer Neg Hx    Esophageal cancer Neg Hx     Social History   Socioeconomic History   Marital status: Single    Spouse name: Not on file   Number of children: 3   Years of education: Not on file   Highest education level: Not on file  Occupational History   Occupation: [employer]  Tobacco Use   Smoking status: Never Smoker   Smokeless tobacco: Never Used  Scientific laboratory technician Use: Never used  Substance and Sexual Activity   Alcohol use: No   Drug use: No   Sexual activity: Yes    Birth control/protection: Surgical  Other Topics Concern   Not on file  Social History Narrative   Not on file   Social Determinants of Health   Financial Resource Strain: Not on file  Food Insecurity: Not on file  Transportation Needs: Not on file  Physical Activity: Not on file  Stress: Not on file  Social Connections: Not on file  Intimate Partner Violence: Not on file     Physical Exam: General:   Alert,  well-nourished, pleasant and  cooperative in NAD Head:  Normocephalic and atraumatic. Eyes:  Sclera clear, no icterus.   Conjunctiva pink. Abdomen:  Soft, no tenderness, nondistended, normal bowel sounds, no rebound or guarding. No hepatosplenomegaly.   Neurologic:  Alert and  oriented x4;  grossly nonfocal Skin:  Intact without significant lesions or rashes. Psych:  Alert and cooperative. Normal mood and affect.    De Libman L. Tarri Glenn, MD, MPH 09/27/2020, 3:56 PM

## 2020-09-30 LAB — HELICOBACTER PYLORI  SPECIAL ANTIGEN
MICRO NUMBER:: 11443629
RESULT:: DETECTED — AB
SPECIMEN QUALITY: ADEQUATE

## 2020-10-01 ENCOUNTER — Other Ambulatory Visit (HOSPITAL_COMMUNITY): Payer: Self-pay | Admitting: Gastroenterology

## 2020-10-01 ENCOUNTER — Other Ambulatory Visit: Payer: Self-pay

## 2020-10-01 MED ORDER — LEVOFLOXACIN 500 MG PO TABS: 500.0000 mg | ORAL_TABLET | Freq: Every day | ORAL | 0 refills | Status: DC

## 2020-10-01 MED ORDER — AMOXICILLIN 500 MG PO TABS: 1000.0000 mg | ORAL_TABLET | Freq: Two times a day (BID) | ORAL | 0 refills | Status: DC

## 2020-10-01 MED ORDER — PANTOPRAZOLE SODIUM 40 MG PO TBEC: 40.0000 mg | DELAYED_RELEASE_TABLET | Freq: Two times a day (BID) | ORAL | 0 refills | Status: DC

## 2021-02-24 ENCOUNTER — Other Ambulatory Visit: Payer: Self-pay | Admitting: Internal Medicine

## 2021-02-24 ENCOUNTER — Other Ambulatory Visit (INDEPENDENT_AMBULATORY_CARE_PROVIDER_SITE_OTHER): Payer: 59

## 2021-02-24 DIAGNOSIS — E538 Deficiency of other specified B group vitamins: Secondary | ICD-10-CM | POA: Diagnosis not present

## 2021-02-24 DIAGNOSIS — Z0000 Encounter for general adult medical examination without abnormal findings: Secondary | ICD-10-CM

## 2021-02-24 LAB — COMPREHENSIVE METABOLIC PANEL
ALT: 9 U/L (ref 0–35)
AST: 16 U/L (ref 0–37)
Albumin: 4.1 g/dL (ref 3.5–5.2)
Alkaline Phosphatase: 72 U/L (ref 39–117)
BUN: 6 mg/dL (ref 6–23)
CO2: 30 mEq/L (ref 19–32)
Calcium: 9.1 mg/dL (ref 8.4–10.5)
Chloride: 102 mEq/L (ref 96–112)
Creatinine, Ser: 0.83 mg/dL (ref 0.40–1.20)
GFR: 88.78 mL/min (ref >60.00)
Glucose, Bld: 84 mg/dL (ref 70–99)
Potassium: 4.4 mEq/L (ref 3.5–5.1)
Sodium: 138 mEq/L (ref 135–145)
Total Bilirubin: 0.9 mg/dL (ref 0.2–1.2)
Total Protein: 7.5 g/dL (ref 6.0–8.3)

## 2021-02-24 LAB — CBC
HCT: 33 % — ABNORMAL LOW (ref 36.0–46.0)
Hemoglobin: 10.9 g/dL — ABNORMAL LOW (ref 12.0–15.0)
MCHC: 32.9 g/dL (ref 30.0–36.0)
MCV: 80.6 fl (ref 78.0–100.0)
Platelets: 294 10*3/uL (ref 150.0–400.0)
RBC: 4.1 Mil/uL (ref 3.87–5.11)
RDW: 14.8 % (ref 11.5–15.5)
WBC: 8.5 10*3/uL (ref 4.0–10.5)

## 2021-02-24 LAB — LIPID PANEL
Cholesterol: 144 mg/dL (ref 0–200)
HDL: 66.4 mg/dL (ref >39.00)
LDL Cholesterol: 67 mg/dL (ref 0–99)
NonHDL: 77.11
Total CHOL/HDL Ratio: 2
Triglycerides: 52 mg/dL (ref 0.0–149.0)
VLDL: 10.4 mg/dL (ref 0.0–40.0)

## 2021-02-24 LAB — TSH: TSH: 0.84 u[IU]/mL (ref 0.35–4.50)

## 2021-02-24 LAB — VITAMIN B12: Vitamin B-12: 237 pg/mL (ref 211–911)

## 2021-02-25 LAB — URINALYSIS, ROUTINE W REFLEX MICROSCOPIC
Bilirubin Urine: NEGATIVE
Ketones, ur: NEGATIVE
Leukocytes,Ua: NEGATIVE
Nitrite: NEGATIVE
Specific Gravity, Urine: 1.015 (ref 1.000–1.030)
Total Protein, Urine: NEGATIVE
Urine Glucose: NEGATIVE
Urobilinogen, UA: 0.2 (ref 0.0–1.0)
pH: 6.5 (ref 5.0–8.0)

## 2021-03-04 ENCOUNTER — Ambulatory Visit (INDEPENDENT_AMBULATORY_CARE_PROVIDER_SITE_OTHER): Payer: 59 | Admitting: Internal Medicine

## 2021-03-04 ENCOUNTER — Other Ambulatory Visit (HOSPITAL_COMMUNITY): Payer: Self-pay

## 2021-03-04 ENCOUNTER — Encounter: Payer: Self-pay | Admitting: Internal Medicine

## 2021-03-04 ENCOUNTER — Other Ambulatory Visit: Payer: Self-pay

## 2021-03-04 VITALS — BP 124/80 | HR 67 | Temp 98.0°F | Resp 18 | Ht 69.0 in | Wt 190.4 lb

## 2021-03-04 DIAGNOSIS — E538 Deficiency of other specified B group vitamins: Secondary | ICD-10-CM | POA: Diagnosis not present

## 2021-03-04 DIAGNOSIS — Z0000 Encounter for general adult medical examination without abnormal findings: Secondary | ICD-10-CM

## 2021-03-04 DIAGNOSIS — G43109 Migraine with aura, not intractable, without status migrainosus: Secondary | ICD-10-CM | POA: Diagnosis not present

## 2021-03-04 MED ORDER — DICLOFENAC SODIUM 1 % EX GEL
2.0000 g | Freq: Four times a day (QID) | CUTANEOUS | 3 refills | Status: DC
  Filled 2021-03-04: qty 100, 12d supply, fill #0

## 2021-03-04 NOTE — Patient Instructions (Signed)
Try the voltaren for the ankles.

## 2021-03-04 NOTE — Progress Notes (Signed)
   Subjective:   Patient ID: Sylvia Fitzpatrick, female    DOB: 1981/01/04, 40 y.o.   MRN: [id number]  HPI The patient is a 40 YO female coming in for physical.   PMH, [place], social history reviewed and updated  Review of Systems  Constitutional: Negative.   HENT: Negative.    Eyes: Negative.   Respiratory:  Negative for cough, chest tightness and shortness of breath.   Cardiovascular:  Negative for chest pain, palpitations and leg swelling.  Gastrointestinal:  Negative for abdominal distention, abdominal pain, constipation, diarrhea, nausea and vomiting.  Musculoskeletal:  Positive for arthralgias.  Skin: Negative.   Neurological: Negative.   Psychiatric/Behavioral: Negative.     Objective:  Physical Exam Constitutional:      Appearance: She is well-developed.  HENT:     Head: Normocephalic and atraumatic.  Cardiovascular:     Rate and Rhythm: Normal rate and regular rhythm.  Pulmonary:     Effort: Pulmonary effort is normal. No respiratory distress.     Breath sounds: Normal breath sounds. No wheezing or rales.  Abdominal:     General: Bowel sounds are normal. There is no distension.     Palpations: Abdomen is soft.     Tenderness: There is no abdominal tenderness. There is no rebound.  Musculoskeletal:     Cervical back: Normal range of motion.  Skin:    General: Skin is warm and dry.  Neurological:     Mental Status: She is alert and oriented to person, place, and time.     Coordination: Coordination normal.    Vitals:   03/04/21 1441  BP: 124/80  Pulse: 67  Resp: 18  Temp: 98 F (36.7 C)  TempSrc: Oral  SpO2: 100%  Weight: 190 lb 6.4 oz (86.4 kg)  Height: 5\' 9"  (1.753 m)    This visit occurred during the SARS-CoV-2 public health emergency.  Safety protocols were in place, including screening questions prior to the visit, additional usage of staff PPE, and extensive cleaning of exam room while observing appropriate contact time as indicated for disinfecting  solutions.   Assessment & Plan:

## 2021-03-07 NOTE — Assessment & Plan Note (Signed)
Not as often lately. Does not need medication currently for them using otc with reasonable success.

## 2021-03-07 NOTE — Assessment & Plan Note (Signed)
Taking otc and doing well overall.

## 2021-03-07 NOTE — Assessment & Plan Note (Signed)
Flu shot yearly. Covid-19 counseled about boosters. Tetanus up to date. Colonoscopy up to date. Pap smear up to date with gyn. Counseled about sun safety and mole surveillance. Counseled about the dangers of distracted driving. Given 10 year screening recommendations.

## 2021-03-12 ENCOUNTER — Telehealth: Payer: Self-pay | Admitting: Internal Medicine

## 2021-03-12 NOTE — Telephone Encounter (Signed)
See below

## 2021-03-12 NOTE — Telephone Encounter (Signed)
   Patient calling to report painful, foul smelling, cloudy urine. Can urine test be ordered?

## 2021-03-13 ENCOUNTER — Other Ambulatory Visit (HOSPITAL_COMMUNITY): Payer: Self-pay

## 2021-03-13 MED ORDER — NITROFURANTOIN MONOHYD MACRO 100 MG PO CAPS
100.0000 mg | ORAL_CAPSULE | Freq: Two times a day (BID) | ORAL | 0 refills | Status: DC
  Filled 2021-03-13: qty 14, 7d supply, fill #0

## 2021-03-13 NOTE — Telephone Encounter (Signed)
Sent in [place]. If no improvement needs urine test then.

## 2021-08-15 ENCOUNTER — Telehealth: Payer: Self-pay | Admitting: Internal Medicine

## 2021-08-15 DIAGNOSIS — R300 Dysuria: Secondary | ICD-10-CM

## 2021-08-15 NOTE — Telephone Encounter (Signed)
See below

## 2021-08-15 NOTE — Telephone Encounter (Signed)
Order placed

## 2021-08-15 NOTE — Telephone Encounter (Signed)
Patient called in requesting a order for UA. [phone].

## 2021-08-19 ENCOUNTER — Other Ambulatory Visit (INDEPENDENT_AMBULATORY_CARE_PROVIDER_SITE_OTHER): Payer: 59

## 2021-08-19 DIAGNOSIS — R300 Dysuria: Secondary | ICD-10-CM | POA: Diagnosis not present

## 2021-08-19 LAB — URINALYSIS, ROUTINE W REFLEX MICROSCOPIC
Bilirubin Urine: NEGATIVE
Ketones, ur: NEGATIVE
Leukocytes,Ua: NEGATIVE
Nitrite: NEGATIVE
RBC / HPF: NONE SEEN (ref 0–?)
Specific Gravity, Urine: 1.02 (ref 1.000–1.030)
Total Protein, Urine: NEGATIVE
Urine Glucose: NEGATIVE
Urobilinogen, UA: 0.2 (ref 0.0–1.0)
pH: 6 (ref 5.0–8.0)

## 2021-08-19 NOTE — Telephone Encounter (Signed)
Spoke with the patient to make her aware that UA orders have been placed. No other questions or concerns.

## 2021-09-19 ENCOUNTER — Other Ambulatory Visit: Payer: 59

## 2021-09-19 DIAGNOSIS — A048 Other specified bacterial intestinal infections: Secondary | ICD-10-CM

## 2021-09-22 LAB — HELICOBACTER PYLORI  SPECIAL ANTIGEN
MICRO NUMBER:: 12868548
RESULT:: DETECTED — AB
SPECIMEN QUALITY: ADEQUATE

## 2021-09-25 ENCOUNTER — Encounter: Payer: Self-pay | Admitting: Gastroenterology

## 2021-09-25 ENCOUNTER — Other Ambulatory Visit (HOSPITAL_COMMUNITY): Payer: Self-pay

## 2021-09-25 ENCOUNTER — Other Ambulatory Visit: Payer: Self-pay

## 2021-09-25 DIAGNOSIS — A048 Other specified bacterial intestinal infections: Secondary | ICD-10-CM

## 2021-09-25 DIAGNOSIS — K2970 Gastritis, unspecified, without bleeding: Secondary | ICD-10-CM

## 2021-09-25 DIAGNOSIS — B9681 Helicobacter pylori [H. pylori] as the cause of diseases classified elsewhere: Secondary | ICD-10-CM

## 2021-09-25 MED ORDER — PANTOPRAZOLE SODIUM 40 MG PO TBEC
40.0000 mg | DELAYED_RELEASE_TABLET | Freq: Two times a day (BID) | ORAL | 0 refills | Status: DC
  Filled 2021-09-25: qty 28, 14d supply, fill #0

## 2021-09-25 MED ORDER — LEVOFLOXACIN 500 MG PO TABS
500.0000 mg | ORAL_TABLET | Freq: Every day | ORAL | 0 refills | Status: DC
  Filled 2021-09-25: qty 14, 14d supply, fill #0

## 2021-09-25 MED ORDER — TALICIA 250-12.5-10 MG PO CPDR
DELAYED_RELEASE_CAPSULE | ORAL | 0 refills | Status: DC
  Filled 2021-09-25: qty 168, 28d supply, fill #0

## 2021-09-25 MED ORDER — AMOXICILLIN 500 MG PO CAPS
1000.0000 mg | ORAL_CAPSULE | Freq: Three times a day (TID) | ORAL | 0 refills | Status: DC
  Filled 2021-09-25: qty 84, 14d supply, fill #0

## 2021-09-25 NOTE — Telephone Encounter (Signed)
APPROVAL  Medication: [employer]: UMR PA response: Approved Misc. Notes: [place]: [id number] help? Call us at [phone] Outcome Additional Information Required Member should be able to get the drug/product without a PA at this time. Drug Talicia 250-12.5-10MG  dr capsules Form [employer] ePA Form 2017 NCPDP  Pt unable to afford cost of medication. Request sent to 2018, [place] for Talicia samples. Will await her response.

## 2021-09-26 ENCOUNTER — Other Ambulatory Visit (HOSPITAL_COMMUNITY): Payer: Self-pay

## 2021-10-28 ENCOUNTER — Ambulatory Visit (INDEPENDENT_AMBULATORY_CARE_PROVIDER_SITE_OTHER): Payer: 59 | Admitting: Gastroenterology

## 2021-10-28 ENCOUNTER — Other Ambulatory Visit (INDEPENDENT_AMBULATORY_CARE_PROVIDER_SITE_OTHER): Payer: 59

## 2021-10-28 ENCOUNTER — Encounter: Payer: Self-pay | Admitting: Gastroenterology

## 2021-10-28 VITALS — BP 112/68 | HR 88 | Ht 68.0 in | Wt 185.1 lb

## 2021-10-28 DIAGNOSIS — A048 Other specified bacterial intestinal infections: Secondary | ICD-10-CM

## 2021-10-28 DIAGNOSIS — D509 Iron deficiency anemia, unspecified: Secondary | ICD-10-CM

## 2021-10-28 LAB — CBC WITH DIFFERENTIAL/PLATELET
Basophils Absolute: 0 10*3/uL (ref 0.0–0.1)
Basophils Relative: 0.5 % (ref 0.0–3.0)
Eosinophils Absolute: 0.2 10*3/uL (ref 0.0–0.7)
Eosinophils Relative: 2.1 % (ref 0.0–5.0)
HCT: 36.9 % (ref 36.0–46.0)
Hemoglobin: 12 g/dL (ref 12.0–15.0)
Lymphocytes Relative: 29.1 % (ref 12.0–46.0)
Lymphs Abs: 2.1 10*3/uL (ref 0.7–4.0)
MCHC: 32.4 g/dL (ref 30.0–36.0)
MCV: 83.2 fl (ref 78.0–100.0)
Monocytes Absolute: 0.7 10*3/uL (ref 0.1–1.0)
Monocytes Relative: 10.4 % (ref 3.0–12.0)
Neutro Abs: 4.1 10*3/uL (ref 1.4–7.7)
Neutrophils Relative %: 57.9 % (ref 43.0–77.0)
Platelets: 285 10*3/uL (ref 150.0–400.0)
RBC: 4.44 Mil/uL (ref 3.87–5.11)
RDW: 13.8 % (ref 11.5–15.5)
WBC: 7.1 10*3/uL (ref 4.0–10.5)

## 2021-10-28 LAB — IBC + FERRITIN
Ferritin: 14.8 ng/mL (ref 10.0–291.0)
Iron: 80 ug/dL (ref 42–145)
Saturation Ratios: 18.9 % — ABNORMAL LOW (ref 20.0–50.0)
TIBC: 424.2 ug/dL (ref 250.0–450.0)
Transferrin: 303 mg/dL (ref 212.0–360.0)

## 2021-10-28 NOTE — Progress Notes (Signed)
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  But follow-up testing was positive. She has now completed 14 days of Talacia with complete resolution of GI symptoms. Follow-up H pylori testing recommended. Will plan EGD if she has persistent iron deficiency anemia on labs today.  Iron deficiency anemia: May be related to H pylori and PUD. Follow-up labs today.   History of rectal pain: Not an active complaint today. No obvious source on colonoscopy.  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 No associated symptoms at this time.  Tubular adenoma on colonoscopy 07/08/2020: Surveillance recommended in 2028    PLAN: Iron panel, CBC Avoid all NSAIDs H pylori stool antigen EGD document healing of duodenal ulcer if anemia persists Surveillance colonoscopy in 2028 Follow-up if rectal pain returns   HPI: Sylvia Fitzpatrick is a 41 y.o. female last seen 09/27/20 for intermittent abdominal pain with concurrent bloating as well as evaluation for rectal spasms. Please see prior notes for full details.   Evaluation has included: - Normal abdominal ultrasound 01/18/18.  - Normal TSH. CMP - Hemoglobin 11, MCV 83.8, RDW 13.2 - Hemoglobin was normal in 2019. 11.5 in 2020 - Iron deficiency anemia with ferritin of 6.9, hemoglobin of 11.2 08/15/20 - ESR elevated at 38, CRP normal at 2.02, fecal calprotectin normal at 36 08/16/20 - EGD and colonoscopy 07/08/20.  EGD revealed duodenal diverticulum, and an H. pylori associated duodenal  ulcer.  Colonoscopy revealed 2 polyps at the hepatic flexure, one of which was a tubular adenoma, and was otherwise normal. - Hemoglobin 10.9, MCV 80.6, RDW 14.8 02/24/21  She was treated with bismuth subsalicylate, metronidazole, tetracycline, and pantoprazole for 14 days.  The treatment was complicated by severe GI upset that was treated with Zofran.  At the time of her last office visit in January 2022 she reported improvement in bloating that persisted 6 weeks after completing antiviral therapy. Follow-up testing recommended.   She felt the symptoms slowly recurring. But,  H pylori stool antigen positive 09/19/21. Talacia x 14 days prescribed.  She returns today in follow-up feeling much better after completing Talacia. In fact, GI ROS is negative.   Past Medical History:  Diagnosis Date   B12 deficiency    H. pylori infection    Headache(784.0)    Prior hx migraines   Hepatomegaly 2017   PONV (postoperative nausea and vomiting)    n/v after iv anesthetic    Past Surgical History:  Procedure Laterality Date   COLONOSCOPY     ESOPHAGOGASTRODUODENOSCOPY     MULTIPLE TOOTH EXTRACTIONS     TUBAL LIGATION Bilateral 02/07/2014   Procedure: POST PARTUM TUBAL LIGATION;  Surgeon: Donnamae Jude, MD;  Location: [facility];  Service: Gynecology;  Laterality: Bilateral;    Current Outpatient Medications  Medication Sig Dispense Refill   Multiple Vitamins-Minerals (MULTIVITAMIN ADULT PO) Take 1 tablet by mouth daily.     vitamin B-12 (CYANOCOBALAMIN) 1000 MCG tablet Take 1 tablet (1,000 mcg total) by mouth daily. 90 tablet 3   No current facility-administered medications for this visit.    Allergies as  of 10/28/2021   (No Known Allergies)    Family History  Problem Relation Age of Onset   Stroke Father        x multiple   Heart attack Father    Prostate cancer Maternal Grandfather    Colon cancer Maternal Aunt    Colon polyps Neg Hx    Stomach cancer Neg Hx    Esophageal cancer Neg  Hx       Physical Exam: General:   Alert,  well-nourished, pleasant and cooperative in NAD Head:  Normocephalic and atraumatic. Eyes:  Sclera clear, no icterus.   Conjunctiva pink. Abdomen:  Soft, no tenderness, nondistended, normal bowel sounds, no rebound or guarding. No hepatosplenomegaly.   Neurologic:  Alert and  oriented x4;  grossly nonfocal Skin:  Intact without significant lesions or rashes. Psych:  Alert and cooperative. Normal mood and affect.    Fionnuala Hemmerich L. Tarri Glenn, MD, MPH 10/28/2021, 8:40 AM

## 2021-10-28 NOTE — Patient Instructions (Addendum)
It was my pleasure to provide care to you today. Based on our discussion, I am providing you with my recommendations below:  RECOMMENDATION(S):  Labs today to follow-up on iron deficiency  H pylori stool testing next month (as previously ordered)  Will plan EGD to follow-up on your ulcer if your iron levels remain low  Colonoscopy 2028, earlier with new symptoms  LABS:   Please proceed to the basement level for lab work before leaving today. Press "B" on the elevator. The lab is located at the first door on the left as you exit the elevator.  FOLLOW UP:  I would like for you to follow up with me as needed. Please call the office at [phone] to schedule your appointment.  BMI:  If you are age 35 or older, your body mass index should be between 23-30. Your Body mass index is 28.15 kg/m. If this is out of the aforementioned range listed, please consider follow up with your Primary Care Provider.  If you are age 60 or younger, your body mass index should be between 19-25. Your Body mass index is 28.15 kg/m. If this is out of the aformentioned range listed, please consider follow up with your Primary Care Provider.   MY CHART:  The [facility] providers would like to encourage you to use [facility] to communicate with providers for non-urgent requests or questions.  Due to long hold times on the telephone, sending your provider a message by [facility] may be a faster and more efficient way to get a response.  Please allow 48 business hours for a response.  Please remember that this is for non-urgent requests.   Thank you for trusting me with your gastrointestinal care!    Thornton Park, MD, MPH

## 2021-11-07 ENCOUNTER — Other Ambulatory Visit: Payer: 59

## 2021-11-07 ENCOUNTER — Telehealth: Payer: Self-pay

## 2021-11-07 DIAGNOSIS — K2970 Gastritis, unspecified, without bleeding: Secondary | ICD-10-CM | POA: Diagnosis not present

## 2021-11-07 DIAGNOSIS — A048 Other specified bacterial intestinal infections: Secondary | ICD-10-CM | POA: Diagnosis not present

## 2021-11-07 DIAGNOSIS — B9681 Helicobacter pylori [H. pylori] as the cause of diseases classified elsewhere: Secondary | ICD-10-CM

## 2021-11-07 NOTE — Telephone Encounter (Signed)
Results reviewed. They had not previously been sent to me for review. I sent the patient a MyChart message attached to the labs. Thanks for your help.  ?

## 2021-11-07 NOTE — Telephone Encounter (Signed)
She needs to continue iron supplements and schedule an EGD. Okay to overbook for a 7:30 appointment to facilitate her procedure given the current wait times. ? ?

## 2021-11-07 NOTE — Telephone Encounter (Signed)
Called pt with reminder to complete her stool study. Pt inquired as follows: ? ?"What about my other labs" ? ?Advised the only orders placed by Dr. Tarri Glenn was a stool study. Further advised that it appears labs were ordered by Dr. Claybon Jabs, which are the same labs Dr. Tarri Glenn "recommended" be completed. Advised orders were never placed by Dr. Tarri Glenn nor would those results ever come to Dr. Tarri Glenn since she was not the ordering provider. Pt still disagreed. Advised, I will forward a message to Dr. Tarri Glenn for her to review the labs ordered by Dr. Claybon Jabs and to provide recommendations, if any. Verbalized acceptance and understanding. ?

## 2021-11-10 NOTE — Telephone Encounter (Signed)
Pt viewed My Chart message indicating "will do" ?

## 2021-11-10 NOTE — Telephone Encounter (Signed)
My Chart message not reviewed as of today. Following message sent to pt: ? ?Hi Julyssa, ?  ?Dr. Tarri Glenn reviewed Dr. Jonne Ply orders and provided the following response: ?  ?I reviewed your labs from 10/28/21. These results were not sent to me for review. So, I was not aware of them before today. Although your hemoglobin has improved, your ferritin is still very low. Given these results, I recommend that you continue to take iron supplements and we should schedule an EGD to be sure that the ulcer has healed. Please call the office to schedule your procedure at your earliest convenience. ?  ?I have not received the results of your stool sample. You will receive a separate response with recommendations once I have received them. ?  ?Thank you ? ?This MyChart message has not been read. ?

## 2021-11-10 NOTE — Telephone Encounter (Signed)
Following response was sent to pt via My Chart, attached to Dr. Jonne Ply lab orders: ? ?I reviewed your labs from 10/28/21. These results were not sent to me for review. So, I was not aware of them before today. Although your hemoglobin has improved, your ferritin is still very low. Given these results, I recommend that you continue to take iron supplements and we should schedule an EGD to be sure that the ulcer has healed. ?

## 2021-11-11 LAB — HELICOBACTER PYLORI  SPECIAL ANTIGEN
MICRO NUMBER:: 13085237
SPECIMEN QUALITY: ADEQUATE

## 2021-11-18 ENCOUNTER — Other Ambulatory Visit: Payer: Self-pay

## 2021-11-18 DIAGNOSIS — D509 Iron deficiency anemia, unspecified: Secondary | ICD-10-CM

## 2022-01-07 ENCOUNTER — Encounter: Payer: Self-pay | Admitting: Gastroenterology

## 2022-01-07 ENCOUNTER — Ambulatory Visit (AMBULATORY_SURGERY_CENTER): Payer: 59 | Admitting: Gastroenterology

## 2022-01-07 ENCOUNTER — Other Ambulatory Visit (HOSPITAL_COMMUNITY): Payer: Self-pay

## 2022-01-07 VITALS — BP 132/89 | HR 58 | Temp 97.3°F | Resp 17 | Ht 68.0 in | Wt 185.0 lb

## 2022-01-07 DIAGNOSIS — K2970 Gastritis, unspecified, without bleeding: Secondary | ICD-10-CM | POA: Diagnosis not present

## 2022-01-07 DIAGNOSIS — A048 Other specified bacterial intestinal infections: Secondary | ICD-10-CM | POA: Diagnosis not present

## 2022-01-07 DIAGNOSIS — K209 Esophagitis, unspecified without bleeding: Secondary | ICD-10-CM

## 2022-01-07 DIAGNOSIS — K2100 Gastro-esophageal reflux disease with esophagitis, without bleeding: Secondary | ICD-10-CM | POA: Diagnosis not present

## 2022-01-07 DIAGNOSIS — K2980 Duodenitis without bleeding: Secondary | ICD-10-CM

## 2022-01-07 DIAGNOSIS — K31819 Angiodysplasia of stomach and duodenum without bleeding: Secondary | ICD-10-CM | POA: Diagnosis not present

## 2022-01-07 DIAGNOSIS — K225 Diverticulum of esophagus, acquired: Secondary | ICD-10-CM

## 2022-01-07 DIAGNOSIS — D509 Iron deficiency anemia, unspecified: Secondary | ICD-10-CM

## 2022-01-07 MED ORDER — SODIUM CHLORIDE 0.9 % IV SOLN: 500.0000 mL | Freq: Once | INTRAVENOUS | Status: DC

## 2022-01-07 MED ORDER — PANTOPRAZOLE SODIUM 40 MG PO TBEC
DELAYED_RELEASE_TABLET | ORAL | 0 refills | Status: DC
  Filled 2022-01-07: qty 60, 60d supply, fill #0

## 2022-01-07 NOTE — Progress Notes (Signed)
? ?Referring Provider: Hoyt Koch, * ?Primary Care Physician:  Hoyt Koch, MD ? ?Indication for EGD:  History of duodenal ulcer, persistent anemia ? ? 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  But follow-up testing was positive. She has now completed 14 days of Talacia with complete resolution of GI symptoms. . ?  ?Iron deficiency anemia. ? ?PLAN: ?EGD in the [facility] today ? ? ?HPI: Sylvia Fitzpatrick is a 41 y.o. female presents for upper endoscopy. ? ?She has had intermittent abdominal pain with concurrent bloating as well as evaluation for rectal spasms. Please see prior notes for full details.  ?  ?Evaluation has included: ?- Normal abdominal ultrasound 01/18/18.  ?- Normal TSH. CMP ?- Hemoglobin 11, MCV 83.8, RDW 13.2 ?- Hemoglobin was normal in 2019. 11.5 in 2020 ?- Iron deficiency anemia with ferritin of 6.9, hemoglobin of 11.2 08/15/20 ?- ESR elevated at 38, CRP normal at 2.02, fecal calprotectin normal at 36 08/16/20 ?- EGD and colonoscopy 07/08/20.  EGD revealed duodenal diverticulum, and an H. pylori associated duodenal ulcer.  Colonoscopy revealed 2 polyps at the hepatic flexure, one of which was a tubular adenoma, and was otherwise normal. ?- Hemoglobin 10.9, MCV 80.6, RDW 14.8 02/24/21 ?  ?She was treated with bismuth subsalicylate, metronidazole, tetracycline, and pantoprazole for 14 days.  The treatment was complicated by severe GI upset that was treated with Zofran. ?  ?At the time of her last office visit in January 2022 she reported improvement in bloating that persisted 6 weeks after completing antiviral therapy. Follow-up testing recommended.  ?  ?She felt the symptoms slowly recurring. But,  H pylori stool antigen positive 09/19/21. Talacia x 14 days prescribed. ?  ?Recent labs showed persistent iron deficiency anemia despite negative follow-up testing for H pylori.   ? ? ?Past Medical History:  ?Diagnosis Date  ? B12 deficiency   ? H. pylori infection   ? Headache(784.0)   ? Prior hx migraines  ? Hepatomegaly 2017  ? PONV (postoperative nausea and vomiting)   ? n/v after iv anesthetic  ? ? ?Past Surgical History:  ?Procedure Laterality Date  ? COLONOSCOPY    ? ESOPHAGOGASTRODUODENOSCOPY    ? MULTIPLE TOOTH EXTRACTIONS    ? TUBAL LIGATION Bilateral 02/07/2014  ? Procedure: POST PARTUM TUBAL LIGATION;  Surgeon: Donnamae Jude, MD;  Location: [facility];  Service: Gynecology;  Laterality: Bilateral;  ? ? ?Current Outpatient Medications  ?Medication Sig Dispense Refill  ? Multiple Vitamins-Minerals (MULTIVITAMIN ADULT PO) Take 1 tablet by mouth daily.    ? vitamin B-12 (CYANOCOBALAMIN) 1000 MCG tablet Take 1 tablet (1,000 mcg total) by mouth daily. 90 tablet 3  ? ?Current Facility-Administered Medications  ?Medication Dose Route Frequency Provider Last Rate Last Admin  ? 0.9 %  sodium chloride infusion  500 mL Intravenous Once Thornton Park, MD      ? ? ?Allergies as of 01/07/2022  ? (No Known Allergies)  ? ? ?Family History  ?Problem Relation Age of Onset  ? Stroke Father   ?     x multiple  ? Heart attack Father   ? Prostate cancer Maternal Grandfather   ? Colon cancer Maternal Aunt   ? Colon polyps Neg Hx   ? Stomach cancer Neg Hx   ? Esophageal cancer Neg Hx   ? ? ? ?Physical Exam: ?General:   Alert,  well-nourished, pleasant and cooperative in NAD ?  Head:  Normocephalic and atraumatic. ?Eyes:  Sclera clear, no icterus.   Conjunctiva pink. ?Mouth:  No deformity or lesions.   ?Neck:  Supple; no masses or thyromegaly. ?Lungs:  Clear throughout to auscultation.   No wheezes. ?Heart:  Regular rate and rhythm; no murmurs. ?Abdomen:  Soft, non-tender, nondistended, normal bowel sounds, no rebound or guarding.  ?Msk:  Symmetrical. No boney deformities ?LAD: No inguinal or umbilical LAD ?Extremities:  No clubbing or edema. ?Neurologic:  Alert and  oriented x4;  grossly nonfocal ?Skin:  No  obvious rash or bruise. ?Psych:  Alert and cooperative. Normal mood and affect. ? ? ? ? ?Studies/Results: ?No results found. ? ? ? ?Teriyah Purington L. Tarri Glenn, MD, MPH ?01/07/2022, 8:08 AM ? ? ?  ?

## 2022-01-07 NOTE — Progress Notes (Signed)
Report given to PACU, vss 

## 2022-01-07 NOTE — Progress Notes (Signed)
Called to room to assist during endoscopic procedure.  Patient ID and intended procedure confirmed with present staff. Received instructions for my participation in the procedure from the performing physician.  

## 2022-01-07 NOTE — Progress Notes (Signed)
9450 Robinul 0.1 mg IV given due large amount of secretions upon assessment.  MD made aware, vss  ?

## 2022-01-07 NOTE — Patient Instructions (Signed)
? ?No Aspirin,Naproxen,or other non steroidal anti inflammatory drugs  ? ?Follow GERD information given  ? ?Handout on esophagitis & gastritis also given to you today ? ? ?YOU HAD AN ENDOSCOPIC PROCEDURE TODAY AT [facility]:   Refer to the procedure report that was given to you for any specific questions about what was found during the examination.  If the procedure report does not answer your questions, please call your gastroenterologist to clarify.  If you requested that your care partner not be given the details of your procedure findings, then the procedure report has been included in a sealed envelope for you to review at your convenience later. ? ?YOU SHOULD EXPECT: Some feelings of bloating in the abdomen. Passage of more gas than usual.  Walking can help get rid of the air that was put into your GI tract during the procedure and reduce the bloating. If you had a lower endoscopy (such as a colonoscopy or flexible sigmoidoscopy) you may notice spotting of blood in your stool or on the toilet paper. If you underwent a bowel prep for your procedure, you may not have a normal bowel movement for a few days. ? ?Please Note:  You might notice some irritation and congestion in your nose or some drainage.  This is from the oxygen used during your procedure.  There is no need for concern and it should clear up in a day or so. ? ?SYMPTOMS TO REPORT IMMEDIATELY: ? ? ?Following upper endoscopy (EGD) ? Vomiting of blood or coffee ground material ? New chest pain or pain under the shoulder blades ? Painful or persistently difficult swallowing ? New shortness of breath ? Fever of 100?F or higher ? Black, tarry-looking stools ? ?For urgent or emergent issues, a gastroenterologist can be reached at any hour by calling [phone]. ?Do not use MyChart messaging for urgent concerns.  ? ? ?DIET:  We do recommend a small meal at first, but then you may proceed to your regular diet.  Drink plenty of fluids  but you should avoid alcoholic beverages for 24 hours. ? ?ACTIVITY:  You should plan to take it easy for the rest of today and you should NOT DRIVE or use heavy machinery until tomorrow (because of the sedation medicines used during the test).   ? ?FOLLOW UP: ?Our staff will call the number listed on your records 48-72 hours following your procedure to check on you and address any questions or concerns that you may have regarding the information given to you following your procedure. If we do not reach you, we will leave a message.  We will attempt to reach you two times.  During this call, we will ask if you have developed any symptoms of COVID 19. If you develop any symptoms (ie: fever, flu-like symptoms, shortness of breath, cough etc.) before then, please call [phone].  If you test positive for Covid 19 in the 2 weeks post procedure, please call and report this information to [place].   ? ?If any biopsies were taken you will be contacted by phone or by letter within the next 1-3 weeks.  Please call us at [phone] if you have not heard about the biopsies in 3 weeks.  ? ? ?SIGNATURES/CONFIDENTIALITY: ?You and/or your care partner have signed paperwork which will be entered into your electronic medical record.  These signatures attest to the fact that that the information above on your After Visit Summary has been reviewed and is understood.  Full responsibility of the confidentiality of this discharge information lies with you and/or your care-partner.  ? ? ?

## 2022-01-07 NOTE — Op Note (Signed)
[facility] ?Patient Name: Sylvia Fitzpatrick ?Procedure Date: 01/07/2022 7:47 AM ?MRN: [id number] ?Endoscopist: Thornton Park MD, MD ?Age: 41 ?Referring MD:  ?Date of Birth: 1981-08-05 ?Gender: Female ?Account #: [id number] ?Procedure:                Upper GI endoscopy ?Indications:              Unexplained iron deficiency anemia, history of H  ?                          pylori associated duodenal ulcer 2021 ?Medicines:                Monitored Anesthesia Care ?Procedure:                Pre-Anesthesia Assessment: ?                          - Prior to the procedure, a History and Physical  ?                          was performed, and patient medications and  ?                          allergies were reviewed. The patient's tolerance of  ?                          previous anesthesia was also reviewed. The risks  ?                          and benefits of the procedure and the sedation  ?                          options and risks were discussed with the patient.  ?                          All questions were answered, and informed consent  ?                          was obtained. Prior Anticoagulants: The patient has  ?                          taken no previous anticoagulant or antiplatelet  ?                          agents. ASA Grade Assessment: I - A normal, healthy  ?                          patient. After reviewing the risks and benefits,  ?                          the patient was deemed in satisfactory condition to  ?                          undergo the procedure. ?  After obtaining informed consent, the endoscope was  ?                          passed under direct vision. Throughout the  ?                          procedure, the patient's blood pressure, pulse, and  ?                          oxygen saturations were monitored continuously. The  ?                          GIF [id number] #[id number] was introduced through the  ?                          mouth, and advanced to the third  part of duodenum.  ?                          The upper GI endoscopy was accomplished without  ?                          difficulty. The patient tolerated the procedure  ?                          well. ?Scope In: ?Scope Out: ?Findings:                 LA Grade A (one or more mucosal breaks less than 5  ?                          mm, not extending between tops of 2 mucosal folds)  ?                          esophagitis with no bleeding was found 39 cm from  ?                          the incisors. Biopsies were taken from the distal  ?                          esophagus with a cold forceps for histology.  ?                          Estimated blood loss was minimal. ?                          Patchy extremely mild erythematous mucosa without  ?                          bleeding was found in the gastric body. Biopsies  ?                          were taken from the antrum, body, and fundus with a  ?  cold forceps for histology. Estimated blood loss  ?                          was minimal. ?                          Localized mildly erythematous mucosa without active  ?                          bleeding and with no stigmata of bleeding was found  ?                          in the duodenal bulb. This was in the location of  ?                          the ulcer seen in 2021. No ulcer present today.  ?                          Biopsies were taken with a cold forceps for  ?                          histology. Estimated blood loss was minimal. ?                          A large non-bleeding diverticulum was found in the  ?                          duodenal bulb. ?Complications:            No immediate complications. ?Estimated Blood Loss:     Estimated blood loss was minimal. ?Impression:               - LA Grade A reflux esophagitis with no bleeding.  ?                          Biopsied. ?                          - Erythematous mucosa in the gastric body. Biopsied. ?                          -  Erythematous duodenopathy. Biopsied. ?                          - Non-bleeding duodenal diverticulum. ?                          - Duodenal ulcer seen in 2021 has resolved. ?Recommendation:           - Patient has a contact number available for  ?                          emergencies. The signs and symptoms of potential  ?                          delayed complications were discussed  with the  ?                          patient. Return to normal activities tomorrow.  ?                          Written discharge instructions were provided to the  ?                          patient. ?                          - Resume previous diet. ?                          - Continue present medications. ?                          - Start pantoprazole 40 mg QAM x 8 weeks. ?                          - GERD lifestyle modifications. ?                          - Await pathology results. ?                          - No aspirin, ibuprofen, naproxen, or other  ?                          non-steroidal anti-inflammatory drugs. ?Thornton Park MD, MD ?01/07/2022 8:28:58 AM ?This report has been signed electronically. ?

## 2022-01-07 NOTE — Progress Notes (Signed)
Pt's states no medical or surgical changes since previsit or office visit. VS assessed by C.W 

## 2022-01-09 ENCOUNTER — Telehealth: Payer: Self-pay

## 2022-01-09 NOTE — Telephone Encounter (Signed)
?  Follow up Call- ? ? ?  01/07/2022  ?  7:44 AM 07/08/2020  ?  1:56 PM  ?Call back number  ?Post procedure Call Back phone  # [phone]  ?Permission to leave phone message No Yes  ?  ? ?Patient questions: ? ?Do you have a fever, pain , or abdominal swelling? No. ?Pain Score  0 * ? ?Have you tolerated food without any problems? Yes.   ? ?Have you been able to return to your normal activities? Yes.   ? ?Do you have any questions about your discharge instructions: ?Diet   No. ?Medications  No. ?Follow up visit  No. ? ?Do you have questions or concerns about your Care? No. ? ?Actions: ?* If pain score is 4 or above: ?No action needed, pain <4. ? ? ?

## 2022-01-09 NOTE — Telephone Encounter (Signed)
?  Follow up Call- ? ? ?  01/07/2022  ?  7:44 AM 07/08/2020  ?  1:56 PM  ?Call back number  ?Post procedure Call Back phone  # [phone]  ?Permission to leave phone message No Yes  ?  ? ?Post op call attempted, no answer, left WM.  ? ?

## 2022-01-12 ENCOUNTER — Encounter: Payer: Self-pay | Admitting: Gastroenterology

## 2022-03-09 ENCOUNTER — Encounter: Payer: 59 | Admitting: Internal Medicine

## 2022-03-18 ENCOUNTER — Ambulatory Visit (INDEPENDENT_AMBULATORY_CARE_PROVIDER_SITE_OTHER): Payer: 59 | Admitting: Internal Medicine

## 2022-03-18 ENCOUNTER — Encounter: Payer: Self-pay | Admitting: Internal Medicine

## 2022-03-18 VITALS — BP 120/72 | HR 72 | Resp 18 | Ht 68.0 in | Wt 180.2 lb

## 2022-03-18 DIAGNOSIS — E538 Deficiency of other specified B group vitamins: Secondary | ICD-10-CM | POA: Diagnosis not present

## 2022-03-18 DIAGNOSIS — Z0000 Encounter for general adult medical examination without abnormal findings: Secondary | ICD-10-CM | POA: Diagnosis not present

## 2022-03-18 LAB — LIPID PANEL
Cholesterol: 146 mg/dL (ref 0–200)
HDL: 62.4 mg/dL (ref >39.00)
LDL Cholesterol: 76 mg/dL (ref 0–99)
NonHDL: 83.32
Total CHOL/HDL Ratio: 2
Triglycerides: 39 mg/dL (ref 0.0–149.0)
VLDL: 7.8 mg/dL (ref 0.0–40.0)

## 2022-03-18 LAB — COMPREHENSIVE METABOLIC PANEL
ALT: 7 U/L (ref 0–35)
AST: 15 U/L (ref 0–37)
Albumin: 4.5 g/dL (ref 3.5–5.2)
Alkaline Phosphatase: 63 U/L (ref 39–117)
BUN: 5 mg/dL — ABNORMAL LOW (ref 6–23)
CO2: 28 mEq/L (ref 19–32)
Calcium: 9.7 mg/dL (ref 8.4–10.5)
Chloride: 103 mEq/L (ref 96–112)
Creatinine, Ser: 0.93 mg/dL (ref 0.40–1.20)
GFR: 76.88 mL/min (ref >60.00)
Glucose, Bld: 100 mg/dL — ABNORMAL HIGH (ref 70–99)
Potassium: 4.4 mEq/L (ref 3.5–5.1)
Sodium: 138 mEq/L (ref 135–145)
Total Bilirubin: 1.4 mg/dL — ABNORMAL HIGH (ref 0.2–1.2)
Total Protein: 8.1 g/dL (ref 6.0–8.3)

## 2022-03-18 LAB — CBC
HCT: 36.4 % (ref 36.0–46.0)
Hemoglobin: 11.7 g/dL — ABNORMAL LOW (ref 12.0–15.0)
MCHC: 32.2 g/dL (ref 30.0–36.0)
MCV: 83.7 fl (ref 78.0–100.0)
Platelets: 301 10*3/uL (ref 150.0–400.0)
RBC: 4.35 Mil/uL (ref 3.87–5.11)
RDW: 13.1 % (ref 11.5–15.5)
WBC: 8.4 10*3/uL (ref 4.0–10.5)

## 2022-03-18 LAB — VITAMIN B12: Vitamin B-12: 330 pg/mL (ref 211–911)

## 2022-03-18 NOTE — Assessment & Plan Note (Signed)
Checking B12 level and takes sporadically otc. Adjust as needed.

## 2022-03-18 NOTE — Assessment & Plan Note (Signed)
Flu shot yearly. Covid-19 counseled. Tetanus up to date. Colonoscopy up to date. Pap smear up to date. Counseled about sun safety and mole surveillance. Counseled about the dangers of distracted driving. Given 10 year screening recommendations.

## 2022-03-18 NOTE — Progress Notes (Signed)
   Subjective:   Patient ID: Sylvia Fitzpatrick, female    DOB: March 09, 1981, 41 y.o.   MRN: [id number]  HPI The patient is here for physical.  PMH, [facility], social history reviewed and updated  Review of Systems  Constitutional: Negative.   HENT: Negative.    Eyes: Negative.   Respiratory:  Negative for cough, chest tightness and shortness of breath.   Cardiovascular:  Negative for chest pain, palpitations and leg swelling.  Gastrointestinal:  Negative for abdominal distention, abdominal pain, constipation, diarrhea, nausea and vomiting.  Musculoskeletal: Negative.   Skin: Negative.   Neurological: Negative.   Psychiatric/Behavioral: Negative.      Objective:  Physical Exam Constitutional:      Appearance: She is well-developed.  HENT:     Head: Normocephalic and atraumatic.  Cardiovascular:     Rate and Rhythm: Normal rate and regular rhythm.  Pulmonary:     Effort: Pulmonary effort is normal. No respiratory distress.     Breath sounds: Normal breath sounds. No wheezing or rales.  Abdominal:     General: Bowel sounds are normal. There is no distension.     Palpations: Abdomen is soft.     Tenderness: There is no abdominal tenderness. There is no rebound.  Musculoskeletal:     Cervical back: Normal range of motion.  Skin:    General: Skin is warm and dry.  Neurological:     Mental Status: She is alert and oriented to person, place, and time.     Coordination: Coordination normal.     Vitals:   03/18/22 0842  BP: 120/72  Pulse: 72  Resp: 18  SpO2: 99%  Weight: 180 lb 3.2 oz (81.7 kg)  Height: '5\' 8"'$  (1.727 m)    Assessment & Plan:

## 2022-03-19 LAB — HIV ANTIBODY (ROUTINE TESTING W REFLEX): HIV 1&2 Ab, 4th Generation: NONREACTIVE

## 2022-03-23 LAB — GC/CHLAMYDIA PROBE AMP
Chlamydia trachomatis, NAA: NEGATIVE
Neisseria Gonorrhoeae by PCR: NEGATIVE

## 2022-04-13 ENCOUNTER — Encounter: Payer: Self-pay | Admitting: Internal Medicine

## 2022-04-13 ENCOUNTER — Encounter: Payer: Self-pay | Admitting: Family Medicine

## 2022-04-13 ENCOUNTER — Ambulatory Visit (INDEPENDENT_AMBULATORY_CARE_PROVIDER_SITE_OTHER): Payer: 59 | Admitting: Family Medicine

## 2022-04-13 ENCOUNTER — Other Ambulatory Visit (HOSPITAL_COMMUNITY): Payer: Self-pay

## 2022-04-13 VITALS — BP 118/70 | HR 71 | Temp 97.6°F | Resp 16 | Ht 68.0 in | Wt 183.4 lb

## 2022-04-13 DIAGNOSIS — R300 Dysuria: Secondary | ICD-10-CM | POA: Diagnosis not present

## 2022-04-13 LAB — POCT URINALYSIS DIPSTICK
Glucose, UA: NEGATIVE
Protein, UA: NEGATIVE
Spec Grav, UA: 1.02 (ref 1.010–1.025)
Urobilinogen, UA: 0.2 E.U./dL
pH, UA: 6.5 (ref 5.0–8.0)

## 2022-04-13 MED ORDER — CEPHALEXIN 500 MG PO CAPS
500.0000 mg | ORAL_CAPSULE | Freq: Two times a day (BID) | ORAL | 0 refills | Status: AC
  Filled 2022-04-13: qty 10, 5d supply, fill #0

## 2022-04-13 NOTE — Progress Notes (Signed)
   Subjective:    Patient ID: Sylvia Fitzpatrick, female    DOB: [birth year], 41 y.o.   MRN: [id number]  HPI Dysuria- pt reports feeling discomfort near the end of urination.  Increased urinary frequency.  + urgency.  No suprapubic or CVA tenderness.  No fevers.  No N/V.  Sxs started Saturday.  Took bath on Friday evening.  Pt reports she has had UTIs in the past and this feels similar.  No vaginal d/c   Review of Systems For ROS see HPI     Objective:   Physical Exam Vitals reviewed.  Constitutional:      General: She is not in acute distress.    Appearance: Normal appearance. She is well-developed. She is not ill-appearing.  Abdominal:     General: There is no distension.     Palpations: Abdomen is soft.     Tenderness: There is no abdominal tenderness (no suprapubic or CVA tenderness).  Skin:    General: Skin is warm and dry.  Neurological:     General: No focal deficit present.     Mental Status: She is alert and oriented to person, place, and time.  Psychiatric:        Mood and Affect: Mood normal.        Behavior: Behavior normal.        Thought Content: Thought content normal.           Assessment & Plan:  Dysuria- new.  Pt reports taking a bath Friday and then sxs starting on Saturday.  Her frequency, urgency, dysuria are all consistent w/ UTI.  As is the presence of leukocytes in her urine.  Send for cx but in the meantime tx w/ Keflex.  Pt expressed understanding and is in agreement w/ plan.

## 2022-04-13 NOTE — Patient Instructions (Signed)
Follow up as needed or as scheduled Make sure you drink PLENTY of water Start the Cephalexin twice daily to treat possible infection Call with any questions or concerns Hang in there!!!

## 2022-04-15 LAB — URINE CULTURE
MICRO NUMBER:: 13748788
SPECIMEN QUALITY:: ADEQUATE

## 2022-04-28 ENCOUNTER — Ambulatory Visit: Payer: 59 | Admitting: Emergency Medicine

## 2022-05-08 ENCOUNTER — Other Ambulatory Visit (HOSPITAL_COMMUNITY): Payer: Self-pay

## 2022-05-08 DIAGNOSIS — Z1389 Encounter for screening for other disorder: Secondary | ICD-10-CM | POA: Diagnosis not present

## 2022-05-08 DIAGNOSIS — Z130 Encounter for screening for diseases of the blood and blood-forming organs and certain disorders involving the immune mechanism: Secondary | ICD-10-CM | POA: Diagnosis not present

## 2022-05-08 DIAGNOSIS — Z1151 Encounter for screening for human papillomavirus (HPV): Secondary | ICD-10-CM | POA: Diagnosis not present

## 2022-05-08 DIAGNOSIS — Z01419 Encounter for gynecological examination (general) (routine) without abnormal findings: Secondary | ICD-10-CM | POA: Diagnosis not present

## 2022-05-08 DIAGNOSIS — Z202 Contact with and (suspected) exposure to infections with a predominantly sexual mode of transmission: Secondary | ICD-10-CM | POA: Diagnosis not present

## 2022-05-08 DIAGNOSIS — Z124 Encounter for screening for malignant neoplasm of cervix: Secondary | ICD-10-CM | POA: Diagnosis not present

## 2022-05-08 DIAGNOSIS — N760 Acute vaginitis: Secondary | ICD-10-CM | POA: Diagnosis not present

## 2022-05-08 DIAGNOSIS — N898 Other specified noninflammatory disorders of vagina: Secondary | ICD-10-CM | POA: Diagnosis not present

## 2022-05-08 MED ORDER — METRONIDAZOLE 500 MG PO TABS
ORAL_TABLET | ORAL | 0 refills | Status: DC
  Filled 2022-05-08: qty 14, 7d supply, fill #0

## 2022-05-25 ENCOUNTER — Other Ambulatory Visit (HOSPITAL_COMMUNITY): Payer: Self-pay

## 2022-05-25 MED ORDER — METRONIDAZOLE 500 MG PO TABS
500.0000 mg | ORAL_TABLET | Freq: Two times a day (BID) | ORAL | 0 refills | Status: DC
  Filled 2022-05-25: qty 14, 7d supply, fill #0

## 2022-06-19 ENCOUNTER — Other Ambulatory Visit: Payer: 59

## 2022-06-30 ENCOUNTER — Other Ambulatory Visit (HOSPITAL_COMMUNITY): Payer: Self-pay

## 2022-06-30 DIAGNOSIS — N898 Other specified noninflammatory disorders of vagina: Secondary | ICD-10-CM | POA: Diagnosis not present

## 2022-06-30 DIAGNOSIS — R1030 Lower abdominal pain, unspecified: Secondary | ICD-10-CM | POA: Diagnosis not present

## 2022-06-30 MED ORDER — METRONIDAZOLE 0.75 % VA GEL
1.0000 | Freq: Every day | VAGINAL | 0 refills | Status: DC
Start: 2022-06-30 — End: 2023-05-25
  Filled 2022-06-30: qty 70, 7d supply, fill #0

## 2022-07-04 ENCOUNTER — Ambulatory Visit (HOSPITAL_COMMUNITY)
Admission: EM | Admit: 2022-07-04 | Discharge: 2022-07-04 | Disposition: A | Discharge status: Home / Self Care | Payer: 59 | Attending: Internal Medicine | Admitting: Internal Medicine

## 2022-07-04 ENCOUNTER — Encounter (HOSPITAL_COMMUNITY): Payer: Self-pay | Admitting: Emergency Medicine

## 2022-07-04 DIAGNOSIS — N390 Urinary tract infection, site not specified: Secondary | ICD-10-CM | POA: Insufficient documentation

## 2022-07-04 LAB — POCT URINALYSIS DIPSTICK, ED / UC
Bilirubin Urine: NEGATIVE
Glucose, UA: NEGATIVE mg/dL
Ketones, ur: NEGATIVE mg/dL
Nitrite: NEGATIVE
Protein, ur: NEGATIVE mg/dL
Specific Gravity, Urine: 1.02 (ref 1.005–1.030)
Urobilinogen, UA: 0.2 mg/dL (ref 0.0–1.0)
pH: 7 (ref 5.0–8.0)

## 2022-07-04 MED ORDER — CEPHALEXIN 500 MG PO CAPS: 500.0000 mg | ORAL_CAPSULE | Freq: Two times a day (BID) | ORAL | 0 refills | Status: AC

## 2022-07-04 NOTE — ED Provider Notes (Signed)
[place]    CSN: [id number] Arrival date & time: 07/04/22  1004      History   Chief Complaint Chief Complaint  Patient presents with   Urinary Frequency    HPI Sylvia Fitzpatrick is a 41 y.o. female.   Patient presents to urgent care for evaluation of urinary frequency, dysuria, and malodorous urine for the last week.  She was seen by her OB/GYN a couple days ago where they tested her urine and found white blood cells to the urine but patient was also having vaginal symptoms at that time.  Provider recommended deferring treatment for possible UTI until urine culture came back but patient is concerned that the urine culture was never sent as she has not received update regarding presence or absence of UTI and she continues to have symptoms.  STD testing was also performed at OB/GYN visit and all of this came back negative.  She has not attempted use of any over-the-counter medications prior to arrival urgent care for symptoms.  Denies nausea, vomiting, abdominal pain, dizziness, vaginal discharge, vaginal itching, back pain, flank pain, diarrhea, constipation, and fever/chills.  Denies excessive intake of urinary irritants.   Urinary Frequency    Past Medical History:  Diagnosis Date   B12 deficiency    H. pylori infection    Headache(784.0)    Prior hx migraines   Hepatomegaly 2017   PONV (postoperative nausea and vomiting)    n/v after iv anesthetic    Patient Active Problem List   Diagnosis Date Noted   B12 deficiency 12/07/2018   Foot pain 01/19/2018   Routine general medical examination at a health care facility 12/22/2017   Migraine 08/18/2016   Arthralgia 08/18/2016    Past Surgical History:  Procedure Laterality Date   COLONOSCOPY     ESOPHAGOGASTRODUODENOSCOPY     MULTIPLE TOOTH EXTRACTIONS     TUBAL LIGATION Bilateral 02/07/2014   Procedure: POST PARTUM TUBAL LIGATION;  Surgeon: Donnamae Jude, MD;  Location: [facility];  Service: Gynecology;   Laterality: Bilateral;    OB History     Gravida  3   Para  3   Term  3   Preterm  0   AB  0   Living  3      SAB  0   IAB  0   Ectopic  0   Multiple  0   Live Births  3            Home Medications    Prior to Admission medications   Medication Sig Start Date End Date Taking? Authorizing Provider  cephALEXin (KEFLEX) 500 MG capsule Take 1 capsule (500 mg total) by mouth 2 (two) times daily for 7 days. 07/04/22 07/11/22 Yes Jamie Belger, Stasia Cavalier, FNP  metroNIDAZOLE (FLAGYL) 500 MG tablet TAKE 1 TABLET BY MOUTH EVERY (12) HOURS FOR 7 DAYS. 05/25/22     metroNIDAZOLE (METROGEL) 0.75 % vaginal gel Place 1 Applicatorful vaginally daily for 7 days 06/30/22     Multiple Vitamins-Minerals (MULTIVITAMIN ADULT PO) Take 1 tablet by mouth daily.    [provider]  pantoprazole (PROTONIX) 40 MG tablet Take 1 tablet by mouth once a day for 8 weeks 01/07/22   Thornton Park, MD  vitamin B-12 (CYANOCOBALAMIN) 1000 MCG tablet Take 1 tablet (1,000 mcg total) by mouth daily. 12/07/18   Hoyt Koch, MD    Family History Family History  Problem Relation Age of Onset   Stroke Father  x multiple   Heart attack Father    Prostate cancer Maternal Grandfather    Colon cancer Maternal Aunt    Colon polyps Neg Hx    Stomach cancer Neg Hx    Esophageal cancer Neg Hx     Social History Social History   Tobacco Use   Smoking status: Never   Smokeless tobacco: Never  Vaping Use   Vaping Use: Never used  Substance Use Topics   Alcohol use: No   Drug use: No     Allergies   Patient has no known allergies.   Review of Systems Review of Systems  Genitourinary:  Positive for frequency.  Per HPI   Physical Exam Triage Vital Signs ED Triage Vitals [07/04/22 1026]  Enc Vitals Group     BP 120/78     Pulse Rate 71     Resp 18     Temp 98.1 F (36.7 C)     Temp Source Oral     SpO2 100 %     Weight      Height      Head Circumference       Peak Flow      Pain Score 0     Pain Loc      Pain Edu?      Excl. in [place]?    No data found.  Updated Vital Signs BP 120/78 (BP Location: Right Arm)   Pulse 71   Temp 98.1 F (36.7 C) (Oral)   Resp 18   SpO2 100%   Visual Acuity Right Eye Distance:   Left Eye Distance:   Bilateral Distance:    Right Eye Near:   Left Eye Near:    Bilateral Near:     Physical Exam Vitals and nursing note reviewed.  Constitutional:      Appearance: She is not ill-appearing or toxic-appearing.  HENT:     Head: Normocephalic and atraumatic.     Right Ear: Hearing and external ear normal.     Left Ear: Hearing and external ear normal.     Nose: Nose normal.     Mouth/Throat:     Lips: Pink.     Mouth: Mucous membranes are moist.     Pharynx: No posterior oropharyngeal erythema.  Eyes:     General: Lids are normal. Vision grossly intact. Gaze aligned appropriately.     Extraocular Movements: Extraocular movements intact.     Conjunctiva/sclera: Conjunctivae normal.  Cardiovascular:     Rate and Rhythm: Normal rate and regular rhythm.     Heart sounds: Normal heart sounds, S1 normal and S2 normal.  Pulmonary:     Effort: Pulmonary effort is normal. No respiratory distress.     Breath sounds: Normal breath sounds and air entry.  Abdominal:     General: Bowel sounds are normal.     Palpations: Abdomen is soft.     Tenderness: There is no abdominal tenderness. There is no right CVA tenderness, left CVA tenderness or guarding.  Musculoskeletal:     Cervical back: Neck supple.  Skin:    General: Skin is warm and dry.     Capillary Refill: Capillary refill takes less than 2 seconds.     Findings: No rash.  Neurological:     General: No focal deficit present.     Mental Status: She is alert and oriented to person, place, and time. Mental status is at baseline.     Cranial Nerves: No dysarthria or facial asymmetry.  Psychiatric:        Mood and Affect: Mood normal.        Speech: Speech  normal.        Behavior: Behavior normal.        Thought Content: Thought content normal.        Judgment: Judgment normal.      UC Treatments / Results  Labs (all labs ordered are listed, but only abnormal results are displayed) Labs Reviewed  POCT URINALYSIS DIPSTICK, ED / UC - Abnormal; Notable for the following components:      Result Value   Hgb urine dipstick TRACE (*)    Leukocytes,Ua SMALL (*)    All other components within normal limits  URINE CULTURE  POCT URINALYSIS DIPSTICK, ED / UC    EKG   Radiology No results found.  Procedures Procedures (including critical care time)  Medications Ordered in UC Medications - No data to display  Initial Impression / Assessment and Plan / UC Course  I have reviewed the triage vital signs and the nursing notes.  Pertinent labs & imaging results that were available during my care of the patient were reviewed by me and considered in my medical decision making (see chart for details).   1. Lower urinary tract infectious disease Urinalysis is remarkable for hemoglobin and leukocytes consistent with urinary tract infection given patient's subjective symptoms.  Keflex sent to pharmacy to be taken twice daily for the next 7 days.  Patient denies allergies to antibiotics.  Advised to increase water intake to at least 64 ounces of water per day for adequate hydration.  Advised to avoid intake of urinary irritants.  Urine culture is pending and we will change antibiotic if needed based on results of culture.   Discussed physical exam and available lab work findings in clinic with patient.  Counseled patient regarding appropriate use of medications and potential side effects for all medications recommended or prescribed today. Discussed red flag signs and symptoms of worsening condition,when to call the PCP office, return to urgent care, and when to seek higher level of care in the emergency department. Patient verbalizes understanding and  agreement with plan. All questions answered. Patient discharged in stable condition.    Final Clinical Impressions(s) / UC Diagnoses   Final diagnoses:  Lower urinary tract infectious disease     Discharge Instructions      You have been diagnosed with urinary tract infection today.  I have prescribed Keflex for you to take 500 mg twice daily for the next 7 days.  If you develop diarrhea while taking this medication you may purchase an over-the-counter probiotic or eat yogurt with live active cultures.  To avoid GI upset please take this medication with food. I have sent your urine for culture to see what type of bacteria grows. We will call you if we need to change the treatment plan based on the results of your urine culture.  If you develop any new or worsening symptoms or do not improve in the next 2 to 3 days, please return.  If your symptoms are severe, please go to the emergency room.  Follow-up with your primary care provider for further evaluation and management of your symptoms as well as ongoing wellness visits.  I hope you feel better!     ED Prescriptions     Medication Sig Dispense Auth. Provider   cephALEXin (KEFLEX) 500 MG capsule Take 1 capsule (500 mg total) by mouth 2 (two) times daily for  7 days. 14 capsule Talbot Grumbling, FNP      PDMP not reviewed this encounter.   Talbot Grumbling, [place] 07/04/22 1110

## 2022-07-04 NOTE — ED Triage Notes (Signed)
Pt reports urinary frequency, malodorous urine and discomfort. States she had the urine odor for 1 week and frequency beginning yesterday.Was seen at [facility] and a urine culture was obtained but has not received results.

## 2022-07-04 NOTE — Discharge Instructions (Signed)
You have been diagnosed with urinary tract infection today.  I have prescribed Keflex for you to take 500 mg twice daily for the next 7 days.  If you develop diarrhea while taking this medication you may purchase an over-the-counter probiotic or eat yogurt with live active cultures.  To avoid GI upset please take this medication with food. I have sent your urine for culture to see what type of bacteria grows. We will call you if we need to change the treatment plan based on the results of your urine culture.  If you develop any new or worsening symptoms or do not improve in the next 2 to 3 days, please return.  If your symptoms are severe, please go to the emergency room.  Follow-up with your primary care provider for further evaluation and management of your symptoms as well as ongoing wellness visits.  I hope you feel better!

## 2022-07-06 LAB — URINE CULTURE: Culture: 20000 — AB

## 2022-07-07 ENCOUNTER — Other Ambulatory Visit (HOSPITAL_COMMUNITY): Payer: Self-pay

## 2022-07-15 ENCOUNTER — Other Ambulatory Visit (HOSPITAL_COMMUNITY): Payer: Self-pay

## 2022-07-15 MED ORDER — METRONIDAZOLE 500 MG PO TABS
500.0000 mg | ORAL_TABLET | Freq: Two times a day (BID) | ORAL | 0 refills | Status: DC
  Filled 2022-07-15: qty 14, 7d supply, fill #0

## 2022-08-11 ENCOUNTER — Encounter: Payer: Self-pay | Admitting: Internal Medicine

## 2022-08-11 ENCOUNTER — Ambulatory Visit (INDEPENDENT_AMBULATORY_CARE_PROVIDER_SITE_OTHER): Payer: 59 | Admitting: Internal Medicine

## 2022-08-11 VITALS — BP 112/80 | HR 69 | Temp 98.1°F | Ht 68.0 in | Wt 183.0 lb

## 2022-08-11 DIAGNOSIS — R21 Rash and other nonspecific skin eruption: Secondary | ICD-10-CM | POA: Diagnosis not present

## 2022-08-11 MED ORDER — METHYLPREDNISOLONE ACETATE 40 MG/ML IJ SUSP
40.0000 mg | Freq: Once | INTRAMUSCULAR | Status: AC
  Administered 2022-08-11: 40 mg via INTRAMUSCULAR

## 2022-08-11 NOTE — Assessment & Plan Note (Signed)
Given depo-medrol 40 mg IM to disrupt histamine reaction. Advised to use zyrtec otc 1 pill 3 times a day and benadryl at night time for itching.

## 2022-08-11 NOTE — Patient Instructions (Signed)
We have given you the steroid shot today.   It is okay to take zyrtec up to 1 pill 3 times a day for itching and then benadryl at night time.

## 2022-08-11 NOTE — Progress Notes (Signed)
   Subjective:   Patient ID: Sylvia Fitzpatrick, female    DOB: 1981/01/31, 41 y.o.   MRN: [id number]  HPI The patient is a 41 YO female coming in for bug bite and itching.   Review of Systems  Constitutional: Negative.   HENT: Negative.    Eyes: Negative.   Respiratory:  Negative for cough, chest tightness and shortness of breath.   Cardiovascular:  Negative for chest pain, palpitations and leg swelling.  Gastrointestinal:  Negative for abdominal distention, abdominal pain, constipation, diarrhea, nausea and vomiting.  Musculoskeletal: Negative.   Skin:  Positive for rash.  Neurological: Negative.   Psychiatric/Behavioral: Negative.      Objective:  Physical Exam Constitutional:      Appearance: She is well-developed.  HENT:     Head: Normocephalic and atraumatic.  Cardiovascular:     Rate and Rhythm: Normal rate and regular rhythm.     Comments: Right right upper chest wall with violaceous small bumps about 1 cm circular Pulmonary:     Effort: Pulmonary effort is normal. No respiratory distress.     Breath sounds: Normal breath sounds. No wheezing or rales.  Abdominal:     General: Bowel sounds are normal. There is no distension.     Palpations: Abdomen is soft.     Tenderness: There is no abdominal tenderness. There is no rebound.  Musculoskeletal:     Cervical back: Normal range of motion.  Skin:    General: Skin is warm and dry.  Neurological:     Mental Status: She is alert and oriented to person, place, and time.     Coordination: Coordination normal.     Vitals:   08/11/22 1333  BP: 112/80  Pulse: 69  Temp: 98.1 F (36.7 C)  TempSrc: Oral  SpO2: 99%  Weight: 183 lb (83 kg)  Height: '5\' 8"'$  (1.727 m)    Assessment & Plan:  Depo-medrol 40 mg IM given at visit

## 2022-08-12 ENCOUNTER — Encounter: Payer: Self-pay | Admitting: Internal Medicine

## 2022-08-24 ENCOUNTER — Encounter: Payer: Self-pay | Admitting: Internal Medicine

## 2022-11-18 ENCOUNTER — Encounter: Payer: Self-pay | Admitting: Internal Medicine

## 2022-11-18 DIAGNOSIS — E559 Vitamin D deficiency, unspecified: Secondary | ICD-10-CM

## 2022-11-18 DIAGNOSIS — E538 Deficiency of other specified B group vitamins: Secondary | ICD-10-CM

## 2022-11-19 ENCOUNTER — Other Ambulatory Visit (INDEPENDENT_AMBULATORY_CARE_PROVIDER_SITE_OTHER): Payer: Commercial Managed Care - PPO

## 2022-11-19 DIAGNOSIS — E538 Deficiency of other specified B group vitamins: Secondary | ICD-10-CM | POA: Diagnosis not present

## 2022-11-19 DIAGNOSIS — E559 Vitamin D deficiency, unspecified: Secondary | ICD-10-CM | POA: Diagnosis not present

## 2022-11-19 NOTE — Addendum Note (Signed)
Addended by: Pricilla Holm A on: 11/19/2022 11:37 AM   Modules accepted: Orders

## 2022-11-20 LAB — VITAMIN B12: Vitamin B-12: 161 pg/mL — ABNORMAL LOW (ref 211–911)

## 2022-11-20 LAB — VITAMIN D 25 HYDROXY (VIT D DEFICIENCY, FRACTURES): VITD: 11.5 ng/mL — ABNORMAL LOW (ref 30.00–100.00)

## 2022-11-20 MED ORDER — VITAMIN D (ERGOCALCIFEROL) 1.25 MG (50000 UNIT) PO CAPS
50000.0000 [IU] | ORAL_CAPSULE | ORAL | 0 refills | Status: DC
  Filled 2022-11-20: qty 12, 84d supply, fill #0

## 2022-11-20 NOTE — Telephone Encounter (Signed)
Pt reviewed labs but not MD../lmb

## 2022-11-20 NOTE — Addendum Note (Signed)
Addended by: Pricilla Holm A on: 11/20/2022 10:30 PM   Modules accepted: Orders

## 2022-11-21 ENCOUNTER — Other Ambulatory Visit (HOSPITAL_COMMUNITY): Payer: Self-pay

## 2022-11-27 ENCOUNTER — Ambulatory Visit (INDEPENDENT_AMBULATORY_CARE_PROVIDER_SITE_OTHER): Payer: Commercial Managed Care - PPO

## 2022-11-27 DIAGNOSIS — E538 Deficiency of other specified B group vitamins: Secondary | ICD-10-CM | POA: Diagnosis not present

## 2022-11-27 MED ORDER — CYANOCOBALAMIN 1000 MCG/ML IJ SOLN
1000.0000 ug | Freq: Once | INTRAMUSCULAR | Status: AC
  Administered 2022-11-27: 1000 ug via INTRAMUSCULAR

## 2022-11-27 NOTE — Progress Notes (Addendum)
Pt was given B12 w/o any complication. 

## 2022-12-11 ENCOUNTER — Ambulatory Visit (INDEPENDENT_AMBULATORY_CARE_PROVIDER_SITE_OTHER): Payer: Commercial Managed Care - PPO | Admitting: Radiology

## 2022-12-11 DIAGNOSIS — E538 Deficiency of other specified B group vitamins: Secondary | ICD-10-CM | POA: Diagnosis not present

## 2022-12-11 MED ORDER — CYANOCOBALAMIN 1000 MCG/ML IJ SOLN
1000.0000 ug | Freq: Once | INTRAMUSCULAR | Status: AC
  Administered 2022-12-11: 1000 ug via INTRAMUSCULAR

## 2022-12-11 NOTE — Progress Notes (Signed)
Pt here to rec' B12 injections and has tolerated it well

## 2023-01-22 ENCOUNTER — Other Ambulatory Visit (INDEPENDENT_AMBULATORY_CARE_PROVIDER_SITE_OTHER): Payer: Commercial Managed Care - PPO

## 2023-01-22 ENCOUNTER — Other Ambulatory Visit: Payer: Self-pay | Admitting: Internal Medicine

## 2023-01-22 ENCOUNTER — Other Ambulatory Visit (HOSPITAL_COMMUNITY): Payer: Self-pay

## 2023-01-22 ENCOUNTER — Encounter: Payer: Self-pay | Admitting: Internal Medicine

## 2023-01-22 DIAGNOSIS — R350 Frequency of micturition: Secondary | ICD-10-CM | POA: Diagnosis not present

## 2023-01-22 LAB — URINALYSIS, ROUTINE W REFLEX MICROSCOPIC
Bilirubin Urine: NEGATIVE
Hgb urine dipstick: NEGATIVE
Ketones, ur: NEGATIVE
Nitrite: NEGATIVE
Specific Gravity, Urine: 1.01 (ref 1.000–1.030)
Total Protein, Urine: NEGATIVE
Urine Glucose: NEGATIVE
Urobilinogen, UA: 0.2 (ref 0.0–1.0)
pH: 6 (ref 5.0–8.0)

## 2023-01-22 MED ORDER — NITROFURANTOIN MONOHYD MACRO 100 MG PO CAPS
100.0000 mg | ORAL_CAPSULE | Freq: Two times a day (BID) | ORAL | 0 refills | Status: DC
  Filled 2023-01-22: qty 14, 7d supply, fill #0

## 2023-02-04 ENCOUNTER — Other Ambulatory Visit: Payer: Commercial Managed Care - PPO

## 2023-02-04 ENCOUNTER — Other Ambulatory Visit (HOSPITAL_COMMUNITY): Payer: Self-pay

## 2023-02-04 ENCOUNTER — Ambulatory Visit (HOSPITAL_BASED_OUTPATIENT_CLINIC_OR_DEPARTMENT_OTHER)
Admission: RE | Admit: 2023-02-04 | Discharge: 2023-02-04 | Disposition: A | Discharge status: Home / Self Care | Payer: Commercial Managed Care - PPO | Source: Ambulatory Visit | Attending: Family Medicine | Admitting: Family Medicine

## 2023-02-04 ENCOUNTER — Encounter: Payer: Self-pay | Admitting: Family Medicine

## 2023-02-04 ENCOUNTER — Ambulatory Visit (INDEPENDENT_AMBULATORY_CARE_PROVIDER_SITE_OTHER): Payer: Commercial Managed Care - PPO

## 2023-02-04 ENCOUNTER — Ambulatory Visit: Payer: Commercial Managed Care - PPO | Admitting: Family Medicine

## 2023-02-04 ENCOUNTER — Encounter (HOSPITAL_BASED_OUTPATIENT_CLINIC_OR_DEPARTMENT_OTHER): Payer: Self-pay

## 2023-02-04 VITALS — BP 128/88 | HR 74 | Ht 68.0 in | Wt 202.0 lb

## 2023-02-04 DIAGNOSIS — R109 Unspecified abdominal pain: Secondary | ICD-10-CM | POA: Insufficient documentation

## 2023-02-04 DIAGNOSIS — M545 Low back pain, unspecified: Secondary | ICD-10-CM | POA: Diagnosis not present

## 2023-02-04 DIAGNOSIS — R1084 Generalized abdominal pain: Secondary | ICD-10-CM

## 2023-02-04 DIAGNOSIS — M2550 Pain in unspecified joint: Secondary | ICD-10-CM

## 2023-02-04 DIAGNOSIS — R1030 Lower abdominal pain, unspecified: Secondary | ICD-10-CM | POA: Diagnosis not present

## 2023-02-04 MED ORDER — PREDNISONE 50 MG PO TABS
50.0000 mg | ORAL_TABLET | Freq: Every day | ORAL | 0 refills | Status: DC
  Filled 2023-02-04: qty 5, 5d supply, fill #0

## 2023-02-04 MED ORDER — METHYLPREDNISOLONE ACETATE 80 MG/ML IJ SUSP
80.0000 mg | Freq: Once | INTRAMUSCULAR | Status: AC
  Administered 2023-02-04: 80 mg via INTRAMUSCULAR

## 2023-02-04 MED ORDER — CEPHALEXIN 500 MG PO CAPS
500.0000 mg | ORAL_CAPSULE | Freq: Two times a day (BID) | ORAL | 0 refills | Status: DC
  Filled 2023-02-04: qty 14, 7d supply, fill #0

## 2023-02-04 MED ORDER — KETOROLAC TROMETHAMINE 60 MG/2ML IM SOLN
60.0000 mg | Freq: Once | INTRAMUSCULAR | Status: AC
  Administered 2023-02-04: 60 mg via INTRAMUSCULAR

## 2023-02-04 NOTE — Progress Notes (Signed)
[facility] [address] [place] [address] Phone: [phone] Subjective:   Bruce Donath, am serving as a scribe for Dr. Antoine Primas.  I'm seeing this patient by the request  of:  Myrlene Broker, MD  CC: Acute low back pain  [id number]  Sylvia Fitzpatrick is a 42 y.o. female coming in with complaint of lumbar spine pain since Saturday. Worsened yesterday when she bent down to get something. Pain in both sides of spine but today on right side more than left.  Pain radiates around hip and into the groin. Denies any radiating symptoms more distally. Tried muscle relaxer but this did not help.  Having severe amount of pain that is affecting daily duties.  Patient states any movement seems to make it worse but sometimes can give her almost a colicky sensation with sitting.    Past Medical History:  Diagnosis Date   B12 deficiency    H. pylori infection    Headache(784.0)    Prior hx migraines   Hepatomegaly 2017   PONV (postoperative nausea and vomiting)    n/v after iv anesthetic   Past Surgical History:  Procedure Laterality Date   COLONOSCOPY     ESOPHAGOGASTRODUODENOSCOPY     MULTIPLE TOOTH EXTRACTIONS     TUBAL LIGATION Bilateral 02/07/2014   Procedure: POST PARTUM TUBAL LIGATION;  Surgeon: Reva Bores, MD;  Location: [facility];  Service: Gynecology;  Laterality: Bilateral;   Social History   Socioeconomic History   Marital status: Single    Spouse name: Not on file   Number of children: 3   Years of education: Not on file   Highest education level: Not on file  Occupational History   Occupation: Phebotimist    Employer: [employer]  Tobacco Use   Smoking status: Never   Smokeless tobacco: Never  Vaping Use   Vaping Use: Never used  Substance and Sexual Activity   Alcohol use: No   Drug use: No   Sexual activity: Yes    Birth control/protection: Surgical  Other Topics Concern   Not on file  Social History  Narrative   Not on file   Social Determinants of Health   Financial Resource Strain: Not on file  Food Insecurity: Not on file  Transportation Needs: Not on file  Physical Activity: Not on file  Stress: Not on file  Social Connections: Not on file   No Known Allergies Family History  Problem Relation Age of Onset   Stroke Father        x multiple   Heart attack Father    Prostate cancer Maternal Grandfather    Colon cancer Maternal Aunt    Colon polyps Neg Hx    Stomach cancer Neg Hx    Esophageal cancer Neg Hx         Current Outpatient Medications (Hematological):    vitamin B-12 (CYANOCOBALAMIN) 1000 MCG tablet, Take 1 tablet (1,000 mcg total) by mouth daily.  Current Outpatient Medications (Other):    metroNIDAZOLE (FLAGYL) 500 MG tablet, Take 1 tablet (500 mg total) by mouth every 12 (twelve) hours.   metroNIDAZOLE (METROGEL) 0.75 % vaginal gel, Place 1 Applicatorful vaginally daily for 7 days   Multiple Vitamins-Minerals (MULTIVITAMIN ADULT PO), Take 1 tablet by mouth daily.   nitrofurantoin, macrocrystal-monohydrate, (MACROBID) 100 MG capsule, Take 1 capsule (100 mg total) by mouth 2 (two) times daily.   pantoprazole (PROTONIX) 40 MG tablet, Take 1 tablet by mouth  once a day for 8 weeks   Vitamin D, Ergocalciferol, (DRISDOL) 1.25 MG (50000 UNIT) CAPS capsule, Take 1 capsule (50,000 Units total) by mouth every 7 (seven) days.   Reviewed prior external information including notes and imaging from  primary care provider As well as notes that were available from care everywhere and other healthcare systems.  Past medical history, social, surgical and family history all reviewed in electronic medical record.  No pertanent information unless stated regarding to the chief complaint.   Review of Systems:  No headache, visual changes, nausea, vomiting, diarrhea, constipation, dizziness, abdominal pain, skin rash, fevers, chills, night sweats, weight loss, swollen lymph  nodes, body aches, joint swelling, chest pain, shortness of breath, mood changes. POSITIVE muscle aches  Objective  Blood pressure 128/88, pulse 74, height 5\' 8"  (1.727 m), weight 202 lb (91.6 kg), SpO2 98 %.   General: No apparent distress alert and oriented x3 mood and affect normal, dressed appropriately.  HEENT: Pupils equal, extraocular movements intact  Respiratory: Patient's speak in full sentences and does not appear short of breath  Cardiovascular: No lower extremity edema, non tender, no erythema  Patient does have severe tenderness to palpation in the lower back.  Positive CVA tenderness noted.  Patient is uncomfortable with any type of movement as well.    Impression and Recommendations:     The above documentation has been reviewed and is accurate and complete Judi Saa, DO

## 2023-02-04 NOTE — Assessment & Plan Note (Signed)
Patient does have low back pain.  X-rays do show a calcific changes potentially in the right kidney but could be consistent with a renal stone.  Due to the amount of severe tenderness CVA do feel that a possible CT imaging is necessary.  We did discuss laboratory workup but at this point we will send patient to the emergency room further evaluation.  Toradol and Depo-Medrol was made to help but do feel that antibiotics are going to be necessary.  Patient was treated with Macrobid for a urinary tract infection but in the also concern for the possibility of a kidney infection.  Patient understands and will travel for the imaging will also get labs and discuss further.

## 2023-02-04 NOTE — Patient Instructions (Addendum)
  Injections in backside  [facility] [address], [place], [place] [address]

## 2023-03-12 ENCOUNTER — Other Ambulatory Visit: Payer: Self-pay | Admitting: Family Medicine

## 2023-03-16 ENCOUNTER — Other Ambulatory Visit (HOSPITAL_COMMUNITY): Payer: Self-pay

## 2023-03-16 MED ORDER — CEPHALEXIN 500 MG PO CAPS
500.0000 mg | ORAL_CAPSULE | Freq: Two times a day (BID) | ORAL | 0 refills | Status: DC
  Filled 2023-03-16: qty 14, 7d supply, fill #0

## 2023-05-25 ENCOUNTER — Encounter: Payer: Self-pay | Admitting: Internal Medicine

## 2023-05-25 ENCOUNTER — Other Ambulatory Visit (HOSPITAL_COMMUNITY): Payer: Self-pay

## 2023-05-25 ENCOUNTER — Ambulatory Visit (INDEPENDENT_AMBULATORY_CARE_PROVIDER_SITE_OTHER): Payer: Commercial Managed Care - PPO | Admitting: Internal Medicine

## 2023-05-25 VITALS — BP 120/68 | HR 73 | Temp 97.4°F | Ht 68.0 in | Wt 202.0 lb

## 2023-05-25 DIAGNOSIS — R300 Dysuria: Secondary | ICD-10-CM

## 2023-05-25 DIAGNOSIS — E559 Vitamin D deficiency, unspecified: Secondary | ICD-10-CM

## 2023-05-25 DIAGNOSIS — G43109 Migraine with aura, not intractable, without status migrainosus: Secondary | ICD-10-CM | POA: Diagnosis not present

## 2023-05-25 DIAGNOSIS — E538 Deficiency of other specified B group vitamins: Secondary | ICD-10-CM

## 2023-05-25 DIAGNOSIS — Z0000 Encounter for general adult medical examination without abnormal findings: Secondary | ICD-10-CM | POA: Diagnosis not present

## 2023-05-25 LAB — POCT URINALYSIS DIPSTICK
Bilirubin, UA: NEGATIVE
Blood, UA: NEGATIVE
Glucose, UA: NEGATIVE
Ketones, UA: NEGATIVE
Nitrite, UA: NEGATIVE
Protein, UA: NEGATIVE
Spec Grav, UA: 1.025 (ref 1.010–1.025)
Urobilinogen, UA: NEGATIVE U/dL — AB
pH, UA: 6 (ref 5.0–8.0)

## 2023-05-25 MED ORDER — CEPHALEXIN 500 MG PO CAPS
500.0000 mg | ORAL_CAPSULE | Freq: Two times a day (BID) | ORAL | 0 refills | Status: AC
  Filled 2023-05-25: qty 10, 5d supply, fill #0

## 2023-05-25 NOTE — Progress Notes (Signed)
Subjective:   Patient ID: Sylvia Fitzpatrick, female    DOB: [birth year], 42 y.o.   MRN: [id number]  HPI The patient is here for physical.  PMH, [facility], social history reviewed and updated  Review of Systems  Constitutional: Negative.   HENT: Negative.    Eyes: Negative.   Respiratory:  Negative for cough, chest tightness and shortness of breath.   Cardiovascular:  Negative for chest pain, palpitations and leg swelling.  Gastrointestinal:  Negative for abdominal distention, abdominal pain, constipation, diarrhea, nausea and vomiting.  Musculoskeletal: Negative.   Skin: Negative.   Neurological: Negative.   Psychiatric/Behavioral: Negative.      Objective:  Physical Exam Constitutional:      Appearance: She is well-developed.  HENT:     Head: Normocephalic and atraumatic.  Cardiovascular:     Rate and Rhythm: Normal rate and regular rhythm.  Pulmonary:     Effort: Pulmonary effort is normal. No respiratory distress.     Breath sounds: Normal breath sounds. No wheezing or rales.  Abdominal:     General: Bowel sounds are normal. There is no distension.     Palpations: Abdomen is soft.     Tenderness: There is no abdominal tenderness. There is no rebound.  Musculoskeletal:     Cervical back: Normal range of motion.  Skin:    General: Skin is warm and dry.  Neurological:     Mental Status: She is alert and oriented to person, place, and time.     Coordination: Coordination normal.     Vitals:   05/25/23 1315  BP: 120/68  Pulse: 73  Temp: (!) 97.4 F (36.3 C)  TempSrc: Oral  SpO2: 97%  Weight: 202 lb (91.6 kg)  Height: 5\' 8"  (1.727 m)    Assessment & Plan:

## 2023-05-25 NOTE — Assessment & Plan Note (Signed)
POC U/A done and consistent with infection. Rx keflex 5 day course.

## 2023-05-25 NOTE — Assessment & Plan Note (Signed)
Less often now than prior

## 2023-05-25 NOTE — Assessment & Plan Note (Signed)
Flu shot yearly. Tetanus up to date. Mammogram will get at ob/gyn has not had one yet, pap smear due at ob/gyn will get done. Counseled about sun safety and mole surveillance. Counseled about the dangers of distracted driving. Given 10 year screening recommendations.

## 2023-05-25 NOTE — Assessment & Plan Note (Signed)
Checking B12 and adjust as needed. Intermittently taking replacement.

## 2023-05-31 ENCOUNTER — Other Ambulatory Visit (HOSPITAL_COMMUNITY): Payer: Self-pay

## 2023-06-08 ENCOUNTER — Other Ambulatory Visit: Payer: Commercial Managed Care - PPO

## 2023-06-08 LAB — CBC
HCT: 33.6 % — ABNORMAL LOW (ref 36.0–46.0)
Hemoglobin: 10.8 g/dL — ABNORMAL LOW (ref 12.0–15.0)
MCHC: 32.2 g/dL (ref 30.0–36.0)
MCV: 83.5 fL (ref 78.0–100.0)
Platelets: 294 10*3/uL (ref 150.0–400.0)
RBC: 4.02 Mil/uL (ref 3.87–5.11)
RDW: 13.8 % (ref 11.5–15.5)
WBC: 8.6 10*3/uL (ref 4.0–10.5)

## 2023-06-08 LAB — LIPID PANEL
Cholesterol: 141 mg/dL (ref 0–200)
HDL: 65.4 mg/dL (ref >39.00)
LDL Cholesterol: 58 mg/dL (ref 0–99)
NonHDL: 75.94
Total CHOL/HDL Ratio: 2
Triglycerides: 92 mg/dL (ref 0.0–149.0)
VLDL: 18.4 mg/dL (ref 0.0–40.0)

## 2023-06-08 LAB — COMPREHENSIVE METABOLIC PANEL
ALT: 6 U/L (ref 0–35)
AST: 13 U/L (ref 0–37)
Albumin: 3.8 g/dL (ref 3.5–5.2)
Alkaline Phosphatase: 72 U/L (ref 39–117)
BUN: 9 mg/dL (ref 6–23)
CO2: 29 meq/L (ref 19–32)
Calcium: 8.9 mg/dL (ref 8.4–10.5)
Chloride: 103 meq/L (ref 96–112)
Creatinine, Ser: 0.87 mg/dL (ref 0.40–1.20)
GFR: 82.57 mL/min (ref >60.00)
Glucose, Bld: 83 mg/dL (ref 70–99)
Potassium: 4.2 meq/L (ref 3.5–5.1)
Sodium: 137 meq/L (ref 135–145)
Total Bilirubin: 0.6 mg/dL (ref 0.2–1.2)
Total Protein: 7 g/dL (ref 6.0–8.3)

## 2023-06-08 LAB — VITAMIN B12: Vitamin B-12: 161 pg/mL — ABNORMAL LOW (ref 211–911)

## 2023-06-08 LAB — VITAMIN D 25 HYDROXY (VIT D DEFICIENCY, FRACTURES): VITD: 17.84 ng/mL — ABNORMAL LOW (ref 30.00–100.00)

## 2023-06-16 ENCOUNTER — Other Ambulatory Visit (HOSPITAL_COMMUNITY): Payer: Self-pay

## 2023-06-16 ENCOUNTER — Other Ambulatory Visit: Payer: Self-pay | Admitting: Internal Medicine

## 2023-06-16 ENCOUNTER — Encounter: Payer: Self-pay | Admitting: Internal Medicine

## 2023-06-17 ENCOUNTER — Other Ambulatory Visit (HOSPITAL_COMMUNITY): Payer: Self-pay

## 2023-06-17 ENCOUNTER — Other Ambulatory Visit: Payer: Self-pay | Admitting: Radiology

## 2023-06-17 MED ORDER — VITAMIN D (ERGOCALCIFEROL) 1.25 MG (50000 UNIT) PO CAPS
50000.0000 [IU] | ORAL_CAPSULE | ORAL | 0 refills | Status: DC
  Filled 2023-06-17: qty 12, 84d supply, fill #0

## 2023-06-17 MED ORDER — "SYRINGE/NEEDLE (DISP) 22G X 1"" 3 ML MISC"
0 refills | Status: DC
  Filled 2023-06-17: qty 10, 90d supply, fill #0

## 2023-06-17 MED ORDER — CYANOCOBALAMIN 1000 MCG/ML IJ SOLN
1000.0000 ug | INTRAMUSCULAR | 0 refills | Status: AC
  Filled 2023-06-17: qty 4, 28d supply, fill #0

## 2023-06-18 ENCOUNTER — Other Ambulatory Visit (HOSPITAL_COMMUNITY): Payer: Self-pay

## 2023-06-21 ENCOUNTER — Ambulatory Visit: Payer: Commercial Managed Care - PPO

## 2023-07-08 ENCOUNTER — Ambulatory Visit: Payer: Commercial Managed Care - PPO | Admitting: Radiology

## 2023-07-08 DIAGNOSIS — E538 Deficiency of other specified B group vitamins: Secondary | ICD-10-CM

## 2023-07-08 MED ORDER — CYANOCOBALAMIN 1000 MCG/ML IJ SOLN
1000.0000 ug | Freq: Once | INTRAMUSCULAR | Status: AC
  Administered 2023-07-08: 1000 ug via INTRAMUSCULAR

## 2023-07-08 NOTE — Progress Notes (Signed)
Patient here for B12 injection tolerated well with no complications.

## 2023-07-15 ENCOUNTER — Ambulatory Visit: Payer: Commercial Managed Care - PPO | Admitting: Internal Medicine

## 2023-07-15 DIAGNOSIS — E538 Deficiency of other specified B group vitamins: Secondary | ICD-10-CM | POA: Diagnosis not present

## 2023-07-15 DIAGNOSIS — R7989 Other specified abnormal findings of blood chemistry: Secondary | ICD-10-CM

## 2023-07-15 MED ORDER — CYANOCOBALAMIN 1000 MCG/ML IJ SOLN
1000.0000 ug | Freq: Once | INTRAMUSCULAR | Status: AC
  Administered 2023-07-15: 1000 ug via INTRAMUSCULAR

## 2023-07-15 NOTE — Progress Notes (Addendum)
Sylvia Fitzpatrick is a 42 y.o. female presents to the office today for Cyanocobalamin injection per physician's orders. Injection was administered Intramuscular Right deltoid.   Patient's next injection due 07/22/23, appt made? yes  [name]

## 2023-07-19 ENCOUNTER — Encounter: Payer: Self-pay | Admitting: Internal Medicine

## 2023-07-19 NOTE — Telephone Encounter (Signed)
Ok to schedule.

## 2023-07-21 ENCOUNTER — Other Ambulatory Visit (INDEPENDENT_AMBULATORY_CARE_PROVIDER_SITE_OTHER): Payer: Commercial Managed Care - PPO

## 2023-07-21 ENCOUNTER — Encounter: Payer: Self-pay | Admitting: Gastroenterology

## 2023-07-21 ENCOUNTER — Ambulatory Visit (INDEPENDENT_AMBULATORY_CARE_PROVIDER_SITE_OTHER): Payer: Commercial Managed Care - PPO | Admitting: Gastroenterology

## 2023-07-21 ENCOUNTER — Ambulatory Visit (INDEPENDENT_AMBULATORY_CARE_PROVIDER_SITE_OTHER): Payer: Commercial Managed Care - PPO | Admitting: *Deleted

## 2023-07-21 VITALS — BP 120/78 | HR 91 | Ht 68.0 in | Wt 206.0 lb

## 2023-07-21 DIAGNOSIS — E538 Deficiency of other specified B group vitamins: Secondary | ICD-10-CM

## 2023-07-21 DIAGNOSIS — K625 Hemorrhage of anus and rectum: Secondary | ICD-10-CM

## 2023-07-21 DIAGNOSIS — R109 Unspecified abdominal pain: Secondary | ICD-10-CM

## 2023-07-21 DIAGNOSIS — D509 Iron deficiency anemia, unspecified: Secondary | ICD-10-CM

## 2023-07-21 DIAGNOSIS — E559 Vitamin D deficiency, unspecified: Secondary | ICD-10-CM | POA: Diagnosis not present

## 2023-07-21 DIAGNOSIS — R194 Change in bowel habit: Secondary | ICD-10-CM

## 2023-07-21 DIAGNOSIS — R1013 Epigastric pain: Secondary | ICD-10-CM | POA: Diagnosis not present

## 2023-07-21 LAB — COMPREHENSIVE METABOLIC PANEL
ALT: 7 U/L (ref 0–35)
AST: 14 U/L (ref 0–37)
Albumin: 4.4 g/dL (ref 3.5–5.2)
Alkaline Phosphatase: 73 U/L (ref 39–117)
BUN: 8 mg/dL (ref 6–23)
CO2: 27 meq/L (ref 19–32)
Calcium: 9.6 mg/dL (ref 8.4–10.5)
Chloride: 101 meq/L (ref 96–112)
Creatinine, Ser: 0.85 mg/dL (ref 0.40–1.20)
GFR: 84.84 mL/min (ref >60.00)
Glucose, Bld: 103 mg/dL — ABNORMAL HIGH (ref 70–99)
Potassium: 4.4 meq/L (ref 3.5–5.1)
Sodium: 136 meq/L (ref 135–145)
Total Bilirubin: 1.1 mg/dL (ref 0.2–1.2)
Total Protein: 8.2 g/dL (ref 6.0–8.3)

## 2023-07-21 LAB — CBC WITH DIFFERENTIAL/PLATELET
Basophils Absolute: 0 10*3/uL (ref 0.0–0.1)
Basophils Relative: 0.2 % (ref 0.0–3.0)
Eosinophils Absolute: 0 10*3/uL (ref 0.0–0.7)
Eosinophils Relative: 0.4 % (ref 0.0–5.0)
HCT: 36.1 % (ref 36.0–46.0)
Hemoglobin: 11.5 g/dL — ABNORMAL LOW (ref 12.0–15.0)
Lymphocytes Relative: 12.4 % (ref 12.0–46.0)
Lymphs Abs: 1.5 10*3/uL (ref 0.7–4.0)
MCHC: 31.7 g/dL (ref 30.0–36.0)
MCV: 83.1 fL (ref 78.0–100.0)
Monocytes Absolute: 0.6 10*3/uL (ref 0.1–1.0)
Monocytes Relative: 4.8 % (ref 3.0–12.0)
Neutro Abs: 10.1 10*3/uL — ABNORMAL HIGH (ref 1.4–7.7)
Neutrophils Relative %: 82.2 % — ABNORMAL HIGH (ref 43.0–77.0)
Platelets: 354 10*3/uL (ref 150.0–400.0)
RBC: 4.35 Mil/uL (ref 3.87–5.11)
RDW: 13.5 % (ref 11.5–15.5)
WBC: 12.3 10*3/uL — ABNORMAL HIGH (ref 4.0–10.5)

## 2023-07-21 LAB — IBC + FERRITIN
Ferritin: 13.4 ng/mL (ref 10.0–291.0)
Iron: 51 ug/dL (ref 42–145)
Saturation Ratios: 11.2 % — ABNORMAL LOW (ref 20.0–50.0)
TIBC: 453.6 ug/dL — ABNORMAL HIGH (ref 250.0–450.0)
Transferrin: 324 mg/dL (ref 212.0–360.0)

## 2023-07-21 MED ORDER — CYANOCOBALAMIN 1000 MCG/ML IJ SOLN
1000.0000 ug | Freq: Once | INTRAMUSCULAR | Status: AC
  Administered 2023-07-21: 1000 ug via INTRAMUSCULAR

## 2023-07-21 MED ORDER — IBGARD 90 MG PO CPCR: ORAL_CAPSULE | ORAL | Status: DC

## 2023-07-21 NOTE — Progress Notes (Signed)
Per orders of Dr. Okey Dupre, injection of Cyanocobalamin given by Johnella Moloney. Approval given by Elodia Florence, practice administrator to give at the [facility] office. Patient tolerated injection well.

## 2023-07-21 NOTE — Patient Instructions (Addendum)
If your blood pressure at your visit was 140/90 or greater, please contact your primary care physician to follow up on this. ______________________________________________________  If you are age 41 or older, your body mass index should be between 23-30. Your Body mass index is 31.32 kg/m. If this is out of the aforementioned range listed, please consider follow up with your Primary Care Provider.  If you are age 63 or younger, your body mass index should be between 19-25. Your Body mass index is 31.32 kg/m. If this is out of the aformentioned range listed, please consider follow up with your Primary Care Provider.  ________________________________________________________  The [place] [facility] providers would like to encourage you to use [facility] to communicate with providers for non-urgent requests or questions.  Due to long hold times on the telephone, sending your provider a message by [facility] may be a faster and more efficient way to get a response.  Please allow 48 business hours for a response.  Please remember that this is for non-urgent requests.  _______________________________________________________  Due to recent changes in healthcare laws, you may see the results of your imaging and laboratory studies on MyChart before your provider has had a chance to review them.  We understand that in some cases there may be results that are confusing or concerning to you. Not all laboratory results come back in the same time frame and the provider may be waiting for multiple results in order to interpret others.  Please give [place] 48 hours in order for your provider to thoroughly review all the results before contacting the office for clarification of your results.   We have given you samples of the following medication to take: IBgard - take as directed as needed  Please go to the lab in the basement of our building to have lab work done as you leave today. Hit "B" for basement when you get on the elevator.   When the doors open the lab is on your left.  We will call you with the results. Thank you.   Your provider has ordered "Diatherix" stool testing for you. You have received a kit from our office today containing all necessary supplies to complete this test. Please carefully read the stool collection instructions provided in the kit before opening the accompanying materials. In addition, be sure to place the label from the top right corner of the laboratory request sheet onto the "puritan opti-swab" tube that is supplied in the kit. This label should include your full name and date of birth. After completing the test, you should secure the purtian tube into the specimen biohazard bag. The laboratory request information sheet (including date and time of specimen collection) should be placed into the outside pocket of the specimen biohazard bag and returned to the [facility] lab with 2 days of collection.   If the laboratory information sheet specimen date and time are not filled out, the test will NOT be performed.   Thank you for entrusting me with your care and for choosing [facility], Dr. Ileene Patrick

## 2023-07-21 NOTE — Progress Notes (Signed)
HPI :  Sylvia Fitzpatrick is a 42 y.o. female last seen 09/27/20 for intermittent abdominal pain with concurrent bloating as well as evaluation for rectal spasms. Please see prior notes for full details.   Patient presents to clinic with a new complaint of lower abdominal cramping with passing mucousy bloody stools.  Patient states that at 430 this morning she had a solid then loose bowel movement followed by 5-6 trips to the restroom where she only passed mucus and bright red blood.  Patient reports she did have 1 day with no bowel movement prior to this episode.  She typically has 1 regular bowel movement daily.  She reports 3 out of 10 pain when stationary but when she goes to the restroom she reports 10 out of 10 pain.  Patient does report the symptoms have slowly improved over the course of the last few hours and is no longer constant.  Patient reports the only change she has made to her regimen is she started taking over-the-counter fat burner she bought on [place] she started last Friday.  She also tells me she has been fasting for the past 3 days.   Reports intermittent lightheadedness and dizziness associated with pain. Denies nausea, vomiting, fever or chills. Denies unintentional weight loss. Denies GERD symptoms, dysphagia.  Currently not taking pantoprazole, takes OTC tums prn. Denies recent travel or exposure. Denies NSAID use.  Denies alcohol or tobacco use  Prior workup: - EGD and colonoscopy 07/08/20.   EGD revealed duodenal diverticulum, and an H. pylori associated duodenal ulcer.  Colonoscopy revealed 2 polyps at the hepatic flexure, one of which was a tubular adenoma, and was otherwise normal.   She was treated with bismuth subsalicylate, metronidazole, tetracycline, and pantoprazole for 14 days.  H pylori stool antigen positive 09/19/21. [place] x 14 days prescribed.     EGD 01/07/22: - LA Grade A (one or more mucosal breaks less than 5 mm, not extending between tops of 2 mucosal  folds) esophagitis with no bleeding was found 39 cm from the incisors. Biopsies were taken from the distal esophagus with a cold forceps for histology. Estimated blood loss was minimal. Findings: - Patchy extremely mild erythematous mucosa without bleeding was found in the gastric body. Biopsies were taken from the antrum, body, and fundus with a cold forceps for histology. Estimated blood loss was minimal. - Localized mildly erythematous mucosa without active bleeding and with no stigmata of bleeding was found in the duodenal bulb. This was in the location of the ulcer seen in 2021. No ulcer present today. Biopsies were taken with a cold forceps for histology. Estimated blood loss was minimal. - A large non-bleeding diverticulum was found in the duodenal bulb.  1. Surgical [P], duodenal biopsy FRAGMENTS OF NORMAL DUODENAL MUCOSA. THERE ARE NO DIAGNOSTIC FEATURES OF CELIAC DISEASE. 2. Surgical [P], gastric antrum and gastric body FRAGMENTS OF GASTRIC MUCOSA WITH MINIMAL VASCULAR ECTASIA. H. PYLORI, INTESTINAL METAPLASIA, ATROPHY AND DYSPLASIA ARE NOT IDENTIFIED. 3. Surgical [P], distal esophagus NONSPECIFIC INFLAMMATION IN A POLYPOID FRAGMENT OF GASTRIC MUCOSA WITHOUT GOBLET CELL METAPLASIA. NONSPECIFIC INFLAMMATION IN THE ADJACENT PART OF ESOPHAGEAL MUCOSA. THERE ARE NO DIAGNOSTIC FEATURES OF BARRETT'S ESOPHAGUS AND EOSINOPHILIC ESOPHAGITIS. NEGATIVE FOR DYSPLASIA AND MALIGNANCY.    Past Medical History:  Diagnosis Date   B12 deficiency    H. pylori infection    Headache(784.0)    Prior hx migraines   Hepatomegaly 2017   PONV (postoperative nausea and vomiting)    n/v after iv  anesthetic     Past Surgical History:  Procedure Laterality Date   COLONOSCOPY     ESOPHAGOGASTRODUODENOSCOPY     MULTIPLE TOOTH EXTRACTIONS     TUBAL LIGATION Bilateral 02/07/2014   Procedure: POST PARTUM TUBAL LIGATION;  Surgeon: Reva Bores, MD;  Location: [facility];  Service: Gynecology;  Laterality:  Bilateral;   Family History  Problem Relation Age of Onset   Stroke Father        x multiple   Heart attack Father    Prostate cancer Maternal Grandfather    Colon cancer Maternal Aunt    Colon polyps Neg Hx    Stomach cancer Neg Hx    Esophageal cancer Neg Hx    Social History   Tobacco Use   Smoking status: Never   Smokeless tobacco: Never  Vaping Use   Vaping status: Never Used  Substance Use Topics   Alcohol use: No   Drug use: No   Current Outpatient Medications  Medication Sig Dispense Refill   Peppermint Oil (IBGARD) 90 MG CPCR Use as directed     pantoprazole (PROTONIX) 40 MG tablet Take 1 tablet by mouth once a day for 8 weeks (Patient not taking: Reported on 05/25/2023) 60 tablet 0   SYRINGE-NEEDLE, DISP, 3 ML 22G X 1" 3 ML MISC Use as directed (Patient not taking: Reported on 07/21/2023) 10 each 0   Vitamin D, Ergocalciferol, (DRISDOL) 1.25 MG (50000 UNIT) CAPS capsule Take 1 capsule (50,000 Units total) by mouth every 7 (seven) days. (Patient not taking: Reported on 07/21/2023) 12 capsule 0   Current Facility-Administered Medications  Medication Dose Route Frequency Provider Last Rate Last Admin   cyanocobalamin (VITAMIN B12) injection 1,000 mcg  1,000 mcg Intramuscular Once Myrlene Broker, MD       No Known Allergies   Review of Systems: All systems reviewed and negative except where noted in HPI.    No results found.  Physical Exam: BP 120/78 (BP Location: Left Arm, Patient Position: Sitting, Cuff Size: Normal)   Pulse 91   Ht 5\' 8"  (1.727 m)   Wt 206 lb (93.4 kg)   SpO2 95%   BMI 31.32 kg/m  Constitutional: Pleasant,well-developed, African American female appears to be not feeling well today HEENT: Normocephalic and atraumatic. Conjunctivae are normal. No scleral icterus. Neck supple.  Cardiovascular: Normal rate, regular rhythm.  Pulmonary/chest: Effort normal and breath sounds normal. No wheezing, rales or rhonchi. Abdominal: Soft,  nondistended, Lower abdominal tenderness with palpation. Bowel sounds active throughout. There are no masses palpable. No hepatomegaly. Extremities: no edema Lymphadenopathy: No cervical adenopathy noted. Neurological: Alert and oriented to person place and time. Skin: Skin is warm and dry. No rashes noted. Psychiatric: Normal mood and affect. Behavior is normal.  ASSESSMENT: 42 y.o. female here for assessment of the following  Lower abdominal cramping with rectal bleeding, acute most likely secondary to infectious etiology or ischemic colitis.  No known history of constipation.  No exposure or travel.  Patient does state symptoms have slowly improved over the last few hours.  Iron deficiency anemia- not on current oral iron supplementation. Most recent Hgb 10.8 (06/08/23). Reports lightheadedness and dizziness. Will recheck iron panel.  History of Helicobacter pylori.  Last stool antigen on 11/07/2021 was negative  Vitamin D deficiency, last vitamin D on 06/08/2023 was 17.84 -Patient on daily vitamin D  B12 deficiency, B12 06/08/2023 was 161 -On weekly injections  History of Tubular adenoma -Surveillance colonoscopy in 2028  PLAN: -Provide samples  of IBgard -CBC,CMET, Iron panel -GI pathogen panel (rectal swab) -Stop fat burner supplment -No NSAIDs -Advised to go to the ER if there is any severe abdominal pain, unable to hold down food/water, blood in stool or vomit, chest pain, shortness of breath, or any worsening symptoms.    Deanna May NP    See assessment and plan as outlined above per [facility] NP.  I saw this patient with Deanna and examined her myself as well and took her history.  She has been in her usual state of health and acutely this morning developed diarrhea followed by multiple episodes of scant hematochezia with abdominal cramping.  No fevers, no recent abnormal food ingestions or sick contacts.  She has started a new "fat burner supplement" over-the-counter within  the past 2 weeks.  The symptoms are very new for her.  She has had a colonoscopy in recent years which showed nothing concerning.  Discussed differential diagnosis with her which includes ischemic colitis versus infectious colitis versus other.  Her abdomen is soft / benign and she states since symptoms started they appear to be improving.  I wonder if she may have had mild ischemic colitis causing this in the setting of supplement use, some of these fat burner/supplements have been associated with this.  I recommend she completely stop that.  I asked her to go to the lab for basic blood work and we sent stool Diatherix swab for GI pathogen panel to exclude infection.  Hopefully this resolves without intervention over the next 24 to 48 hours.  I counseled her that if she has severe abdominal pain that worsens, significant bleeding etc., then she needs to go to the ED for imaging and further evaluation and possible admission.  The bleeding seems very scant in regards to what we witnessed her put out in sample cup in the office.  We will have her go to the lab for basic blood work to make sure stable.  She already seems to be feeling better, hopefully that continues again, if worsening she needs to let us know or go to the ED for further evaluation.  If she has any persistence of this over time we will need to do flex sig to further evaluate.  Harlin Rain, MD [facility]

## 2023-07-23 ENCOUNTER — Ambulatory Visit: Payer: Commercial Managed Care - PPO

## 2023-07-29 ENCOUNTER — Encounter: Payer: Self-pay | Admitting: Gastroenterology

## 2023-07-30 ENCOUNTER — Ambulatory Visit (INDEPENDENT_AMBULATORY_CARE_PROVIDER_SITE_OTHER): Payer: Commercial Managed Care - PPO

## 2023-07-30 DIAGNOSIS — E538 Deficiency of other specified B group vitamins: Secondary | ICD-10-CM | POA: Diagnosis not present

## 2023-07-30 MED ORDER — CYANOCOBALAMIN 1000 MCG/ML IJ SOLN
1000.0000 ug | Freq: Once | INTRAMUSCULAR | Status: AC
  Administered 2023-07-30: 1000 ug via INTRAMUSCULAR

## 2023-07-30 NOTE — Progress Notes (Signed)
Pt here for monthly B12 injection per Dr. Lawerance Bach   B12 given IM and pt tolerated injection well.  Patient advised to report to the office immediately if she notices any adverse reactions. Patient gave a verbal understanding.

## 2023-10-11 ENCOUNTER — Telehealth: Payer: Self-pay

## 2023-10-11 DIAGNOSIS — D509 Iron deficiency anemia, unspecified: Secondary | ICD-10-CM

## 2023-10-11 NOTE — Telephone Encounter (Signed)
MyChart message to patient to go to the lab 

## 2023-10-11 NOTE — Telephone Encounter (Signed)
-----   Message from [facility] [place] J sent at 07/21/2023  4:26 PM EST ----- Contact her about getting labs drawn in February 2025: TIBC/ferritin, and CBC. Orders have not been put in yet.

## 2023-10-14 ENCOUNTER — Other Ambulatory Visit (INDEPENDENT_AMBULATORY_CARE_PROVIDER_SITE_OTHER): Payer: Commercial Managed Care - PPO

## 2023-10-14 DIAGNOSIS — D509 Iron deficiency anemia, unspecified: Secondary | ICD-10-CM | POA: Diagnosis not present

## 2023-10-14 NOTE — Telephone Encounter (Signed)
 Called and spoke to patient.  She understands she needs to have blood work done.

## 2023-10-15 LAB — IBC + FERRITIN
Ferritin: 8.6 ng/mL — ABNORMAL LOW (ref 10.0–291.0)
Iron: 40 ug/dL — ABNORMAL LOW (ref 42–145)
Saturation Ratios: 9.7 % — ABNORMAL LOW (ref 20.0–50.0)
TIBC: 413 ug/dL (ref 250.0–450.0)
Transferrin: 295 mg/dL (ref 212.0–360.0)

## 2023-10-15 LAB — CBC WITH DIFFERENTIAL/PLATELET
Basophils Absolute: 0.1 10*3/uL (ref 0.0–0.1)
Basophils Relative: 1.1 % (ref 0.0–3.0)
Eosinophils Absolute: 0.2 10*3/uL (ref 0.0–0.7)
Eosinophils Relative: 1.8 % (ref 0.0–5.0)
HCT: 34.6 % — ABNORMAL LOW (ref 36.0–46.0)
Hemoglobin: 11.3 g/dL — ABNORMAL LOW (ref 12.0–15.0)
Lymphocytes Relative: 31.7 % (ref 12.0–46.0)
Lymphs Abs: 3.1 10*3/uL (ref 0.7–4.0)
MCHC: 32.5 g/dL (ref 30.0–36.0)
MCV: 83.1 fL (ref 78.0–100.0)
Monocytes Absolute: 0.9 10*3/uL (ref 0.1–1.0)
Monocytes Relative: 9.5 % (ref 3.0–12.0)
Neutro Abs: 5.5 10*3/uL (ref 1.4–7.7)
Neutrophils Relative %: 55.9 % (ref 43.0–77.0)
Platelets: 358 10*3/uL (ref 150.0–400.0)
RBC: 4.17 Mil/uL (ref 3.87–5.11)
RDW: 14.1 % (ref 11.5–15.5)
WBC: 9.9 10*3/uL (ref 4.0–10.5)

## 2023-11-23 ENCOUNTER — Encounter: Payer: Self-pay | Admitting: Internal Medicine

## 2023-11-26 ENCOUNTER — Other Ambulatory Visit: Payer: Self-pay

## 2023-11-26 ENCOUNTER — Other Ambulatory Visit (HOSPITAL_COMMUNITY): Payer: Self-pay

## 2023-11-26 ENCOUNTER — Ambulatory Visit: Admitting: Internal Medicine

## 2023-11-26 ENCOUNTER — Encounter: Payer: Self-pay | Admitting: Internal Medicine

## 2023-11-26 VITALS — BP 118/70 | HR 80 | Temp 98.0°F | Ht 68.0 in | Wt 204.0 lb

## 2023-11-26 DIAGNOSIS — N3000 Acute cystitis without hematuria: Secondary | ICD-10-CM | POA: Diagnosis not present

## 2023-11-26 DIAGNOSIS — R300 Dysuria: Secondary | ICD-10-CM | POA: Diagnosis not present

## 2023-11-26 LAB — POC URINALSYSI DIPSTICK (AUTOMATED)
Bilirubin, UA: NEGATIVE
Blood, UA: NEGATIVE
Glucose, UA: NEGATIVE
Ketones, UA: NEGATIVE
Nitrite, UA: NEGATIVE
Protein, UA: NEGATIVE
Spec Grav, UA: 1.015 (ref 1.010–1.025)
Urobilinogen, UA: 0.2 U/dL
pH, UA: 7.5 (ref 5.0–8.0)

## 2023-11-26 MED ORDER — SULFAMETHOXAZOLE-TRIMETHOPRIM 800-160 MG PO TABS
1.0000 | ORAL_TABLET | Freq: Two times a day (BID) | ORAL | 0 refills | Status: AC
  Filled 2023-11-26: qty 10, 5d supply, fill #0

## 2023-11-26 NOTE — Patient Instructions (Signed)
Take the antibiotic as prescribed.  Take tylenol if needed.     Increase your water intake.   Call if no improvement     Urinary Tract Infection, Adult A urinary tract infection (UTI) is an infection of any part of the urinary tract, which includes the kidneys, ureters, bladder, and urethra. These organs make, store, and get rid of urine in the body. UTI can be a bladder infection (cystitis) or kidney infection (pyelonephritis). What are the causes? This infection may be caused by fungi, viruses, or bacteria. Bacteria are the most common cause of UTIs. This condition can also be caused by repeated incomplete emptying of the bladder during urination. What increases the risk? This condition is more likely to develop if:  You ignore your need to urinate or hold urine for long periods of time.  You do not empty your bladder completely during urination.  You wipe back to front after urinating or having a bowel movement, if you are female.  You are uncircumcised, if you are female.  You are constipated.  You have a urinary catheter that stays in place (indwelling).  You have a weak defense (immune) system.  You have a medical condition that affects your bowels, kidneys, or bladder.  You have diabetes.  You take antibiotic medicines frequently or for long periods of time, and the antibiotics no longer work well against certain types of infections (antibiotic resistance).  You take medicines that irritate your urinary tract.  You are exposed to chemicals that irritate your urinary tract.  You are female.  What are the signs or symptoms? Symptoms of this condition include:  Fever.  Frequent urination or passing small amounts of urine frequently.  Needing to urinate urgently.  Pain or burning with urination.  Urine that smells bad or unusual.  Cloudy urine.  Pain in the lower abdomen or back.  Trouble urinating.  Blood in the urine.  Vomiting or being less hungry than  normal.  Diarrhea or abdominal pain.  Vaginal discharge, if you are female.  How is this diagnosed? This condition is diagnosed with a medical history and physical exam. You will also need to provide a urine sample to test your urine. Other tests may be done, including:  Blood tests.  Sexually transmitted disease (STD) testing.  If you have had more than one UTI, a cystoscopy or imaging studies may be done to determine the cause of the infections. How is this treated? Treatment for this condition often includes a combination of two or more of the following:  Antibiotic medicine.  Other medicines to treat less common causes of UTI.  Over-the-counter medicines to treat pain.  Drinking enough water to stay hydrated.  Follow these instructions at home:  Take over-the-counter and prescription medicines only as told by your health care provider.  If you were prescribed an antibiotic, take it as told by your health care provider. Do not stop taking the antibiotic even if you start to feel better.  Avoid alcohol, caffeine, tea, and carbonated beverages. They can irritate your bladder.  Drink enough fluid to keep your urine clear or pale yellow.  Keep all follow-up visits as told by your health care provider. This is important.  Make sure to: ? Empty your bladder often and completely. Do not hold urine for long periods of time. ? Empty your bladder before and after sex. ? Wipe from front to back after a bowel movement if you are female. Use each tissue one time when you   wipe. Contact a health care provider if:  You have back pain.  You have a fever.  You feel nauseous or vomit.  Your symptoms do not get better after 3 days.  Your symptoms go away and then return. Get help right away if:  You have severe back pain or lower abdominal pain.  You are vomiting and cannot keep down any medicines or water. This information is not intended to replace advice given to you by  your health care provider. Make sure you discuss any questions you have with your health care provider. Document Released: 06/03/2005 Document Revised: 02/05/2016 Document Reviewed: 07/15/2015 Elsevier Interactive Patient Education  2018 Elsevier Inc.   

## 2023-11-26 NOTE — Progress Notes (Signed)
    Subjective:    Patient ID: Sylvia Fitzpatrick, female    DOB: Jun 05, 1981, 43 y.o.   MRN: [id number]      HPI Sylvia Fitzpatrick is here for  Chief Complaint  Patient presents with   Urinary Tract Infection    Urine frequency, pain and some pressure; Started 2 weeks ago     Symptoms stared 2 weeks ago.  She states frequent urination, some urgency, lower back discomfort and lower abdominal discomfort.  She denies any pain with urination, difficulty urinating and blood in the urine.  She does have a history of urinary tract infections.  She is not pregnant.    Medications and allergies reviewed with patient and updated if appropriate.  Current Outpatient Medications on File Prior to Visit  Medication Sig Dispense Refill   Vitamin D, Ergocalciferol, (DRISDOL) 1.25 MG (50000 UNIT) CAPS capsule Take 1 capsule (50,000 Units total) by mouth every 7 (seven) days. 12 capsule 0   pantoprazole (PROTONIX) 40 MG tablet Take 1 tablet by mouth once a day for 8 weeks (Patient not taking: Reported on 11/26/2023) 60 tablet 0   Peppermint Oil (IBGARD) 90 MG CPCR Use as directed (Patient not taking: Reported on 11/26/2023)     SYRINGE-NEEDLE, DISP, 3 ML 22G X 1" 3 ML MISC Use as directed (Patient not taking: Reported on 11/26/2023) 10 each 0   No current facility-administered medications on file prior to visit.    Review of Systems  Constitutional:  Negative for fever.  Gastrointestinal:  Positive for abdominal pain (uncoomfortable). Negative for nausea.  Genitourinary:  Positive for frequency and urgency. Negative for difficulty urinating, dysuria and hematuria.  Musculoskeletal:  Positive for back pain.       Objective:   Vitals:   11/26/23 0803  BP: 118/70  Pulse: 80  Temp: 98 F (36.7 C)  SpO2: 99%   BP Readings from Last 3 Encounters:  11/26/23 118/70  07/21/23 120/78  05/25/23 120/68   Wt Readings from Last 3 Encounters:  11/26/23 204 lb (92.5 kg)  07/21/23 206 lb (93.4 kg)  05/25/23  202 lb (91.6 kg)   Body mass index is 31.02 kg/m.    Physical Exam Constitutional:      General: She is not in acute distress.    Appearance: Normal appearance. She is not ill-appearing.  HENT:     Head: Normocephalic.  Eyes:     Conjunctiva/sclera: Conjunctivae normal.  Abdominal:     General: There is no distension.     Palpations: Abdomen is soft.     Tenderness: There is no abdominal tenderness. There is no right CVA tenderness, left CVA tenderness, guarding or rebound.  Skin:    General: Skin is warm and dry.  Neurological:     Mental Status: She is alert.            Assessment & Plan:    Acute cystitis: Acute Urine dip consistent with UTI Will send urine for culture Take the antibiotic as prescribed.  Bactrim DS twice daily x 5 days Take tylenol if needed.   Increase your water intake.  Call if no improvement

## 2023-11-28 ENCOUNTER — Encounter: Payer: Self-pay | Admitting: Internal Medicine

## 2023-11-28 LAB — CULTURE, URINE COMPREHENSIVE

## 2024-03-27 ENCOUNTER — Encounter: Payer: Self-pay | Admitting: Family Medicine

## 2024-03-27 ENCOUNTER — Other Ambulatory Visit (INDEPENDENT_AMBULATORY_CARE_PROVIDER_SITE_OTHER)

## 2024-03-27 ENCOUNTER — Ambulatory Visit: Payer: Self-pay | Admitting: Family Medicine

## 2024-03-27 ENCOUNTER — Ambulatory Visit: Admitting: Family Medicine

## 2024-03-27 VITALS — BP 114/68 | HR 70 | Temp 98.5°F | Resp 18 | Ht 68.0 in | Wt 202.0 lb

## 2024-03-27 DIAGNOSIS — E559 Vitamin D deficiency, unspecified: Secondary | ICD-10-CM

## 2024-03-27 DIAGNOSIS — R42 Dizziness and giddiness: Secondary | ICD-10-CM

## 2024-03-27 DIAGNOSIS — R519 Headache, unspecified: Secondary | ICD-10-CM

## 2024-03-27 DIAGNOSIS — R350 Frequency of micturition: Secondary | ICD-10-CM

## 2024-03-27 LAB — COMPREHENSIVE METABOLIC PANEL WITH GFR
ALT: 8 U/L (ref 0–35)
AST: 16 U/L (ref 0–37)
Albumin: 4.4 g/dL (ref 3.5–5.2)
Alkaline Phosphatase: 72 U/L (ref 39–117)
BUN: 7 mg/dL (ref 6–23)
CO2: 27 meq/L (ref 19–32)
Calcium: 9.2 mg/dL (ref 8.4–10.5)
Chloride: 104 meq/L (ref 96–112)
Creatinine, Ser: 0.86 mg/dL (ref 0.40–1.20)
GFR: 83.25 mL/min (ref >60.00)
Glucose, Bld: 94 mg/dL (ref 70–99)
Potassium: 3.9 meq/L (ref 3.5–5.1)
Sodium: 138 meq/L (ref 135–145)
Total Bilirubin: 1.2 mg/dL (ref 0.2–1.2)
Total Protein: 7.9 g/dL (ref 6.0–8.3)

## 2024-03-27 LAB — POCT URINALYSIS DIPSTICK
Bilirubin, UA: NEGATIVE
Blood, UA: NEGATIVE
Glucose, UA: NEGATIVE
Ketones, UA: NEGATIVE
Nitrite, UA: NEGATIVE
Odor: POSITIVE
Protein, UA: NEGATIVE
Spec Grav, UA: 1.01 (ref 1.010–1.025)
Urobilinogen, UA: NEGATIVE U/dL — AB
pH, UA: 6 (ref 5.0–8.0)

## 2024-03-27 LAB — CBC WITH DIFFERENTIAL/PLATELET
Basophils Absolute: 0.1 K/uL (ref 0.0–0.1)
Basophils Relative: 0.8 % (ref 0.0–3.0)
Eosinophils Absolute: 0.1 K/uL (ref 0.0–0.7)
Eosinophils Relative: 1.9 % (ref 0.0–5.0)
HCT: 34.7 % — ABNORMAL LOW (ref 36.0–46.0)
Hemoglobin: 11.4 g/dL — ABNORMAL LOW (ref 12.0–15.0)
Lymphocytes Relative: 36.4 % (ref 12.0–46.0)
Lymphs Abs: 2.6 K/uL (ref 0.7–4.0)
MCHC: 32.8 g/dL (ref 30.0–36.0)
MCV: 81.9 fl (ref 78.0–100.0)
Monocytes Absolute: 0.7 K/uL (ref 0.1–1.0)
Monocytes Relative: 9.6 % (ref 3.0–12.0)
Neutro Abs: 3.6 K/uL (ref 1.4–7.7)
Neutrophils Relative %: 51.3 % (ref 43.0–77.0)
Platelets: 343 K/uL (ref 150.0–400.0)
RBC: 4.24 Mil/uL (ref 3.87–5.11)
RDW: 13.1 % (ref 11.5–15.5)
WBC: 7.1 K/uL (ref 4.0–10.5)

## 2024-03-27 LAB — VITAMIN D 25 HYDROXY (VIT D DEFICIENCY, FRACTURES): VITD: 29.5 ng/mL — ABNORMAL LOW (ref 30.00–100.00)

## 2024-03-27 LAB — VITAMIN B12: Vitamin B-12: 325 pg/mL (ref 211–911)

## 2024-03-27 LAB — TSH: TSH: 0.71 u[IU]/mL (ref 0.35–5.50)

## 2024-03-27 LAB — POC COVID19 BINAXNOW: SARS Coronavirus 2 Ag: NEGATIVE

## 2024-03-27 NOTE — Progress Notes (Signed)
 Assessment & Plan:  1. Dizziness (Primary) - Vitamin B12 - CBC with Differential/Platelet - Comprehensive metabolic panel with GFR - TSH  2. Acute nonintractable headache, unspecified headache type - POC COVID-19 BinaxNow: negative  3. Urine frequency - POCT urinalysis dipstick - Urine Culture; Future  4. Vitamin D  deficiency - VITAMIN D  25 Hydroxy (Vit-D Deficiency, Fractures)   Discussed her symptoms may also be the beginning of a viral illness. Explained typically duration and progression of viral illnesses.    Follow up plan: Return if symptoms worsen or fail to improve.  Niki Rung, MSN, APRN, FNP-C  Subjective:  HPI: Sylvia Fitzpatrick is a 43 y.o. female presenting on 03/27/2024 for Dizziness (Light headed and HA at times - worse when standing long periods of time - gets better with rest) and Back Pain (Lower back - some urine urgency and freqency)  Patient reports yesterday she started experiencing dizziness, fatigue, headaches, sneezing, low back pain, urinary frequency and urgency. She does feel slightly better after resting.     ROS: Negative unless specifically indicated above in HPI.   Relevant past medical history reviewed and updated as indicated.   Allergies and medications reviewed and updated.   Current Outpatient Medications:    cholecalciferol (VITAMIN D3) 25 MCG (1000 UNIT) tablet, Take 1,000 Units by mouth daily., Disp: , Rfl:    Vitamin D , Ergocalciferol , (DRISDOL ) 1.25 MG (50000 UNIT) CAPS capsule, Take 1 capsule (50,000 Units total) by mouth every 7 (seven) days., Disp: 12 capsule, Rfl: 0  No Known Allergies  Objective:   BP 114/68   Pulse 70   Temp 98.5 F (36.9 C)   Resp 18   Ht 5' 8 (1.727 m)   Wt 202 lb (91.6 kg)   LMP 03/06/2024 (Approximate)   BMI 30.71 kg/m    Physical Exam Vitals reviewed.  Constitutional:      General: She is not in acute distress.    Appearance: Normal appearance. She is not ill-appearing,  toxic-appearing or diaphoretic.  HENT:     Head: Normocephalic and atraumatic.     Right Ear: Tympanic membrane, ear canal and external ear normal. There is no impacted cerumen.     Left Ear: Tympanic membrane, ear canal and external ear normal. There is no impacted cerumen.     Nose: Nose normal. No congestion or rhinorrhea.     Right Turbinates: Swollen and pale.     Left Turbinates: Swollen and pale.     Right Sinus: No maxillary sinus tenderness or frontal sinus tenderness.     Left Sinus: No maxillary sinus tenderness or frontal sinus tenderness.     Mouth/Throat:     Mouth: Mucous membranes are moist.     Pharynx: Oropharynx is clear. No oropharyngeal exudate or posterior oropharyngeal erythema.  Eyes:     General: No scleral icterus.       Right eye: No discharge.        Left eye: No discharge.     Conjunctiva/sclera: Conjunctivae normal.  Cardiovascular:     Rate and Rhythm: Normal rate and regular rhythm.     Heart sounds: Normal heart sounds. No murmur heard.    No friction rub. No gallop.  Pulmonary:     Effort: Pulmonary effort is normal. No respiratory distress.     Breath sounds: Normal breath sounds. No stridor. No wheezing, rhonchi or rales.  Musculoskeletal:        General: Normal range of motion.     Cervical  back: Normal range of motion.  Lymphadenopathy:     Cervical: No cervical adenopathy.  Skin:    General: Skin is warm and dry.     Capillary Refill: Capillary refill takes less than 2 seconds.  Neurological:     General: No focal deficit present.     Mental Status: She is oriented to person, place, and time. Mental status is at baseline. She is lethargic.  Psychiatric:        Mood and Affect: Mood normal.        Behavior: Behavior normal.        Thought Content: Thought content normal.        Judgment: Judgment normal.

## 2024-03-27 NOTE — Addendum Note (Signed)
 Addended by: MERLYNN NIKI FALCON on: 03/27/2024 10:15 AM   Modules accepted: Orders

## 2024-03-28 LAB — URINE CULTURE: Result:: NO GROWTH

## 2024-05-29 ENCOUNTER — Encounter: Admitting: Internal Medicine

## 2024-05-29 ENCOUNTER — Encounter: Payer: Self-pay | Admitting: Internal Medicine

## 2024-05-29 DIAGNOSIS — E559 Vitamin D deficiency, unspecified: Secondary | ICD-10-CM

## 2024-05-29 DIAGNOSIS — E611 Iron deficiency: Secondary | ICD-10-CM

## 2024-05-29 DIAGNOSIS — E538 Deficiency of other specified B group vitamins: Secondary | ICD-10-CM

## 2024-05-31 ENCOUNTER — Other Ambulatory Visit (INDEPENDENT_AMBULATORY_CARE_PROVIDER_SITE_OTHER)

## 2024-05-31 DIAGNOSIS — E538 Deficiency of other specified B group vitamins: Secondary | ICD-10-CM | POA: Diagnosis not present

## 2024-05-31 DIAGNOSIS — E611 Iron deficiency: Secondary | ICD-10-CM

## 2024-05-31 DIAGNOSIS — E559 Vitamin D deficiency, unspecified: Secondary | ICD-10-CM | POA: Diagnosis not present

## 2024-05-31 LAB — CBC
HCT: 32.8 % — ABNORMAL LOW (ref 36.0–46.0)
Hemoglobin: 10.5 g/dL — ABNORMAL LOW (ref 12.0–15.0)
MCHC: 32.1 g/dL (ref 30.0–36.0)
MCV: 81.3 fl (ref 78.0–100.0)
Platelets: 323 K/uL (ref 150.0–400.0)
RBC: 4.03 Mil/uL (ref 3.87–5.11)
RDW: 14.1 % (ref 11.5–15.5)
WBC: 6.1 K/uL (ref 4.0–10.5)

## 2024-05-31 LAB — VITAMIN D 25 HYDROXY (VIT D DEFICIENCY, FRACTURES): VITD: 22.51 ng/mL — ABNORMAL LOW (ref 30.00–100.00)

## 2024-05-31 LAB — FERRITIN: Ferritin: 12.4 ng/mL (ref 10.0–291.0)

## 2024-05-31 LAB — VITAMIN B12: Vitamin B-12: 156 pg/mL — ABNORMAL LOW (ref 211–911)

## 2024-05-31 NOTE — Addendum Note (Signed)
 Addended by: ROLLENE NORRIS A on: 05/31/2024 09:30 AM   Modules accepted: Orders

## 2024-06-02 ENCOUNTER — Other Ambulatory Visit: Payer: Self-pay | Admitting: Internal Medicine

## 2024-06-02 DIAGNOSIS — Z0000 Encounter for general adult medical examination without abnormal findings: Secondary | ICD-10-CM

## 2024-06-05 ENCOUNTER — Ambulatory Visit: Payer: Self-pay | Admitting: Internal Medicine

## 2024-06-06 ENCOUNTER — Encounter: Payer: Self-pay | Admitting: Internal Medicine

## 2024-06-06 ENCOUNTER — Ambulatory Visit (INDEPENDENT_AMBULATORY_CARE_PROVIDER_SITE_OTHER): Admitting: Internal Medicine

## 2024-06-06 VITALS — BP 118/80 | HR 55 | Temp 97.8°F | Ht 68.0 in | Wt 197.0 lb

## 2024-06-06 DIAGNOSIS — E559 Vitamin D deficiency, unspecified: Secondary | ICD-10-CM

## 2024-06-06 DIAGNOSIS — E611 Iron deficiency: Secondary | ICD-10-CM

## 2024-06-06 DIAGNOSIS — Z0000 Encounter for general adult medical examination without abnormal findings: Secondary | ICD-10-CM

## 2024-06-06 DIAGNOSIS — E538 Deficiency of other specified B group vitamins: Secondary | ICD-10-CM | POA: Diagnosis not present

## 2024-06-06 MED ORDER — CYANOCOBALAMIN 1000 MCG/ML IJ SOLN
1000.0000 ug | Freq: Once | INTRAMUSCULAR | Status: AC
  Administered 2024-06-06: 1000 ug via INTRAMUSCULAR

## 2024-06-06 NOTE — Progress Notes (Signed)
   Subjective:   Patient ID: Sylvia Fitzpatrick, female    DOB: 1981/03/07, 42 y.o.   MRN: [id number]  The patient is here for physical. Pertinent topics discussed:  None  PMH, [facility], social history reviewed and updated  Review of Systems  Constitutional: Negative.   HENT: Negative.    Eyes: Negative.   Respiratory:  Negative for cough, chest tightness and shortness of breath.   Cardiovascular:  Negative for chest pain, palpitations and leg swelling.  Gastrointestinal:  Negative for abdominal distention, abdominal pain, constipation, diarrhea, nausea and vomiting.  Musculoskeletal: Negative.   Skin: Negative.   Neurological:  Positive for light-headedness.  Psychiatric/Behavioral: Negative.      Objective:  Physical Exam Constitutional:      Appearance: She is well-developed.  HENT:     Head: Normocephalic and atraumatic.  Cardiovascular:     Rate and Rhythm: Normal rate and regular rhythm.  Pulmonary:     Effort: Pulmonary effort is normal. No respiratory distress.     Breath sounds: Normal breath sounds. No wheezing or rales.  Abdominal:     General: Bowel sounds are normal. There is no distension.     Palpations: Abdomen is soft.     Tenderness: There is no abdominal tenderness.  Musculoskeletal:     Cervical back: Normal range of motion.  Skin:    General: Skin is warm and dry.  Neurological:     Mental Status: She is alert and oriented to person, place, and time.     Coordination: Coordination normal.     Vitals:   06/06/24 0800  BP: 118/80  Pulse: (!) 55  Temp: 97.8 F (36.6 C)  TempSrc: Oral  SpO2: 99%  Weight: 197 lb (89.4 kg)  Height: 5' 8 (1.727 m)    Assessment & Plan:  B12 shot given at visit

## 2024-06-06 NOTE — Assessment & Plan Note (Signed)
 Stopped multivitamin and encouraged to resumed. Offered iron infusion if this is not effective to reduce symptoms of lightheadedness during periods.

## 2024-06-06 NOTE — Assessment & Plan Note (Signed)
 Taking vitamin D  oral otc and encouraged to continue given recent levels.

## 2024-06-06 NOTE — Patient Instructions (Signed)
 If needed voltaren  gel for the wrist.

## 2024-06-06 NOTE — Assessment & Plan Note (Signed)
 Given B12 shot today. Levels rechecked and lower. She had been off oral for a bit but has now resumed.

## 2024-06-06 NOTE — Assessment & Plan Note (Signed)
 Flu shot at work. Tetanus declines. Colonoscopy up to date. Mammogram up to date, pap smear up to date with gyn. Counseled about sun safety and mole surveillance. Counseled about the dangers of distracted driving. Given 10 year screening recommendations.

## 2024-06-13 ENCOUNTER — Ambulatory Visit

## 2024-07-06 ENCOUNTER — Ambulatory Visit

## 2024-07-25 ENCOUNTER — Ambulatory Visit

## 2024-09-04 ENCOUNTER — Encounter: Payer: Self-pay | Admitting: Internal Medicine

## 2024-09-04 DIAGNOSIS — R300 Dysuria: Secondary | ICD-10-CM

## 2024-09-05 ENCOUNTER — Other Ambulatory Visit (INDEPENDENT_AMBULATORY_CARE_PROVIDER_SITE_OTHER)

## 2024-09-05 DIAGNOSIS — R300 Dysuria: Secondary | ICD-10-CM

## 2024-09-05 LAB — URINALYSIS, ROUTINE W REFLEX MICROSCOPIC
Bilirubin Urine: NEGATIVE
Ketones, ur: NEGATIVE
Nitrite: NEGATIVE
Specific Gravity, Urine: 1.02 (ref 1.000–1.030)
Total Protein, Urine: NEGATIVE
Urine Glucose: NEGATIVE
Urobilinogen, UA: 0.2 (ref 0.0–1.0)
pH: 6 (ref 5.0–8.0)

## 2024-09-06 ENCOUNTER — Other Ambulatory Visit (HOSPITAL_COMMUNITY): Payer: Self-pay

## 2024-09-06 ENCOUNTER — Ambulatory Visit: Payer: Self-pay | Admitting: Internal Medicine

## 2024-09-06 MED ORDER — NITROFURANTOIN MONOHYD MACRO 100 MG PO CAPS
100.0000 mg | ORAL_CAPSULE | Freq: Two times a day (BID) | ORAL | 0 refills | Status: AC
  Filled 2024-09-06: qty 10, 5d supply, fill #0

## 2024-09-07 LAB — URINE CULTURE

## 2024-09-24 ENCOUNTER — Encounter: Payer: Self-pay | Admitting: Internal Medicine

## 2024-09-24 DIAGNOSIS — R300 Dysuria: Secondary | ICD-10-CM

## 2024-09-29 ENCOUNTER — Other Ambulatory Visit (INDEPENDENT_AMBULATORY_CARE_PROVIDER_SITE_OTHER)

## 2024-09-29 DIAGNOSIS — R300 Dysuria: Secondary | ICD-10-CM

## 2024-09-29 LAB — URINALYSIS, ROUTINE W REFLEX MICROSCOPIC
Bilirubin Urine: NEGATIVE
Ketones, ur: NEGATIVE
Nitrite: NEGATIVE
Specific Gravity, Urine: 1.01 (ref 1.000–1.030)
Total Protein, Urine: NEGATIVE
Urine Glucose: NEGATIVE
Urobilinogen, UA: 0.2 (ref 0.0–1.0)
pH: 7 (ref 5.0–8.0)

## 2024-09-30 LAB — URINE CULTURE: Result:: NO GROWTH

## 2024-10-03 ENCOUNTER — Ambulatory Visit: Payer: Self-pay | Admitting: Internal Medicine
